# Patient Record
Sex: Male | Born: 1945 | Race: Black or African American | Hispanic: No | Marital: Married | Smoking: Never smoker
Health system: Southern US, Community
[De-identification: ages and names within clinical notes are randomized; demographics above are authoritative.]

## PROBLEM LIST (undated history)

## (undated) DIAGNOSIS — I639 Cerebral infarction, unspecified: Secondary | ICD-10-CM

## (undated) DIAGNOSIS — E785 Hyperlipidemia, unspecified: Secondary | ICD-10-CM

## (undated) DIAGNOSIS — N189 Chronic kidney disease, unspecified: Secondary | ICD-10-CM

## (undated) DIAGNOSIS — K8020 Calculus of gallbladder without cholecystitis without obstruction: Secondary | ICD-10-CM

## (undated) DIAGNOSIS — M1990 Unspecified osteoarthritis, unspecified site: Secondary | ICD-10-CM

## (undated) DIAGNOSIS — C649 Malignant neoplasm of unspecified kidney, except renal pelvis: Secondary | ICD-10-CM

## (undated) DIAGNOSIS — E1140 Type 2 diabetes mellitus with diabetic neuropathy, unspecified: Secondary | ICD-10-CM

## (undated) DIAGNOSIS — K635 Polyp of colon: Secondary | ICD-10-CM

## (undated) DIAGNOSIS — I10 Essential (primary) hypertension: Secondary | ICD-10-CM

## (undated) DIAGNOSIS — N1330 Unspecified hydronephrosis: Secondary | ICD-10-CM

## (undated) DIAGNOSIS — E119 Type 2 diabetes mellitus without complications: Secondary | ICD-10-CM

## (undated) HISTORY — PX: KIDNEY TRANSPLANT: SHX239

## (undated) HISTORY — PX: TONSILLECTOMY: SUR1361

## (undated) HISTORY — PX: CATARACT EXTRACTION: SUR2

## (undated) HISTORY — PX: NEPHRECTOMY: SHX65

## (undated) HISTORY — PX: CHOLECYSTECTOMY: SHX55

## (undated) HISTORY — PX: KNEE ARTHROSCOPY: SUR90

## (undated) HISTORY — PX: WISDOM TOOTH EXTRACTION: SHX21

## (undated) HISTORY — PX: COLONOSCOPY: SHX174

---

## 2004-05-24 ENCOUNTER — Other Ambulatory Visit: Payer: Self-pay

## 2004-09-21 ENCOUNTER — Ambulatory Visit: Payer: Self-pay | Admitting: Nephrology

## 2006-02-27 ENCOUNTER — Ambulatory Visit: Payer: Self-pay | Admitting: Gastroenterology

## 2006-05-15 ENCOUNTER — Ambulatory Visit: Payer: Self-pay | Admitting: Unknown Physician Specialty

## 2006-05-17 ENCOUNTER — Ambulatory Visit: Payer: Self-pay | Admitting: Unknown Physician Specialty

## 2006-05-17 ENCOUNTER — Other Ambulatory Visit: Payer: Self-pay

## 2006-05-20 ENCOUNTER — Ambulatory Visit: Payer: Self-pay | Admitting: Unknown Physician Specialty

## 2006-06-21 ENCOUNTER — Ambulatory Visit: Payer: Self-pay | Admitting: Unknown Physician Specialty

## 2006-12-11 ENCOUNTER — Encounter: Payer: Self-pay | Admitting: Transplant Surgery

## 2006-12-28 ENCOUNTER — Encounter: Payer: Self-pay | Admitting: Transplant Surgery

## 2007-01-28 ENCOUNTER — Encounter: Payer: Self-pay | Admitting: Transplant Surgery

## 2007-02-27 ENCOUNTER — Encounter: Payer: Self-pay | Admitting: Transplant Surgery

## 2009-08-23 ENCOUNTER — Ambulatory Visit: Payer: Self-pay | Admitting: Gastroenterology

## 2010-03-22 ENCOUNTER — Ambulatory Visit: Payer: Self-pay | Admitting: Internal Medicine

## 2010-03-28 ENCOUNTER — Other Ambulatory Visit: Payer: Self-pay

## 2010-04-09 ENCOUNTER — Emergency Department: Payer: Self-pay | Admitting: Emergency Medicine

## 2010-04-12 ENCOUNTER — Ambulatory Visit: Payer: Self-pay | Admitting: Emergency Medicine

## 2010-11-13 ENCOUNTER — Emergency Department: Payer: Self-pay | Admitting: Unknown Physician Specialty

## 2011-02-12 ENCOUNTER — Other Ambulatory Visit: Payer: Self-pay

## 2011-02-27 ENCOUNTER — Other Ambulatory Visit: Payer: Self-pay

## 2011-03-30 ENCOUNTER — Other Ambulatory Visit: Payer: Self-pay

## 2011-05-29 ENCOUNTER — Other Ambulatory Visit: Payer: Self-pay

## 2011-05-30 ENCOUNTER — Other Ambulatory Visit: Payer: Self-pay

## 2011-06-30 ENCOUNTER — Other Ambulatory Visit: Payer: Self-pay

## 2011-07-30 ENCOUNTER — Other Ambulatory Visit: Payer: Self-pay

## 2011-08-29 ENCOUNTER — Emergency Department: Payer: Self-pay | Admitting: *Deleted

## 2011-09-28 ENCOUNTER — Other Ambulatory Visit: Payer: Self-pay

## 2011-09-29 ENCOUNTER — Other Ambulatory Visit: Payer: Self-pay

## 2011-10-30 ENCOUNTER — Other Ambulatory Visit: Payer: Self-pay

## 2011-11-27 LAB — CBC WITH DIFFERENTIAL/PLATELET
Basophil #: 0 10*3/uL (ref 0.0–0.1)
HGB: 13.6 g/dL (ref 13.0–18.0)
Lymphocyte #: 1 10*3/uL (ref 1.0–3.6)
Lymphocyte %: 22.9 %
MCH: 26.9 pg (ref 26.0–34.0)
MCHC: 32.6 g/dL (ref 32.0–36.0)
MCV: 82 fL (ref 80–100)
Monocyte #: 0.8 10*3/uL — ABNORMAL HIGH (ref 0.0–0.7)
Monocyte %: 17.8 %
Neutrophil %: 57.4 %
Platelet: 172 10*3/uL (ref 150–440)
RBC: 5.07 10*6/uL (ref 4.40–5.90)
RDW: 13 % (ref 11.5–14.5)

## 2011-11-27 LAB — BASIC METABOLIC PANEL
BUN: 31 mg/dL — ABNORMAL HIGH (ref 7–18)
Chloride: 103 mmol/L (ref 98–107)
Creatinine: 1.41 mg/dL — ABNORMAL HIGH (ref 0.60–1.30)
EGFR (Non-African Amer.): 53 — ABNORMAL LOW
Glucose: 82 mg/dL (ref 65–99)
Potassium: 4.9 mmol/L (ref 3.5–5.1)
Sodium: 140 mmol/L (ref 136–145)

## 2011-11-27 LAB — PHOSPHORUS: Phosphorus: 3.2 mg/dL (ref 2.5–4.9)

## 2011-11-27 LAB — MAGNESIUM: Magnesium: 2.1 mg/dL

## 2011-11-30 ENCOUNTER — Other Ambulatory Visit: Payer: Self-pay

## 2012-01-30 ENCOUNTER — Other Ambulatory Visit: Payer: Self-pay

## 2012-01-30 LAB — CBC WITH DIFFERENTIAL/PLATELET
Basophil #: 0 10*3/uL (ref 0.0–0.1)
Basophil %: 0.7 %
Eosinophil #: 0.1 10*3/uL (ref 0.0–0.7)
Eosinophil %: 1.2 %
Lymphocyte #: 0.7 10*3/uL — ABNORMAL LOW (ref 1.0–3.6)
Lymphocyte %: 17.6 %
MCV: 83 fL (ref 80–100)
Monocyte %: 13.7 %
Neutrophil #: 2.8 10*3/uL (ref 1.4–6.5)
Platelet: 179 10*3/uL (ref 150–440)
RBC: 5.01 10*6/uL (ref 4.40–5.90)
RDW: 12.9 % (ref 11.5–14.5)
WBC: 4.2 10*3/uL (ref 3.8–10.6)

## 2012-01-30 LAB — BASIC METABOLIC PANEL
Calcium, Total: 9.6 mg/dL (ref 8.5–10.1)
Chloride: 103 mmol/L (ref 98–107)
Creatinine: 1.31 mg/dL — ABNORMAL HIGH (ref 0.60–1.30)
EGFR (African American): >60
EGFR (Non-African Amer.): 58 — ABNORMAL LOW
Sodium: 139 mmol/L (ref 136–145)

## 2012-01-30 LAB — LIPID PANEL
Cholesterol: 110 mg/dL (ref 0–200)
Ldl Cholesterol, Calc: 32 mg/dL (ref 0–100)

## 2012-01-30 LAB — HEPATIC FUNCTION PANEL A (ARMC)
Albumin: 4.3 g/dL (ref 3.4–5.0)
Bilirubin, Direct: 0.1 mg/dL (ref 0.00–0.20)
Bilirubin,Total: 0.5 mg/dL (ref 0.2–1.0)
SGPT (ALT): 19 U/L
Total Protein: 8 g/dL (ref 6.4–8.2)

## 2012-02-19 LAB — CBC WITH DIFFERENTIAL/PLATELET
Eosinophil #: 0 10*3/uL (ref 0.0–0.7)
HGB: 13.1 g/dL (ref 13.0–18.0)
Lymphocyte #: 0.9 10*3/uL — ABNORMAL LOW (ref 1.0–3.6)
Lymphocyte %: 21.5 %
MCHC: 31.6 g/dL — ABNORMAL LOW (ref 32.0–36.0)
MCV: 83 fL (ref 80–100)
Monocyte #: 0.6 x10 3/mm (ref 0.2–1.0)
Monocyte %: 14.7 %
Neutrophil #: 2.5 10*3/uL (ref 1.4–6.5)
Neutrophil %: 62 %
Platelet: 170 10*3/uL (ref 150–440)
RDW: 13.9 % (ref 11.5–14.5)
WBC: 4 10*3/uL (ref 3.8–10.6)

## 2012-02-19 LAB — BASIC METABOLIC PANEL
Anion Gap: 8 (ref 7–16)
BUN: 31 mg/dL — ABNORMAL HIGH (ref 7–18)
Chloride: 103 mmol/L (ref 98–107)
Co2: 26 mmol/L (ref 21–32)
EGFR (Non-African Amer.): 52 — ABNORMAL LOW
Glucose: 122 mg/dL — ABNORMAL HIGH (ref 65–99)
Potassium: 4.6 mmol/L (ref 3.5–5.1)
Sodium: 137 mmol/L (ref 136–145)

## 2012-02-19 LAB — PHOSPHORUS: Phosphorus: 2.4 mg/dL — ABNORMAL LOW (ref 2.5–4.9)

## 2012-02-27 ENCOUNTER — Other Ambulatory Visit: Payer: Self-pay

## 2012-03-26 LAB — LIPID PANEL
Cholesterol: 101 mg/dL (ref 0–200)
HDL Cholesterol: 53 mg/dL (ref 40–60)
Ldl Cholesterol, Calc: 31 mg/dL (ref 0–100)
VLDL Cholesterol, Calc: 17 mg/dL (ref 5–40)

## 2012-03-26 LAB — CBC WITH DIFFERENTIAL/PLATELET
Basophil %: 0.6 %
Eosinophil #: 0 10*3/uL (ref 0.0–0.7)
Eosinophil %: 0.6 %
Lymphocyte #: 0.7 10*3/uL — ABNORMAL LOW (ref 1.0–3.6)
Lymphocyte %: 17.1 %
MCH: 26.4 pg (ref 26.0–34.0)
Monocyte #: 0.5 x10 3/mm (ref 0.2–1.0)
Monocyte %: 12.7 %
Neutrophil %: 69 %
Platelet: 174 10*3/uL (ref 150–440)
RDW: 14 % (ref 11.5–14.5)
WBC: 4.2 10*3/uL (ref 3.8–10.6)

## 2012-03-26 LAB — BASIC METABOLIC PANEL
Anion Gap: 11 (ref 7–16)
BUN: 28 mg/dL — ABNORMAL HIGH (ref 7–18)
Calcium, Total: 9.4 mg/dL (ref 8.5–10.1)
Chloride: 104 mmol/L (ref 98–107)
Creatinine: 1.37 mg/dL — ABNORMAL HIGH (ref 0.60–1.30)
EGFR (African American): >60
EGFR (Non-African Amer.): 53 — ABNORMAL LOW
Glucose: 110 mg/dL — ABNORMAL HIGH (ref 65–99)
Osmolality: 282 (ref 275–301)
Potassium: 4 mmol/L (ref 3.5–5.1)

## 2012-03-26 LAB — HEPATIC FUNCTION PANEL A (ARMC)
Alkaline Phosphatase: 69 U/L (ref 50–136)
Bilirubin,Total: 0.4 mg/dL (ref 0.2–1.0)
SGOT(AST): 22 U/L (ref 15–37)
SGPT (ALT): 18 U/L
Total Protein: 7.9 g/dL (ref 6.4–8.2)

## 2012-03-26 LAB — PHOSPHORUS: Phosphorus: 3.3 mg/dL (ref 2.5–4.9)

## 2012-03-26 LAB — MAGNESIUM: Magnesium: 1.9 mg/dL

## 2012-03-29 ENCOUNTER — Other Ambulatory Visit: Payer: Self-pay

## 2012-05-20 ENCOUNTER — Other Ambulatory Visit: Payer: Self-pay

## 2012-05-20 LAB — CBC WITH DIFFERENTIAL/PLATELET
Basophil #: 0 10*3/uL (ref 0.0–0.1)
Basophil %: 0.9 %
Eosinophil %: 1.1 %
HCT: 41.8 % (ref 40.0–52.0)
HGB: 13.5 g/dL (ref 13.0–18.0)
Lymphocyte #: 0.8 10*3/uL — ABNORMAL LOW (ref 1.0–3.6)
Lymphocyte %: 21 %
MCHC: 32.4 g/dL (ref 32.0–36.0)
MCV: 82 fL (ref 80–100)
Monocyte #: 0.6 x10 3/mm (ref 0.2–1.0)
Neutrophil %: 62.7 %
Platelet: 166 10*3/uL (ref 150–440)
RBC: 5.11 10*6/uL (ref 4.40–5.90)
RDW: 14.2 % (ref 11.5–14.5)
WBC: 3.9 10*3/uL (ref 3.8–10.6)

## 2012-05-20 LAB — BASIC METABOLIC PANEL
BUN: 25 mg/dL — ABNORMAL HIGH (ref 7–18)
Calcium, Total: 9.1 mg/dL (ref 8.5–10.1)
EGFR (Non-African Amer.): >60
Glucose: 123 mg/dL — ABNORMAL HIGH (ref 65–99)
Osmolality: 281 (ref 275–301)
Potassium: 4.1 mmol/L (ref 3.5–5.1)
Sodium: 138 mmol/L (ref 136–145)

## 2012-05-20 LAB — PHOSPHORUS: Phosphorus: 2.6 mg/dL (ref 2.5–4.9)

## 2012-05-29 ENCOUNTER — Other Ambulatory Visit: Payer: Self-pay

## 2012-06-27 LAB — BASIC METABOLIC PANEL
Anion Gap: 7 (ref 7–16)
BUN: 19 mg/dL — ABNORMAL HIGH (ref 7–18)
Chloride: 109 mmol/L — ABNORMAL HIGH (ref 98–107)
Co2: 26 mmol/L (ref 21–32)
Creatinine: 1.1 mg/dL (ref 0.60–1.30)
Glucose: 72 mg/dL (ref 65–99)
Osmolality: 284 (ref 275–301)
Potassium: 4.3 mmol/L (ref 3.5–5.1)
Sodium: 142 mmol/L (ref 136–145)

## 2012-06-27 LAB — CBC WITH DIFFERENTIAL/PLATELET
Basophil #: 0 10*3/uL (ref 0.0–0.1)
Eosinophil #: 0 10*3/uL (ref 0.0–0.7)
Eosinophil %: 1.3 %
HCT: 41 % (ref 40.0–52.0)
Lymphocyte #: 0.8 10*3/uL — ABNORMAL LOW (ref 1.0–3.6)
MCV: 82 fL (ref 80–100)
Monocyte %: 17.1 %
Neutrophil %: 56.4 %
Platelet: 157 10*3/uL (ref 150–440)
RDW: 14.6 % — ABNORMAL HIGH (ref 11.5–14.5)
WBC: 3.5 10*3/uL — ABNORMAL LOW (ref 3.8–10.6)

## 2012-06-27 LAB — PHOSPHORUS: Phosphorus: 2.8 mg/dL (ref 2.5–4.9)

## 2012-06-29 ENCOUNTER — Other Ambulatory Visit: Payer: Self-pay

## 2012-07-22 LAB — LIPID PANEL
HDL Cholesterol: 58 mg/dL (ref 40–60)
Ldl Cholesterol, Calc: 39 mg/dL (ref 0–100)
Triglycerides: 108 mg/dL (ref 0–200)

## 2012-07-22 LAB — CBC WITH DIFFERENTIAL/PLATELET
Basophil %: 0.6 %
Eosinophil #: 0 10*3/uL (ref 0.0–0.7)
HCT: 42.7 % (ref 40.0–52.0)
HGB: 14.1 g/dL (ref 13.0–18.0)
Lymphocyte #: 0.8 10*3/uL — ABNORMAL LOW (ref 1.0–3.6)
Lymphocyte %: 17.8 %
MCH: 26.8 pg (ref 26.0–34.0)
MCHC: 32.9 g/dL (ref 32.0–36.0)
MCV: 81 fL (ref 80–100)
Monocyte #: 0.6 x10 3/mm (ref 0.2–1.0)
Neutrophil #: 3.2 10*3/uL (ref 1.4–6.5)
Platelet: 174 10*3/uL (ref 150–440)

## 2012-07-22 LAB — BASIC METABOLIC PANEL
Anion Gap: 12 (ref 7–16)
BUN: 27 mg/dL — ABNORMAL HIGH (ref 7–18)
Calcium, Total: 9.9 mg/dL (ref 8.5–10.1)
EGFR (African American): >60
EGFR (Non-African Amer.): 60 — ABNORMAL LOW
Glucose: 131 mg/dL — ABNORMAL HIGH (ref 65–99)
Osmolality: 284 (ref 275–301)
Potassium: 4.1 mmol/L (ref 3.5–5.1)

## 2012-07-22 LAB — HEPATIC FUNCTION PANEL A (ARMC)
Albumin: 4.3 g/dL (ref 3.4–5.0)
Alkaline Phosphatase: 83 U/L (ref 50–136)
Bilirubin, Direct: 0.2 mg/dL (ref 0.00–0.20)
Bilirubin,Total: 0.5 mg/dL (ref 0.2–1.0)
Total Protein: 7.6 g/dL (ref 6.4–8.2)

## 2012-07-22 LAB — MAGNESIUM: Magnesium: 1.8 mg/dL

## 2012-07-29 ENCOUNTER — Other Ambulatory Visit: Payer: Self-pay

## 2012-09-24 ENCOUNTER — Other Ambulatory Visit: Payer: Self-pay | Admitting: Nephrology

## 2012-09-24 LAB — BASIC METABOLIC PANEL
Anion Gap: 6 — ABNORMAL LOW (ref 7–16)
BUN: 24 mg/dL — ABNORMAL HIGH (ref 7–18)
Calcium, Total: 9.3 mg/dL (ref 8.5–10.1)
Chloride: 103 mmol/L (ref 98–107)
Creatinine: 1.21 mg/dL (ref 0.60–1.30)
EGFR (African American): >60
Glucose: 120 mg/dL — ABNORMAL HIGH (ref 65–99)
Osmolality: 275 (ref 275–301)

## 2012-09-24 LAB — PHOSPHORUS: Phosphorus: 2.6 mg/dL (ref 2.5–4.9)

## 2012-09-24 LAB — CBC WITH DIFFERENTIAL/PLATELET
Basophil #: 0 10*3/uL (ref 0.0–0.1)
Eosinophil #: 0 10*3/uL (ref 0.0–0.7)
HCT: 43.4 % (ref 40.0–52.0)
Lymphocyte #: 0.9 10*3/uL — ABNORMAL LOW (ref 1.0–3.6)
Lymphocyte %: 21.2 %
MCHC: 32.8 g/dL (ref 32.0–36.0)
MCV: 81 fL (ref 80–100)
Monocyte %: 17 %
Neutrophil #: 2.4 10*3/uL (ref 1.4–6.5)
Neutrophil %: 60 %
Platelet: 172 10*3/uL (ref 150–440)
RBC: 5.38 10*6/uL (ref 4.40–5.90)
RDW: 14 % (ref 11.5–14.5)

## 2012-09-24 LAB — MAGNESIUM: Magnesium: 1.8 mg/dL

## 2012-09-28 ENCOUNTER — Other Ambulatory Visit: Payer: Self-pay | Admitting: Nephrology

## 2012-10-17 LAB — CBC WITH DIFFERENTIAL/PLATELET
Basophil %: 1.1 %
Eosinophil #: 0.1 10*3/uL (ref 0.0–0.7)
Eosinophil %: 1.7 %
HCT: 43.4 % (ref 40.0–52.0)
MCHC: 32.6 g/dL (ref 32.0–36.0)
MCV: 81 fL (ref 80–100)
Monocyte %: 17.8 %
Neutrophil %: 54.1 %
RDW: 14 % (ref 11.5–14.5)

## 2012-10-17 LAB — COMPREHENSIVE METABOLIC PANEL
Albumin: 4.1 g/dL (ref 3.4–5.0)
Alkaline Phosphatase: 85 U/L (ref 50–136)
BUN: 28 mg/dL — ABNORMAL HIGH (ref 7–18)
Bilirubin,Total: 0.5 mg/dL (ref 0.2–1.0)
Calcium, Total: 9.6 mg/dL (ref 8.5–10.1)
Chloride: 104 mmol/L (ref 98–107)
Creatinine: 1.16 mg/dL (ref 0.60–1.30)
EGFR (Non-African Amer.): >60
Glucose: 96 mg/dL (ref 65–99)
Osmolality: 279 (ref 275–301)
Potassium: 4.2 mmol/L (ref 3.5–5.1)
SGPT (ALT): 20 U/L (ref 12–78)
Total Protein: 7.6 g/dL (ref 6.4–8.2)

## 2012-10-17 LAB — LIPID PANEL
Ldl Cholesterol, Calc: 27 mg/dL (ref 0–100)
Triglycerides: 110 mg/dL (ref 0–200)

## 2012-10-17 LAB — BILIRUBIN, DIRECT: Bilirubin, Direct: 0.1 mg/dL (ref 0.00–0.20)

## 2012-10-17 LAB — GAMMA GT: GGT: 20 U/L (ref 5–85)

## 2012-10-17 LAB — MAGNESIUM: Magnesium: 1.6 mg/dL — ABNORMAL LOW

## 2012-10-17 LAB — PHOSPHORUS: Phosphorus: 3.2 mg/dL (ref 2.5–4.9)

## 2012-10-29 ENCOUNTER — Other Ambulatory Visit: Payer: Self-pay | Admitting: Nephrology

## 2012-11-19 LAB — CBC WITH DIFFERENTIAL/PLATELET
Basophil #: 0 10*3/uL (ref 0.0–0.1)
Basophil %: 0.8 %
Eosinophil #: 0.1 10*3/uL (ref 0.0–0.7)
Eosinophil %: 1.9 %
HCT: 44 % (ref 40.0–52.0)
Lymphocyte %: 14.8 %
MCH: 26.2 pg (ref 26.0–34.0)
MCHC: 32.4 g/dL (ref 32.0–36.0)
Monocyte #: 1.2 x10 3/mm — ABNORMAL HIGH (ref 0.2–1.0)
Neutrophil #: 3.6 10*3/uL (ref 1.4–6.5)
Platelet: 165 10*3/uL (ref 150–440)
RDW: 14.3 % (ref 11.5–14.5)
WBC: 5.8 10*3/uL (ref 3.8–10.6)

## 2012-11-19 LAB — BASIC METABOLIC PANEL
Anion Gap: 7 (ref 7–16)
Co2: 27 mmol/L (ref 21–32)
Creatinine: 1.11 mg/dL (ref 0.60–1.30)
EGFR (African American): >60
Glucose: 85 mg/dL (ref 65–99)
Osmolality: 275 (ref 275–301)
Potassium: 3.9 mmol/L (ref 3.5–5.1)
Sodium: 137 mmol/L (ref 136–145)

## 2012-11-19 LAB — MAGNESIUM: Magnesium: 1.7 mg/dL — ABNORMAL LOW

## 2012-11-29 ENCOUNTER — Other Ambulatory Visit: Payer: Self-pay | Admitting: Nephrology

## 2013-01-19 ENCOUNTER — Other Ambulatory Visit: Payer: Self-pay | Admitting: Nephrology

## 2013-01-19 LAB — CBC WITH DIFFERENTIAL/PLATELET
Basophil %: 1.2 %
Eosinophil %: 1.3 %
HCT: 43.5 % (ref 40.0–52.0)
HGB: 13.8 g/dL (ref 13.0–18.0)
Lymphocyte #: 0.9 10*3/uL — ABNORMAL LOW (ref 1.0–3.6)
Lymphocyte %: 18.7 %
MCHC: 31.8 g/dL — ABNORMAL LOW (ref 32.0–36.0)
MCV: 80 fL (ref 80–100)
Monocyte %: 14.9 %
Neutrophil %: 63.9 %
RBC: 5.41 10*6/uL (ref 4.40–5.90)
WBC: 4.6 10*3/uL (ref 3.8–10.6)

## 2013-01-19 LAB — PHOSPHORUS: Phosphorus: 2.8 mg/dL (ref 2.5–4.9)

## 2013-01-19 LAB — BASIC METABOLIC PANEL
BUN: 35 mg/dL — ABNORMAL HIGH (ref 7–18)
Calcium, Total: 9.2 mg/dL (ref 8.5–10.1)
Co2: 29 mmol/L (ref 21–32)
Glucose: 107 mg/dL — ABNORMAL HIGH (ref 65–99)
Osmolality: 279 (ref 275–301)
Potassium: 4.6 mmol/L (ref 3.5–5.1)
Sodium: 135 mmol/L — ABNORMAL LOW (ref 136–145)

## 2013-01-27 ENCOUNTER — Other Ambulatory Visit: Payer: Self-pay | Admitting: Nephrology

## 2013-02-12 LAB — CBC WITH DIFFERENTIAL/PLATELET
Eosinophil %: 1.4 %
HCT: 42.4 % (ref 40.0–52.0)
HGB: 13.6 g/dL (ref 13.0–18.0)
Lymphocyte #: 0.9 10*3/uL — ABNORMAL LOW (ref 1.0–3.6)
Lymphocyte %: 20.3 %
MCH: 25.8 pg — ABNORMAL LOW (ref 26.0–34.0)
MCHC: 32 g/dL (ref 32.0–36.0)
Monocyte #: 0.8 x10 3/mm (ref 0.2–1.0)
Monocyte %: 16.3 %
Neutrophil #: 2.8 10*3/uL (ref 1.4–6.5)
RDW: 14.9 % — ABNORMAL HIGH (ref 11.5–14.5)

## 2013-02-12 LAB — COMPREHENSIVE METABOLIC PANEL
Albumin: 3.9 g/dL (ref 3.4–5.0)
Alkaline Phosphatase: 74 U/L (ref 50–136)
Anion Gap: 4 — ABNORMAL LOW (ref 7–16)
Bilirubin,Total: 0.6 mg/dL (ref 0.2–1.0)
Creatinine: 1.08 mg/dL (ref 0.60–1.30)
EGFR (African American): >60
Osmolality: 276 (ref 275–301)
Potassium: 4.3 mmol/L (ref 3.5–5.1)
SGPT (ALT): 20 U/L (ref 12–78)
Total Protein: 7.6 g/dL (ref 6.4–8.2)

## 2013-02-12 LAB — BILIRUBIN, DIRECT: Bilirubin, Direct: 0.2 mg/dL (ref 0.00–0.20)

## 2013-02-12 LAB — LIPID PANEL
Cholesterol: 93 mg/dL (ref 0–200)
Triglycerides: 67 mg/dL (ref 0–200)
VLDL Cholesterol, Calc: 13 mg/dL (ref 5–40)

## 2013-02-12 LAB — MAGNESIUM: Magnesium: 1.8 mg/dL

## 2013-02-26 ENCOUNTER — Other Ambulatory Visit: Payer: Self-pay | Admitting: Nephrology

## 2013-03-16 LAB — CBC WITH DIFFERENTIAL/PLATELET
Basophil #: 0 10*3/uL (ref 0.0–0.1)
Basophil %: 0.3 %
Lymphocyte #: 0.9 10*3/uL — ABNORMAL LOW (ref 1.0–3.6)
Monocyte #: 0.7 x10 3/mm (ref 0.2–1.0)
Neutrophil #: 2 10*3/uL (ref 1.4–6.5)
Platelet: 215 10*3/uL (ref 150–440)
RBC: 5.54 10*6/uL (ref 4.40–5.90)
RDW: 14.5 % (ref 11.5–14.5)
WBC: 3.7 10*3/uL — ABNORMAL LOW (ref 3.8–10.6)

## 2013-03-16 LAB — BASIC METABOLIC PANEL
Chloride: 103 mmol/L (ref 98–107)
Co2: 29 mmol/L (ref 21–32)
Creatinine: 1.15 mg/dL (ref 0.60–1.30)
EGFR (Non-African Amer.): >60
Osmolality: 279 (ref 275–301)
Potassium: 3.9 mmol/L (ref 3.5–5.1)
Sodium: 138 mmol/L (ref 136–145)

## 2013-03-24 LAB — BASIC METABOLIC PANEL
BUN: 36 mg/dL — ABNORMAL HIGH (ref 7–18)
Calcium, Total: 9.3 mg/dL (ref 8.5–10.1)
Co2: 26 mmol/L (ref 21–32)
EGFR (African American): >60
Potassium: 3.5 mmol/L (ref 3.5–5.1)

## 2013-03-24 LAB — CBC WITH DIFFERENTIAL/PLATELET
Basophil #: 0 10*3/uL (ref 0.0–0.1)
Eosinophil #: 0.1 10*3/uL (ref 0.0–0.7)
HGB: 13.5 g/dL (ref 13.0–18.0)
Lymphocyte %: 16.6 %
MCH: 26 pg (ref 26.0–34.0)
MCHC: 33.1 g/dL (ref 32.0–36.0)
Monocyte #: 0.9 x10 3/mm (ref 0.2–1.0)
Monocyte %: 18.6 %
Neutrophil %: 63.1 %
RBC: 5.19 10*6/uL (ref 4.40–5.90)
WBC: 5 10*3/uL (ref 3.8–10.6)

## 2013-03-25 LAB — CLOSTRIDIUM DIFFICILE BY PCR

## 2013-03-26 LAB — WBCS, STOOL

## 2013-03-27 LAB — STOOL CULTURE

## 2013-03-29 ENCOUNTER — Other Ambulatory Visit: Payer: Self-pay | Admitting: Nephrology

## 2013-04-29 ENCOUNTER — Ambulatory Visit: Payer: Self-pay | Admitting: Gastroenterology

## 2013-04-30 LAB — PATHOLOGY REPORT

## 2013-05-28 ENCOUNTER — Other Ambulatory Visit: Payer: Self-pay | Admitting: Nephrology

## 2013-05-28 LAB — CBC WITH DIFFERENTIAL/PLATELET
Eosinophil #: 0.1 10*3/uL (ref 0.0–0.7)
Eosinophil %: 2.1 %
HCT: 40.7 % (ref 40.0–52.0)
Lymphocyte #: 0.6 10*3/uL — ABNORMAL LOW (ref 1.0–3.6)
MCV: 81 fL (ref 80–100)
Monocyte #: 0.8 x10 3/mm (ref 0.2–1.0)
Neutrophil #: 2.1 10*3/uL (ref 1.4–6.5)
Neutrophil %: 59 %
Platelet: 151 10*3/uL (ref 150–440)
WBC: 3.6 10*3/uL — ABNORMAL LOW (ref 3.8–10.6)

## 2013-05-28 LAB — BASIC METABOLIC PANEL
Anion Gap: 3 — ABNORMAL LOW (ref 7–16)
BUN: 18 mg/dL (ref 7–18)
Calcium, Total: 9.3 mg/dL (ref 8.5–10.1)
Co2: 31 mmol/L (ref 21–32)
Creatinine: 1.2 mg/dL (ref 0.60–1.30)
EGFR (African American): >60
Glucose: 86 mg/dL (ref 65–99)

## 2013-05-28 LAB — MAGNESIUM: Magnesium: 1.5 mg/dL — ABNORMAL LOW

## 2013-05-28 LAB — PHOSPHORUS: Phosphorus: 3.1 mg/dL (ref 2.5–4.9)

## 2013-05-29 ENCOUNTER — Other Ambulatory Visit: Payer: Self-pay | Admitting: Nephrology

## 2013-06-22 LAB — LIPID PANEL
Cholesterol: 112 mg/dL (ref 0–200)
HDL Cholesterol: 59 mg/dL (ref 40–60)
Triglycerides: 117 mg/dL (ref 0–200)
VLDL Cholesterol, Calc: 23 mg/dL (ref 5–40)

## 2013-06-22 LAB — CBC WITH DIFFERENTIAL/PLATELET
Eosinophil %: 1.5 %
HCT: 43.8 % (ref 40.0–52.0)
HGB: 14.6 g/dL (ref 13.0–18.0)
Lymphocyte #: 1.1 10*3/uL (ref 1.0–3.6)
Lymphocyte %: 26.9 %
MCH: 26.7 pg (ref 26.0–34.0)
MCV: 81 fL (ref 80–100)
Monocyte %: 16 %
RBC: 5.44 10*6/uL (ref 4.40–5.90)
RDW: 15.1 % — ABNORMAL HIGH (ref 11.5–14.5)
WBC: 4 10*3/uL (ref 3.8–10.6)

## 2013-06-22 LAB — COMPREHENSIVE METABOLIC PANEL
Albumin: 4.1 g/dL (ref 3.4–5.0)
Calcium, Total: 9.7 mg/dL (ref 8.5–10.1)
Chloride: 104 mmol/L (ref 98–107)
Co2: 29 mmol/L (ref 21–32)
EGFR (African American): >60
EGFR (Non-African Amer.): >60
Osmolality: 274 (ref 275–301)
Potassium: 4.6 mmol/L (ref 3.5–5.1)
SGOT(AST): 34 U/L (ref 15–37)
SGPT (ALT): 27 U/L (ref 12–78)
Sodium: 136 mmol/L (ref 136–145)
Total Protein: 7.6 g/dL (ref 6.4–8.2)

## 2013-06-22 LAB — GAMMA GT: GGT: 35 U/L (ref 5–85)

## 2013-06-22 LAB — MAGNESIUM: Magnesium: 1.7 mg/dL — ABNORMAL LOW

## 2013-06-22 LAB — PHOSPHORUS: Phosphorus: 2.9 mg/dL (ref 2.5–4.9)

## 2013-06-29 ENCOUNTER — Other Ambulatory Visit: Payer: Self-pay | Admitting: Nephrology

## 2013-08-24 ENCOUNTER — Other Ambulatory Visit: Payer: Self-pay | Admitting: Nephrology

## 2013-08-24 LAB — CBC WITH DIFFERENTIAL/PLATELET
Basophil #: 0 10*3/uL (ref 0.0–0.1)
HCT: 45.7 % (ref 40.0–52.0)
Lymphocyte #: 1 10*3/uL (ref 1.0–3.6)
MCH: 26.4 pg (ref 26.0–34.0)
MCHC: 32.5 g/dL (ref 32.0–36.0)
MCV: 81 fL (ref 80–100)
Monocyte #: 0.6 x10 3/mm (ref 0.2–1.0)
Platelet: 163 10*3/uL (ref 150–440)
RDW: 13.8 % (ref 11.5–14.5)
WBC: 3.8 10*3/uL (ref 3.8–10.6)

## 2013-08-24 LAB — BASIC METABOLIC PANEL
Chloride: 106 mmol/L (ref 98–107)
Potassium: 4.2 mmol/L (ref 3.5–5.1)

## 2013-08-24 LAB — PHOSPHORUS: Phosphorus: 2.4 mg/dL — ABNORMAL LOW (ref 2.5–4.9)

## 2013-08-29 ENCOUNTER — Other Ambulatory Visit: Payer: Self-pay | Admitting: Nephrology

## 2013-09-16 LAB — CBC WITH DIFFERENTIAL/PLATELET
Basophil #: 0 10*3/uL (ref 0.0–0.1)
Eosinophil %: 1.8 %
Lymphocyte #: 1 10*3/uL (ref 1.0–3.6)
Lymphocyte %: 22.6 %
MCH: 26.1 pg (ref 26.0–34.0)
MCHC: 32.4 g/dL (ref 32.0–36.0)
MCV: 81 fL (ref 80–100)
Monocyte %: 15.2 %
Neutrophil #: 2.7 10*3/uL (ref 1.4–6.5)
RDW: 14.1 % (ref 11.5–14.5)
WBC: 4.5 10*3/uL (ref 3.8–10.6)

## 2013-09-16 LAB — COMPREHENSIVE METABOLIC PANEL
Anion Gap: 5 — ABNORMAL LOW (ref 7–16)
BUN: 25 mg/dL — ABNORMAL HIGH (ref 7–18)
Creatinine: 1.35 mg/dL — ABNORMAL HIGH (ref 0.60–1.30)
EGFR (African American): >60
EGFR (Non-African Amer.): 54 — ABNORMAL LOW
Glucose: 85 mg/dL (ref 65–99)
Osmolality: 276 (ref 275–301)
Potassium: 4.2 mmol/L (ref 3.5–5.1)
SGPT (ALT): 25 U/L (ref 12–78)
Sodium: 136 mmol/L (ref 136–145)
Total Protein: 8 g/dL (ref 6.4–8.2)

## 2013-09-16 LAB — LIPID PANEL
Cholesterol: 97 mg/dL (ref 0–200)
Triglycerides: 73 mg/dL (ref 0–200)
VLDL Cholesterol, Calc: 15 mg/dL (ref 5–40)

## 2013-09-16 LAB — MAGNESIUM: Magnesium: 1.9 mg/dL

## 2013-09-16 LAB — BILIRUBIN, DIRECT: Bilirubin, Direct: 0.2 mg/dL (ref 0.00–0.20)

## 2013-10-18 ENCOUNTER — Emergency Department: Payer: Self-pay | Admitting: Emergency Medicine

## 2013-10-18 LAB — BASIC METABOLIC PANEL
BUN: 24 mg/dL — ABNORMAL HIGH (ref 7–18)
Calcium, Total: 9.4 mg/dL (ref 8.5–10.1)
Chloride: 103 mmol/L (ref 98–107)
Co2: 26 mmol/L (ref 21–32)
Creatinine: 1.33 mg/dL — ABNORMAL HIGH (ref 0.60–1.30)
EGFR (Non-African Amer.): 55 — ABNORMAL LOW
Potassium: 4.2 mmol/L (ref 3.5–5.1)
Sodium: 135 mmol/L — ABNORMAL LOW (ref 136–145)

## 2013-10-18 LAB — CBC
HCT: 42.7 % (ref 40.0–52.0)
MCH: 25.8 pg — ABNORMAL LOW (ref 26.0–34.0)
MCHC: 31.8 g/dL — ABNORMAL LOW (ref 32.0–36.0)
MCV: 81 fL (ref 80–100)
RDW: 13.9 % (ref 11.5–14.5)

## 2013-10-18 LAB — TROPONIN I: Troponin-I: <0.02 ng/mL

## 2013-10-18 LAB — MAGNESIUM: Magnesium: 1.6 mg/dL — ABNORMAL LOW

## 2013-10-19 ENCOUNTER — Other Ambulatory Visit: Payer: Self-pay | Admitting: Nephrology

## 2013-10-19 LAB — CBC WITH DIFFERENTIAL/PLATELET
Eosinophil #: 0.1 10*3/uL (ref 0.0–0.7)
Eosinophil %: 2.2 %
HCT: 47.9 % (ref 40.0–52.0)
Lymphocyte #: 1.3 10*3/uL (ref 1.0–3.6)
Lymphocyte %: 24.1 %
MCH: 26.7 pg (ref 26.0–34.0)
MCHC: 32.6 g/dL (ref 32.0–36.0)
MCV: 82 fL (ref 80–100)
Neutrophil #: 3.3 10*3/uL (ref 1.4–6.5)
RDW: 14.4 % (ref 11.5–14.5)
WBC: 5.6 10*3/uL (ref 3.8–10.6)

## 2013-10-19 LAB — PHOSPHORUS: Phosphorus: 3.2 mg/dL (ref 2.5–4.9)

## 2013-10-19 LAB — MAGNESIUM: Magnesium: 1.8 mg/dL

## 2013-10-29 ENCOUNTER — Other Ambulatory Visit: Payer: Self-pay | Admitting: Nephrology

## 2013-12-23 ENCOUNTER — Other Ambulatory Visit: Payer: Self-pay | Admitting: Nephrology

## 2013-12-23 LAB — CBC WITH DIFFERENTIAL/PLATELET
BASOS PCT: 0.8 %
Basophil #: 0 10*3/uL (ref 0.0–0.1)
EOS ABS: 0.1 10*3/uL (ref 0.0–0.7)
Eosinophil %: 1.7 %
HCT: 46.2 % (ref 40.0–52.0)
HGB: 14.7 g/dL (ref 13.0–18.0)
Lymphocyte #: 1.2 10*3/uL (ref 1.0–3.6)
Lymphocyte %: 27.7 %
MCH: 26.3 pg (ref 26.0–34.0)
MCHC: 31.8 g/dL — ABNORMAL LOW (ref 32.0–36.0)
MCV: 83 fL (ref 80–100)
MONO ABS: 0.6 x10 3/mm (ref 0.2–1.0)
MONOS PCT: 14.9 %
NEUTROS ABS: 2.4 10*3/uL (ref 1.4–6.5)
Neutrophil %: 54.9 %
Platelet: 180 10*3/uL (ref 150–440)
RBC: 5.58 10*6/uL (ref 4.40–5.90)
RDW: 14.3 % (ref 11.5–14.5)
WBC: 4.3 10*3/uL (ref 3.8–10.6)

## 2013-12-23 LAB — COMPREHENSIVE METABOLIC PANEL
AST: 18 U/L (ref 15–37)
Albumin: 4.2 g/dL (ref 3.4–5.0)
Alkaline Phosphatase: 81 U/L
Anion Gap: 5 — ABNORMAL LOW (ref 7–16)
BUN: 25 mg/dL — ABNORMAL HIGH (ref 7–18)
Bilirubin,Total: 0.7 mg/dL (ref 0.2–1.0)
CO2: 29 mmol/L (ref 21–32)
Calcium, Total: 9.7 mg/dL (ref 8.5–10.1)
Chloride: 103 mmol/L (ref 98–107)
Creatinine: 1.22 mg/dL (ref 0.60–1.30)
EGFR (African American): >60
EGFR (Non-African Amer.): >60
GLUCOSE: 87 mg/dL (ref 65–99)
Osmolality: 278 (ref 275–301)
POTASSIUM: 4.5 mmol/L (ref 3.5–5.1)
SGPT (ALT): 19 U/L (ref 12–78)
SODIUM: 137 mmol/L (ref 136–145)
Total Protein: 8.3 g/dL — ABNORMAL HIGH (ref 6.4–8.2)

## 2013-12-23 LAB — GAMMA GT: GGT: 22 U/L (ref 5–85)

## 2013-12-23 LAB — PHOSPHORUS: PHOSPHORUS: 3.1 mg/dL (ref 2.5–4.9)

## 2013-12-23 LAB — LIPID PANEL
Cholesterol: 120 mg/dL (ref 0–200)
HDL Cholesterol: 60 mg/dL (ref 40–60)
Ldl Cholesterol, Calc: 43 mg/dL (ref 0–100)
Triglycerides: 86 mg/dL (ref 0–200)
VLDL Cholesterol, Calc: 17 mg/dL (ref 5–40)

## 2013-12-23 LAB — BILIRUBIN, DIRECT: Bilirubin, Direct: 0.2 mg/dL (ref 0.00–0.20)

## 2013-12-23 LAB — MAGNESIUM: MAGNESIUM: 1.8 mg/dL

## 2013-12-27 ENCOUNTER — Other Ambulatory Visit: Payer: Self-pay | Admitting: Nephrology

## 2014-01-21 LAB — BASIC METABOLIC PANEL
Anion Gap: 3 — ABNORMAL LOW (ref 7–16)
BUN: 35 mg/dL — AB (ref 7–18)
CALCIUM: 9.5 mg/dL (ref 8.5–10.1)
CHLORIDE: 104 mmol/L (ref 98–107)
Co2: 28 mmol/L (ref 21–32)
Creatinine: 1.36 mg/dL — ABNORMAL HIGH (ref 0.60–1.30)
EGFR (African American): >60
GFR CALC NON AF AMER: 53 — AB
Glucose: 87 mg/dL (ref 65–99)
Osmolality: 277 (ref 275–301)
POTASSIUM: 4.5 mmol/L (ref 3.5–5.1)
SODIUM: 135 mmol/L — AB (ref 136–145)

## 2014-01-21 LAB — CBC WITH DIFFERENTIAL/PLATELET
Basophil #: 0 10*3/uL (ref 0.0–0.1)
Basophil %: 0.7 %
EOS PCT: 1.8 %
Eosinophil #: 0.1 10*3/uL (ref 0.0–0.7)
HCT: 43.4 % (ref 40.0–52.0)
HGB: 13.9 g/dL (ref 13.0–18.0)
Lymphocyte #: 1.1 10*3/uL (ref 1.0–3.6)
Lymphocyte %: 22.7 %
MCH: 26.6 pg (ref 26.0–34.0)
MCHC: 32.2 g/dL (ref 32.0–36.0)
MCV: 83 fL (ref 80–100)
Monocyte #: 0.7 x10 3/mm (ref 0.2–1.0)
Monocyte %: 15.1 %
Neutrophil #: 2.9 10*3/uL (ref 1.4–6.5)
Neutrophil %: 59.7 %
Platelet: 161 10*3/uL (ref 150–440)
RBC: 5.25 10*6/uL (ref 4.40–5.90)
RDW: 14.2 % (ref 11.5–14.5)
WBC: 4.8 10*3/uL (ref 3.8–10.6)

## 2014-01-21 LAB — PHOSPHORUS: PHOSPHORUS: 2.9 mg/dL (ref 2.5–4.9)

## 2014-01-21 LAB — MAGNESIUM: MAGNESIUM: 2 mg/dL

## 2014-01-27 ENCOUNTER — Other Ambulatory Visit: Payer: Self-pay | Admitting: Nephrology

## 2014-02-23 LAB — CBC WITH DIFFERENTIAL/PLATELET
Basophil #: 0 10*3/uL (ref 0.0–0.1)
Basophil %: 0.4 %
EOS ABS: 0 10*3/uL (ref 0.0–0.7)
Eosinophil %: 0.8 %
HCT: 44.9 % (ref 40.0–52.0)
HGB: 14.4 g/dL (ref 13.0–18.0)
LYMPHS ABS: 0.9 10*3/uL — AB (ref 1.0–3.6)
LYMPHS PCT: 17.1 %
MCH: 26.8 pg (ref 26.0–34.0)
MCHC: 32 g/dL (ref 32.0–36.0)
MCV: 84 fL (ref 80–100)
MONO ABS: 0.6 x10 3/mm (ref 0.2–1.0)
MONOS PCT: 12.2 %
Neutrophil #: 3.7 10*3/uL (ref 1.4–6.5)
Neutrophil %: 69.5 %
Platelet: 157 10*3/uL (ref 150–440)
RBC: 5.37 10*6/uL (ref 4.40–5.90)
RDW: 14.3 % (ref 11.5–14.5)
WBC: 5.3 10*3/uL (ref 3.8–10.6)

## 2014-02-23 LAB — BASIC METABOLIC PANEL
ANION GAP: 5 — AB (ref 7–16)
BUN: 29 mg/dL — ABNORMAL HIGH (ref 7–18)
Calcium, Total: 9.3 mg/dL (ref 8.5–10.1)
Chloride: 103 mmol/L (ref 98–107)
Co2: 28 mmol/L (ref 21–32)
Creatinine: 1.16 mg/dL (ref 0.60–1.30)
EGFR (African American): >60
EGFR (Non-African Amer.): >60
GLUCOSE: 163 mg/dL — AB (ref 65–99)
Osmolality: 281 (ref 275–301)
Potassium: 4.4 mmol/L (ref 3.5–5.1)
SODIUM: 136 mmol/L (ref 136–145)

## 2014-02-23 LAB — PHOSPHORUS: PHOSPHORUS: 3 mg/dL (ref 2.5–4.9)

## 2014-02-23 LAB — MAGNESIUM: Magnesium: 1.8 mg/dL

## 2014-02-26 ENCOUNTER — Other Ambulatory Visit: Payer: Self-pay | Admitting: Nephrology

## 2014-03-23 LAB — COMPREHENSIVE METABOLIC PANEL
ALK PHOS: 71 U/L
ANION GAP: 6 — AB (ref 7–16)
Albumin: 3.7 g/dL (ref 3.4–5.0)
BUN: 32 mg/dL — ABNORMAL HIGH (ref 7–18)
Bilirubin,Total: 0.4 mg/dL (ref 0.2–1.0)
CHLORIDE: 104 mmol/L (ref 98–107)
Calcium, Total: 9.3 mg/dL (ref 8.5–10.1)
Co2: 27 mmol/L (ref 21–32)
Creatinine: 1.47 mg/dL — ABNORMAL HIGH (ref 0.60–1.30)
GFR CALC AF AMER: 56 — AB
GFR CALC NON AF AMER: 48 — AB
Glucose: 81 mg/dL (ref 65–99)
Osmolality: 280 (ref 275–301)
POTASSIUM: 4.3 mmol/L (ref 3.5–5.1)
SGOT(AST): 28 U/L (ref 15–37)
SGPT (ALT): 19 U/L (ref 12–78)
Sodium: 137 mmol/L (ref 136–145)
Total Protein: 7.7 g/dL (ref 6.4–8.2)

## 2014-03-23 LAB — CBC WITH DIFFERENTIAL/PLATELET
BASOS ABS: 0 10*3/uL (ref 0.0–0.1)
BASOS PCT: 0.9 %
Eosinophil #: 0.1 10*3/uL (ref 0.0–0.7)
Eosinophil %: 1.3 %
HCT: 41.6 % (ref 40.0–52.0)
HGB: 13.4 g/dL (ref 13.0–18.0)
Lymphocyte #: 1 10*3/uL (ref 1.0–3.6)
Lymphocyte %: 20.1 %
MCH: 26.7 pg (ref 26.0–34.0)
MCHC: 32.1 g/dL (ref 32.0–36.0)
MCV: 83 fL (ref 80–100)
MONOS PCT: 14.5 %
Monocyte #: 0.7 x10 3/mm (ref 0.2–1.0)
Neutrophil #: 3.2 10*3/uL (ref 1.4–6.5)
Neutrophil %: 63.2 %
Platelet: 198 10*3/uL (ref 150–440)
RBC: 5 10*6/uL (ref 4.40–5.90)
RDW: 13.6 % (ref 11.5–14.5)
WBC: 5.1 10*3/uL (ref 3.8–10.6)

## 2014-03-23 LAB — BILIRUBIN, DIRECT: Bilirubin, Direct: 0.1 mg/dL (ref 0.00–0.20)

## 2014-03-23 LAB — PHOSPHORUS: PHOSPHORUS: 3.3 mg/dL (ref 2.5–4.9)

## 2014-03-23 LAB — LIPID PANEL
CHOLESTEROL: 92 mg/dL (ref 0–200)
HDL: 55 mg/dL (ref 40–60)
LDL CHOLESTEROL, CALC: 25 mg/dL (ref 0–100)
Triglycerides: 62 mg/dL (ref 0–200)
VLDL CHOLESTEROL, CALC: 12 mg/dL (ref 5–40)

## 2014-03-23 LAB — GAMMA GT: GGT: 20 U/L (ref 5–85)

## 2014-03-23 LAB — MAGNESIUM: Magnesium: 1.9 mg/dL

## 2014-03-29 ENCOUNTER — Other Ambulatory Visit: Payer: Self-pay | Admitting: Nephrology

## 2014-04-22 LAB — BASIC METABOLIC PANEL
ANION GAP: 3 — AB (ref 7–16)
BUN: 26 mg/dL — AB (ref 7–18)
Calcium, Total: 9.6 mg/dL (ref 8.5–10.1)
Chloride: 103 mmol/L (ref 98–107)
Co2: 30 mmol/L (ref 21–32)
Creatinine: 1.44 mg/dL — ABNORMAL HIGH (ref 0.60–1.30)
GFR CALC AF AMER: 58 — AB
GFR CALC NON AF AMER: 50 — AB
Glucose: 95 mg/dL (ref 65–99)
Osmolality: 277 (ref 275–301)
Potassium: 4.2 mmol/L (ref 3.5–5.1)
Sodium: 136 mmol/L (ref 136–145)

## 2014-04-22 LAB — CBC WITH DIFFERENTIAL/PLATELET
BASOS ABS: 0 10*3/uL (ref 0.0–0.1)
BASOS PCT: 0.6 %
Eosinophil #: 0.1 10*3/uL (ref 0.0–0.7)
Eosinophil %: 2 %
HCT: 44.5 % (ref 40.0–52.0)
HGB: 14.3 g/dL (ref 13.0–18.0)
Lymphocyte #: 1.1 10*3/uL (ref 1.0–3.6)
Lymphocyte %: 23.6 %
MCH: 26.8 pg (ref 26.0–34.0)
MCHC: 32.2 g/dL (ref 32.0–36.0)
MCV: 83 fL (ref 80–100)
MONO ABS: 0.7 x10 3/mm (ref 0.2–1.0)
Monocyte %: 14.2 %
Neutrophil #: 2.7 10*3/uL (ref 1.4–6.5)
Neutrophil %: 59.6 %
Platelet: 192 10*3/uL (ref 150–440)
RBC: 5.35 10*6/uL (ref 4.40–5.90)
RDW: 14.3 % (ref 11.5–14.5)
WBC: 4.6 10*3/uL (ref 3.8–10.6)

## 2014-04-22 LAB — PHOSPHORUS: PHOSPHORUS: 2.9 mg/dL (ref 2.5–4.9)

## 2014-04-22 LAB — MAGNESIUM: Magnesium: 1.8 mg/dL

## 2014-05-20 ENCOUNTER — Other Ambulatory Visit: Payer: Self-pay | Admitting: Nephrology

## 2014-05-20 LAB — BASIC METABOLIC PANEL
Anion Gap: 5 — ABNORMAL LOW (ref 7–16)
BUN: 27 mg/dL — AB (ref 7–18)
CREATININE: 1.48 mg/dL — AB (ref 0.60–1.30)
Calcium, Total: 9.2 mg/dL (ref 8.5–10.1)
Chloride: 107 mmol/L (ref 98–107)
Co2: 27 mmol/L (ref 21–32)
EGFR (African American): 56 — ABNORMAL LOW
GFR CALC NON AF AMER: 48 — AB
Glucose: 97 mg/dL (ref 65–99)
OSMOLALITY: 283 (ref 275–301)
Potassium: 4.5 mmol/L (ref 3.5–5.1)
Sodium: 139 mmol/L (ref 136–145)

## 2014-05-20 LAB — CBC WITH DIFFERENTIAL/PLATELET
Basophil #: 0 10*3/uL (ref 0.0–0.1)
Basophil %: 0.7 %
Eosinophil #: 0.1 10*3/uL (ref 0.0–0.7)
Eosinophil %: 2.1 %
HCT: 43.1 % (ref 40.0–52.0)
HGB: 13.4 g/dL (ref 13.0–18.0)
Lymphocyte #: 1 10*3/uL (ref 1.0–3.6)
Lymphocyte %: 25.1 %
MCH: 26.3 pg (ref 26.0–34.0)
MCHC: 31.2 g/dL — ABNORMAL LOW (ref 32.0–36.0)
MCV: 84 fL (ref 80–100)
MONO ABS: 0.8 x10 3/mm (ref 0.2–1.0)
Monocyte %: 18.6 %
NEUTROS ABS: 2.2 10*3/uL (ref 1.4–6.5)
Neutrophil %: 53.5 %
PLATELETS: 165 10*3/uL (ref 150–440)
RBC: 5.11 10*6/uL (ref 4.40–5.90)
RDW: 13.9 % (ref 11.5–14.5)
WBC: 4.2 10*3/uL (ref 3.8–10.6)

## 2014-05-20 LAB — MAGNESIUM: MAGNESIUM: 2 mg/dL

## 2014-05-20 LAB — PHOSPHORUS: Phosphorus: 3 mg/dL (ref 2.5–4.9)

## 2014-05-29 ENCOUNTER — Other Ambulatory Visit: Payer: Self-pay | Admitting: Nephrology

## 2014-06-22 LAB — CBC WITH DIFFERENTIAL/PLATELET
BASOS ABS: 0 10*3/uL (ref 0.0–0.1)
Basophil %: 0.8 %
EOS ABS: 0.1 10*3/uL (ref 0.0–0.7)
EOS PCT: 1.6 %
HCT: 42.2 % (ref 40.0–52.0)
HGB: 13.2 g/dL (ref 13.0–18.0)
Lymphocyte #: 0.8 10*3/uL — ABNORMAL LOW (ref 1.0–3.6)
Lymphocyte %: 15.3 %
MCH: 26.3 pg (ref 26.0–34.0)
MCHC: 31.2 g/dL — ABNORMAL LOW (ref 32.0–36.0)
MCV: 84 fL (ref 80–100)
MONOS PCT: 16.7 %
Monocyte #: 0.9 x10 3/mm (ref 0.2–1.0)
NEUTROS PCT: 65.6 %
Neutrophil #: 3.6 10*3/uL (ref 1.4–6.5)
PLATELETS: 170 10*3/uL (ref 150–440)
RBC: 5.02 10*6/uL (ref 4.40–5.90)
RDW: 14 % (ref 11.5–14.5)
WBC: 5.4 10*3/uL (ref 3.8–10.6)

## 2014-06-22 LAB — COMPREHENSIVE METABOLIC PANEL
Albumin: 3.8 g/dL (ref 3.4–5.0)
Alkaline Phosphatase: 65 U/L
Anion Gap: 11 (ref 7–16)
BILIRUBIN TOTAL: 0.4 mg/dL (ref 0.2–1.0)
BUN: 33 mg/dL — AB (ref 7–18)
CALCIUM: 8.8 mg/dL (ref 8.5–10.1)
CHLORIDE: 105 mmol/L (ref 98–107)
CREATININE: 1.65 mg/dL — AB (ref 0.60–1.30)
Co2: 23 mmol/L (ref 21–32)
EGFR (African American): 49 — ABNORMAL LOW
EGFR (Non-African Amer.): 42 — ABNORMAL LOW
Glucose: 120 mg/dL — ABNORMAL HIGH (ref 65–99)
OSMOLALITY: 286 (ref 275–301)
Potassium: 4.2 mmol/L (ref 3.5–5.1)
SGOT(AST): 15 U/L (ref 15–37)
SGPT (ALT): 20 U/L
SODIUM: 139 mmol/L (ref 136–145)
Total Protein: 7.4 g/dL (ref 6.4–8.2)

## 2014-06-22 LAB — GAMMA GT: GGT: 21 U/L (ref 5–85)

## 2014-06-22 LAB — BILIRUBIN, DIRECT

## 2014-06-22 LAB — LIPID PANEL
Cholesterol: 96 mg/dL (ref 0–200)
HDL Cholesterol: 55 mg/dL (ref 40–60)
LDL CHOLESTEROL, CALC: 27 mg/dL (ref 0–100)
Triglycerides: 71 mg/dL (ref 0–200)
VLDL Cholesterol, Calc: 14 mg/dL (ref 5–40)

## 2014-06-29 ENCOUNTER — Other Ambulatory Visit: Payer: Self-pay | Admitting: Nephrology

## 2014-07-26 LAB — BASIC METABOLIC PANEL
Anion Gap: 9 (ref 7–16)
BUN: 29 mg/dL — AB (ref 7–18)
Calcium, Total: 9 mg/dL (ref 8.5–10.1)
Chloride: 104 mmol/L (ref 98–107)
Co2: 27 mmol/L (ref 21–32)
Creatinine: 1.65 mg/dL — ABNORMAL HIGH (ref 0.60–1.30)
EGFR (Non-African Amer.): 44 — ABNORMAL LOW
GFR CALC AF AMER: 54 — AB
GLUCOSE: 113 mg/dL — AB (ref 65–99)
Osmolality: 286 (ref 275–301)
POTASSIUM: 4.3 mmol/L (ref 3.5–5.1)
Sodium: 140 mmol/L (ref 136–145)

## 2014-07-26 LAB — PHOSPHORUS: PHOSPHORUS: 2.8 mg/dL (ref 2.5–4.9)

## 2014-07-26 LAB — MAGNESIUM: MAGNESIUM: 2 mg/dL

## 2014-07-29 ENCOUNTER — Encounter: Payer: Self-pay | Admitting: Nephrology

## 2014-09-14 ENCOUNTER — Encounter: Payer: Self-pay | Admitting: Nephrology

## 2014-09-14 LAB — CBC WITH DIFFERENTIAL/PLATELET
BASOS PCT: 0.5 %
Basophil #: 0 10*3/uL (ref 0.0–0.1)
Eosinophil #: 0 10*3/uL (ref 0.0–0.7)
Eosinophil %: 0.5 %
HCT: 43 % (ref 40.0–52.0)
HGB: 13.5 g/dL (ref 13.0–18.0)
LYMPHS ABS: 1.1 10*3/uL (ref 1.0–3.6)
LYMPHS PCT: 16.2 %
MCH: 26.7 pg (ref 26.0–34.0)
MCHC: 31.4 g/dL — AB (ref 32.0–36.0)
MCV: 85 fL (ref 80–100)
Monocyte #: 0.9 x10 3/mm (ref 0.2–1.0)
Monocyte %: 12.3 %
Neutrophil #: 4.9 10*3/uL (ref 1.4–6.5)
Neutrophil %: 70.5 %
Platelet: 147 10*3/uL — ABNORMAL LOW (ref 150–440)
RBC: 5.05 10*6/uL (ref 4.40–5.90)
RDW: 15 % — AB (ref 11.5–14.5)
WBC: 7 10*3/uL (ref 3.8–10.6)

## 2014-09-14 LAB — LIPID PANEL
CHOLESTEROL: 131 mg/dL (ref 0–200)
HDL Cholesterol: 79 mg/dL — ABNORMAL HIGH (ref 40–60)
Ldl Cholesterol, Calc: 32 mg/dL (ref 0–100)
TRIGLYCERIDES: 101 mg/dL (ref 0–200)
VLDL CHOLESTEROL, CALC: 20 mg/dL (ref 5–40)

## 2014-09-14 LAB — COMPREHENSIVE METABOLIC PANEL
ANION GAP: 8 (ref 7–16)
Albumin: 3.8 g/dL (ref 3.4–5.0)
Alkaline Phosphatase: 56 U/L
BILIRUBIN TOTAL: 0.4 mg/dL (ref 0.2–1.0)
BUN: 44 mg/dL — ABNORMAL HIGH (ref 7–18)
CALCIUM: 8.8 mg/dL (ref 8.5–10.1)
CO2: 25 mmol/L (ref 21–32)
Chloride: 105 mmol/L (ref 98–107)
Creatinine: 1.7 mg/dL — ABNORMAL HIGH (ref 0.60–1.30)
GFR CALC AF AMER: 52 — AB
GFR CALC NON AF AMER: 43 — AB
GLUCOSE: 146 mg/dL — AB (ref 65–99)
Osmolality: 290 (ref 275–301)
Potassium: 4.5 mmol/L (ref 3.5–5.1)
SGOT(AST): 24 U/L (ref 15–37)
SGPT (ALT): 23 U/L
Sodium: 138 mmol/L (ref 136–145)
Total Protein: 7.3 g/dL (ref 6.4–8.2)

## 2014-09-14 LAB — BILIRUBIN, DIRECT: Bilirubin, Direct: <0.1 mg/dL (ref 0.0–0.2)

## 2014-09-14 LAB — GAMMA GT: GGT: 21 U/L (ref 5–85)

## 2014-09-28 ENCOUNTER — Encounter: Payer: Self-pay | Admitting: Nephrology

## 2014-10-11 LAB — CBC WITH DIFFERENTIAL/PLATELET
BASOS ABS: 0 10*3/uL (ref 0.0–0.1)
Basophil %: 0.5 %
Eosinophil #: 0 10*3/uL (ref 0.0–0.7)
Eosinophil %: 0.4 %
HCT: 42.3 % (ref 40.0–52.0)
HGB: 13.4 g/dL (ref 13.0–18.0)
LYMPHS ABS: 0.9 10*3/uL — AB (ref 1.0–3.6)
Lymphocyte %: 11.7 %
MCH: 26.8 pg (ref 26.0–34.0)
MCHC: 31.7 g/dL — ABNORMAL LOW (ref 32.0–36.0)
MCV: 85 fL (ref 80–100)
MONO ABS: 1.1 x10 3/mm — AB (ref 0.2–1.0)
MONOS PCT: 13.6 %
NEUTROS ABS: 5.9 10*3/uL (ref 1.4–6.5)
NEUTROS PCT: 73.8 %
PLATELETS: 160 10*3/uL (ref 150–440)
RBC: 4.99 10*6/uL (ref 4.40–5.90)
RDW: 15.3 % — AB (ref 11.5–14.5)
WBC: 7.9 10*3/uL (ref 3.8–10.6)

## 2014-10-11 LAB — MAGNESIUM: MAGNESIUM: 2.4 mg/dL

## 2014-10-11 LAB — BASIC METABOLIC PANEL
Anion Gap: 8 (ref 7–16)
BUN: 38 mg/dL — ABNORMAL HIGH (ref 7–18)
Calcium, Total: 9.6 mg/dL (ref 8.5–10.1)
Chloride: 103 mmol/L (ref 98–107)
Co2: 25 mmol/L (ref 21–32)
Creatinine: 1.84 mg/dL — ABNORMAL HIGH (ref 0.60–1.30)
EGFR (African American): 47 — ABNORMAL LOW
EGFR (Non-African Amer.): 39 — ABNORMAL LOW
Glucose: 118 mg/dL — ABNORMAL HIGH (ref 65–99)
Osmolality: 282 (ref 275–301)
Potassium: 4.7 mmol/L (ref 3.5–5.1)
Sodium: 136 mmol/L (ref 136–145)

## 2014-10-11 LAB — PHOSPHORUS: Phosphorus: 3 mg/dL (ref 2.5–4.9)

## 2014-10-29 ENCOUNTER — Encounter: Payer: Self-pay | Admitting: Nephrology

## 2014-12-23 ENCOUNTER — Other Ambulatory Visit: Payer: Self-pay | Admitting: Nephrology

## 2014-12-28 ENCOUNTER — Other Ambulatory Visit
Admit: 2014-12-28 | Disposition: A | Discharge status: Home / Self Care | Payer: Self-pay | Attending: Nephrology | Admitting: Nephrology

## 2015-01-21 LAB — CBC WITH DIFFERENTIAL/PLATELET
Basophil #: 0 10*3/uL (ref 0.0–0.1)
Basophil %: 0.4 %
EOS PCT: 0.3 %
Eosinophil #: 0 10*3/uL (ref 0.0–0.7)
HCT: 41.3 % (ref 40.0–52.0)
HGB: 13.3 g/dL (ref 13.0–18.0)
LYMPHS ABS: 1.2 10*3/uL (ref 1.0–3.6)
Lymphocyte %: 19.8 %
MCH: 27 pg (ref 26.0–34.0)
MCHC: 32.1 g/dL (ref 32.0–36.0)
MCV: 84 fL (ref 80–100)
MONOS PCT: 12.8 %
Monocyte #: 0.8 x10 3/mm (ref 0.2–1.0)
NEUTROS ABS: 4 10*3/uL (ref 1.4–6.5)
Neutrophil %: 66.7 %
Platelet: 154 10*3/uL (ref 150–440)
RBC: 4.93 10*6/uL (ref 4.40–5.90)
RDW: 13.8 % (ref 11.5–14.5)
WBC: 6 10*3/uL (ref 3.8–10.6)

## 2015-01-21 LAB — BASIC METABOLIC PANEL
Anion Gap: 6 — ABNORMAL LOW (ref 7–16)
BUN: 38 mg/dL — ABNORMAL HIGH
CALCIUM: 9.7 mg/dL
Chloride: 106 mmol/L
Co2: 25 mmol/L
Creatinine: 1.35 mg/dL — ABNORMAL HIGH
EGFR (African American): >60
EGFR (Non-African Amer.): 53 — ABNORMAL LOW
Glucose: 129 mg/dL — ABNORMAL HIGH
Potassium: 4.9 mmol/L
SODIUM: 137 mmol/L

## 2015-01-21 LAB — MAGNESIUM: MAGNESIUM: 2.1 mg/dL

## 2015-01-21 LAB — PHOSPHORUS: Phosphorus: 2.7 mg/dL

## 2015-01-28 ENCOUNTER — Other Ambulatory Visit
Admit: 2015-01-28 | Disposition: A | Discharge status: Home / Self Care | Payer: Self-pay | Attending: Nephrology | Admitting: Nephrology

## 2015-03-24 ENCOUNTER — Encounter
Admission: RE | Admit: 2015-03-24 | Discharge: 2015-03-24 | Disposition: A | Discharge status: Home / Self Care | Payer: Medicare Other | Source: Ambulatory Visit | Attending: Nephrology | Admitting: Nephrology

## 2015-03-24 DIAGNOSIS — Z940 Kidney transplant status: Secondary | ICD-10-CM | POA: Diagnosis present

## 2015-03-24 DIAGNOSIS — D631 Anemia in chronic kidney disease: Secondary | ICD-10-CM | POA: Insufficient documentation

## 2015-03-24 DIAGNOSIS — N390 Urinary tract infection, site not specified: Secondary | ICD-10-CM | POA: Insufficient documentation

## 2015-03-24 DIAGNOSIS — Z789 Other specified health status: Secondary | ICD-10-CM | POA: Insufficient documentation

## 2015-03-24 DIAGNOSIS — E1159 Type 2 diabetes mellitus with other circulatory complications: Secondary | ICD-10-CM | POA: Diagnosis not present

## 2015-03-24 DIAGNOSIS — E559 Vitamin D deficiency, unspecified: Secondary | ICD-10-CM | POA: Diagnosis present

## 2015-03-24 DIAGNOSIS — Z79899 Other long term (current) drug therapy: Secondary | ICD-10-CM | POA: Insufficient documentation

## 2015-03-24 DIAGNOSIS — E1129 Type 2 diabetes mellitus with other diabetic kidney complication: Secondary | ICD-10-CM | POA: Insufficient documentation

## 2015-03-24 LAB — COMPREHENSIVE METABOLIC PANEL
ALT: 11 U/L — ABNORMAL LOW (ref 17–63)
AST: 17 U/L (ref 15–41)
Albumin: 4.2 g/dL (ref 3.5–5.0)
Alkaline Phosphatase: 53 U/L (ref 38–126)
Anion gap: 5 (ref 5–15)
BUN: 31 mg/dL — ABNORMAL HIGH (ref 6–20)
CO2: 26 mmol/L (ref 22–32)
Calcium: 9.6 mg/dL (ref 8.9–10.3)
Chloride: 105 mmol/L (ref 101–111)
Creatinine, Ser: 1.31 mg/dL — ABNORMAL HIGH (ref 0.61–1.24)
GFR calc Af Amer: >60 mL/min (ref >60)
GFR, EST NON AFRICAN AMERICAN: 54 mL/min — AB (ref >60)
Glucose, Bld: 118 mg/dL — ABNORMAL HIGH (ref 65–99)
POTASSIUM: 5 mmol/L (ref 3.5–5.1)
SODIUM: 136 mmol/L (ref 135–145)
Total Bilirubin: 0.6 mg/dL (ref 0.3–1.2)
Total Protein: 7.3 g/dL (ref 6.5–8.1)

## 2015-03-24 LAB — CBC WITH DIFFERENTIAL/PLATELET
BASOS ABS: 0 10*3/uL (ref 0–0.1)
BASOS PCT: 0 %
EOS PCT: 0 %
Eosinophils Absolute: 0 10*3/uL (ref 0–0.7)
HCT: 41.1 % (ref 40.0–52.0)
HEMOGLOBIN: 13 g/dL (ref 13.0–18.0)
Lymphocytes Relative: 15 %
Lymphs Abs: 0.9 10*3/uL — ABNORMAL LOW (ref 1.0–3.6)
MCH: 26.3 pg (ref 26.0–34.0)
MCHC: 31.7 g/dL — AB (ref 32.0–36.0)
MCV: 83 fL (ref 80.0–100.0)
Monocytes Absolute: 0.5 10*3/uL (ref 0.2–1.0)
Monocytes Relative: 9 %
NEUTROS PCT: 76 %
Neutro Abs: 4.2 10*3/uL (ref 1.4–6.5)
Platelets: 155 10*3/uL (ref 150–440)
RBC: 4.95 MIL/uL (ref 4.40–5.90)
RDW: 14.1 % (ref 11.5–14.5)
WBC: 5.6 10*3/uL (ref 3.8–10.6)

## 2015-03-24 LAB — LIPID PANEL
Cholesterol: 121 mg/dL (ref 0–200)
HDL: 55 mg/dL (ref >40)
LDL Cholesterol: 51 mg/dL (ref 0–99)
TRIGLYCERIDES: 74 mg/dL (ref <150)
Total CHOL/HDL Ratio: 2.2 RATIO
VLDL: 15 mg/dL (ref 0–40)

## 2015-03-24 LAB — PHOSPHORUS: PHOSPHORUS: 2.7 mg/dL (ref 2.5–4.6)

## 2015-03-24 LAB — GAMMA GT: GGT: 25 U/L (ref 7–50)

## 2015-03-24 LAB — MAGNESIUM: Magnesium: 2 mg/dL (ref 1.7–2.4)

## 2015-04-27 ENCOUNTER — Encounter
Admission: RE | Admit: 2015-04-27 | Discharge: 2015-04-27 | Disposition: A | Discharge status: Home / Self Care | Payer: Medicare Other | Source: Ambulatory Visit | Attending: Nephrology | Admitting: Nephrology

## 2015-04-27 DIAGNOSIS — E1159 Type 2 diabetes mellitus with other circulatory complications: Secondary | ICD-10-CM | POA: Insufficient documentation

## 2015-04-27 DIAGNOSIS — Z940 Kidney transplant status: Secondary | ICD-10-CM | POA: Diagnosis present

## 2015-04-27 DIAGNOSIS — E559 Vitamin D deficiency, unspecified: Secondary | ICD-10-CM | POA: Diagnosis present

## 2015-04-27 DIAGNOSIS — E1129 Type 2 diabetes mellitus with other diabetic kidney complication: Secondary | ICD-10-CM | POA: Insufficient documentation

## 2015-04-27 DIAGNOSIS — Z79899 Other long term (current) drug therapy: Secondary | ICD-10-CM | POA: Diagnosis present

## 2015-04-27 DIAGNOSIS — D631 Anemia in chronic kidney disease: Secondary | ICD-10-CM | POA: Insufficient documentation

## 2015-04-27 DIAGNOSIS — Z789 Other specified health status: Secondary | ICD-10-CM | POA: Diagnosis not present

## 2015-04-27 DIAGNOSIS — N390 Urinary tract infection, site not specified: Secondary | ICD-10-CM | POA: Insufficient documentation

## 2015-04-27 LAB — PHOSPHORUS: PHOSPHORUS: 3.4 mg/dL (ref 2.5–4.6)

## 2015-04-27 LAB — BASIC METABOLIC PANEL
Anion gap: 9 (ref 5–15)
BUN: 35 mg/dL — ABNORMAL HIGH (ref 6–20)
CALCIUM: 9.9 mg/dL (ref 8.9–10.3)
CO2: 25 mmol/L (ref 22–32)
CREATININE: 1.36 mg/dL — AB (ref 0.61–1.24)
Chloride: 103 mmol/L (ref 101–111)
GFR, EST AFRICAN AMERICAN: 60 mL/min — AB (ref >60)
GFR, EST NON AFRICAN AMERICAN: 52 mL/min — AB (ref >60)
Glucose, Bld: 118 mg/dL — ABNORMAL HIGH (ref 65–99)
Potassium: 4.5 mmol/L (ref 3.5–5.1)
Sodium: 137 mmol/L (ref 135–145)

## 2015-04-27 LAB — CBC WITH DIFFERENTIAL/PLATELET
Basophils Absolute: 0 10*3/uL (ref 0–0.1)
Basophils Relative: 1 %
Eosinophils Absolute: 0 10*3/uL (ref 0–0.7)
Eosinophils Relative: 1 %
HEMATOCRIT: 42.5 % (ref 40.0–52.0)
HEMOGLOBIN: 13.4 g/dL (ref 13.0–18.0)
Lymphocytes Relative: 18 %
Lymphs Abs: 0.9 10*3/uL — ABNORMAL LOW (ref 1.0–3.6)
MCH: 26.3 pg (ref 26.0–34.0)
MCHC: 31.5 g/dL — ABNORMAL LOW (ref 32.0–36.0)
MCV: 83.5 fL (ref 80.0–100.0)
MONO ABS: 0.6 10*3/uL (ref 0.2–1.0)
MONOS PCT: 13 %
NEUTROS ABS: 3.4 10*3/uL (ref 1.4–6.5)
Neutrophils Relative %: 67 %
Platelets: 165 10*3/uL (ref 150–440)
RBC: 5.09 MIL/uL (ref 4.40–5.90)
RDW: 14.6 % — ABNORMAL HIGH (ref 11.5–14.5)
WBC: 5 10*3/uL (ref 3.8–10.6)

## 2015-04-27 LAB — MAGNESIUM: MAGNESIUM: 2 mg/dL (ref 1.7–2.4)

## 2015-05-23 ENCOUNTER — Other Ambulatory Visit
Admission: RE | Admit: 2015-05-23 | Discharge: 2015-05-23 | Disposition: A | Discharge status: Home / Self Care | Payer: Medicare Other | Source: Ambulatory Visit | Attending: Nephrology | Admitting: Nephrology

## 2015-05-23 DIAGNOSIS — Z940 Kidney transplant status: Secondary | ICD-10-CM | POA: Diagnosis present

## 2015-05-23 DIAGNOSIS — Z789 Other specified health status: Secondary | ICD-10-CM | POA: Diagnosis not present

## 2015-05-23 DIAGNOSIS — B259 Cytomegaloviral disease, unspecified: Secondary | ICD-10-CM | POA: Insufficient documentation

## 2015-05-23 DIAGNOSIS — Z79899 Other long term (current) drug therapy: Secondary | ICD-10-CM | POA: Diagnosis present

## 2015-05-23 DIAGNOSIS — N390 Urinary tract infection, site not specified: Secondary | ICD-10-CM | POA: Insufficient documentation

## 2015-05-23 DIAGNOSIS — Z114 Encounter for screening for human immunodeficiency virus [HIV]: Secondary | ICD-10-CM | POA: Insufficient documentation

## 2015-05-23 DIAGNOSIS — E1129 Type 2 diabetes mellitus with other diabetic kidney complication: Secondary | ICD-10-CM | POA: Insufficient documentation

## 2015-05-23 DIAGNOSIS — E559 Vitamin D deficiency, unspecified: Secondary | ICD-10-CM | POA: Insufficient documentation

## 2015-05-23 DIAGNOSIS — T8610 Unspecified complication of kidney transplant: Secondary | ICD-10-CM | POA: Insufficient documentation

## 2015-05-23 DIAGNOSIS — Z9483 Pancreas transplant status: Secondary | ICD-10-CM | POA: Diagnosis not present

## 2015-05-23 DIAGNOSIS — Z09 Encounter for follow-up examination after completed treatment for conditions other than malignant neoplasm: Secondary | ICD-10-CM | POA: Insufficient documentation

## 2015-05-23 DIAGNOSIS — D631 Anemia in chronic kidney disease: Secondary | ICD-10-CM | POA: Diagnosis not present

## 2015-05-23 LAB — CBC WITH DIFFERENTIAL/PLATELET
BASOS PCT: 1 %
Basophils Absolute: 0 10*3/uL (ref 0–0.1)
Eosinophils Absolute: 0 10*3/uL (ref 0–0.7)
Eosinophils Relative: 1 %
HCT: 42.9 % (ref 40.0–52.0)
HEMOGLOBIN: 13.7 g/dL (ref 13.0–18.0)
Lymphocytes Relative: 19 %
Lymphs Abs: 0.9 10*3/uL — ABNORMAL LOW (ref 1.0–3.6)
MCH: 26.1 pg (ref 26.0–34.0)
MCHC: 31.8 g/dL — AB (ref 32.0–36.0)
MCV: 82.1 fL (ref 80.0–100.0)
MONO ABS: 0.6 10*3/uL (ref 0.2–1.0)
MONOS PCT: 12 %
NEUTROS ABS: 3.3 10*3/uL (ref 1.4–6.5)
NEUTROS PCT: 67 %
Platelets: 181 10*3/uL (ref 150–440)
RBC: 5.22 MIL/uL (ref 4.40–5.90)
RDW: 14.6 % — ABNORMAL HIGH (ref 11.5–14.5)
WBC: 4.9 10*3/uL (ref 3.8–10.6)

## 2015-05-23 LAB — BASIC METABOLIC PANEL
Anion gap: 8 (ref 5–15)
BUN: 26 mg/dL — ABNORMAL HIGH (ref 6–20)
CHLORIDE: 105 mmol/L (ref 101–111)
CO2: 24 mmol/L (ref 22–32)
CREATININE: 1.27 mg/dL — AB (ref 0.61–1.24)
Calcium: 9.8 mg/dL (ref 8.9–10.3)
GFR calc non Af Amer: 56 mL/min — ABNORMAL LOW (ref >60)
Glucose, Bld: 111 mg/dL — ABNORMAL HIGH (ref 65–99)
POTASSIUM: 5.2 mmol/L — AB (ref 3.5–5.1)
Sodium: 137 mmol/L (ref 135–145)

## 2015-05-23 LAB — MAGNESIUM: Magnesium: 1.8 mg/dL (ref 1.7–2.4)

## 2015-05-23 LAB — PHOSPHORUS: Phosphorus: 3.1 mg/dL (ref 2.5–4.6)

## 2015-06-16 LAB — MISCELLANEOUS TEST

## 2015-06-22 ENCOUNTER — Other Ambulatory Visit
Admission: RE | Admit: 2015-06-22 | Discharge: 2015-06-22 | Disposition: A | Discharge status: Home / Self Care | Payer: Medicare Other | Source: Ambulatory Visit | Attending: Nephrology | Admitting: Nephrology

## 2015-06-22 DIAGNOSIS — Z940 Kidney transplant status: Secondary | ICD-10-CM | POA: Diagnosis present

## 2015-06-22 LAB — COMPREHENSIVE METABOLIC PANEL
ALT: 12 U/L — ABNORMAL LOW (ref 17–63)
ANION GAP: 6 (ref 5–15)
AST: 19 U/L (ref 15–41)
Albumin: 4.8 g/dL (ref 3.5–5.0)
Alkaline Phosphatase: 51 U/L (ref 38–126)
BILIRUBIN TOTAL: 0.7 mg/dL (ref 0.3–1.2)
BUN: 39 mg/dL — ABNORMAL HIGH (ref 6–20)
CO2: 27 mmol/L (ref 22–32)
Calcium: 9.8 mg/dL (ref 8.9–10.3)
Chloride: 104 mmol/L (ref 101–111)
Creatinine, Ser: 1.46 mg/dL — ABNORMAL HIGH (ref 0.61–1.24)
GFR calc non Af Amer: 47 mL/min — ABNORMAL LOW (ref >60)
GFR, EST AFRICAN AMERICAN: 55 mL/min — AB (ref >60)
Glucose, Bld: 93 mg/dL (ref 65–99)
POTASSIUM: 5.1 mmol/L (ref 3.5–5.1)
Sodium: 137 mmol/L (ref 135–145)
Total Protein: 7.8 g/dL (ref 6.5–8.1)

## 2015-06-22 LAB — CBC WITH DIFFERENTIAL/PLATELET
BASOS PCT: 1 %
Basophils Absolute: 0 10*3/uL (ref 0–0.1)
EOS PCT: 1 %
Eosinophils Absolute: 0 10*3/uL (ref 0–0.7)
HCT: 42.6 % (ref 40.0–52.0)
HEMOGLOBIN: 13.4 g/dL (ref 13.0–18.0)
Lymphocytes Relative: 21 %
Lymphs Abs: 1 10*3/uL (ref 1.0–3.6)
MCH: 26 pg (ref 26.0–34.0)
MCHC: 31.4 g/dL — ABNORMAL LOW (ref 32.0–36.0)
MCV: 82.9 fL (ref 80.0–100.0)
MONOS PCT: 15 %
Monocytes Absolute: 0.7 10*3/uL (ref 0.2–1.0)
NEUTROS PCT: 62 %
Neutro Abs: 3 10*3/uL (ref 1.4–6.5)
Platelets: 172 10*3/uL (ref 150–440)
RBC: 5.14 MIL/uL (ref 4.40–5.90)
RDW: 14.7 % — AB (ref 11.5–14.5)
WBC: 4.8 10*3/uL (ref 3.8–10.6)

## 2015-06-22 LAB — LIPID PANEL
Cholesterol: 124 mg/dL (ref 0–200)
HDL: 62 mg/dL (ref >40)
LDL CALC: 46 mg/dL (ref 0–99)
Total CHOL/HDL Ratio: 2 RATIO
Triglycerides: 81 mg/dL (ref <150)
VLDL: 16 mg/dL (ref 0–40)

## 2015-06-22 LAB — MAGNESIUM: Magnesium: 2.3 mg/dL (ref 1.7–2.4)

## 2015-06-22 LAB — GAMMA GT: GGT: 24 U/L (ref 7–50)

## 2015-06-22 LAB — PHOSPHORUS: Phosphorus: 3 mg/dL (ref 2.5–4.6)

## 2015-06-24 LAB — TACROLIMUS LEVEL: TACROLIMUS (FK506) - LABCORP: 5 ng/mL (ref 2.0–20.0)

## 2015-08-24 ENCOUNTER — Other Ambulatory Visit
Admission: RE | Admit: 2015-08-24 | Discharge: 2015-08-24 | Disposition: A | Discharge status: Home / Self Care | Payer: Medicare Other | Source: Ambulatory Visit | Attending: Nephrology | Admitting: Nephrology

## 2015-08-24 DIAGNOSIS — Z940 Kidney transplant status: Secondary | ICD-10-CM | POA: Insufficient documentation

## 2015-08-24 DIAGNOSIS — E559 Vitamin D deficiency, unspecified: Secondary | ICD-10-CM | POA: Insufficient documentation

## 2015-08-24 DIAGNOSIS — D631 Anemia in chronic kidney disease: Secondary | ICD-10-CM | POA: Diagnosis not present

## 2015-08-24 DIAGNOSIS — Z789 Other specified health status: Secondary | ICD-10-CM | POA: Diagnosis not present

## 2015-08-24 DIAGNOSIS — E1129 Type 2 diabetes mellitus with other diabetic kidney complication: Secondary | ICD-10-CM | POA: Diagnosis not present

## 2015-08-24 DIAGNOSIS — N390 Urinary tract infection, site not specified: Secondary | ICD-10-CM | POA: Insufficient documentation

## 2015-08-24 DIAGNOSIS — Z79899 Other long term (current) drug therapy: Secondary | ICD-10-CM | POA: Insufficient documentation

## 2015-08-24 DIAGNOSIS — Z1159 Encounter for screening for other viral diseases: Secondary | ICD-10-CM | POA: Diagnosis not present

## 2015-08-24 LAB — CBC WITH DIFFERENTIAL/PLATELET
BASOS ABS: 0 10*3/uL (ref 0–0.1)
BASOS PCT: 1 %
Eosinophils Absolute: 0 10*3/uL (ref 0–0.7)
Eosinophils Relative: 1 %
HEMATOCRIT: 43.9 % (ref 40.0–52.0)
Hemoglobin: 13.7 g/dL (ref 13.0–18.0)
LYMPHS PCT: 16 %
Lymphs Abs: 1 10*3/uL (ref 1.0–3.6)
MCH: 26 pg (ref 26.0–34.0)
MCHC: 31.3 g/dL — ABNORMAL LOW (ref 32.0–36.0)
MCV: 83.3 fL (ref 80.0–100.0)
MONO ABS: 0.8 10*3/uL (ref 0.2–1.0)
Monocytes Relative: 13 %
Neutro Abs: 4.1 10*3/uL (ref 1.4–6.5)
Neutrophils Relative %: 69 %
PLATELETS: 168 10*3/uL (ref 150–440)
RBC: 5.28 MIL/uL (ref 4.40–5.90)
RDW: 14.1 % (ref 11.5–14.5)
WBC: 6 10*3/uL (ref 3.8–10.6)

## 2015-08-24 LAB — BASIC METABOLIC PANEL
ANION GAP: 7 (ref 5–15)
BUN: 35 mg/dL — ABNORMAL HIGH (ref 6–20)
CALCIUM: 9.7 mg/dL (ref 8.9–10.3)
CO2: 28 mmol/L (ref 22–32)
Chloride: 103 mmol/L (ref 101–111)
Creatinine, Ser: 1.36 mg/dL — ABNORMAL HIGH (ref 0.61–1.24)
GFR, EST AFRICAN AMERICAN: 59 mL/min — AB (ref >60)
GFR, EST NON AFRICAN AMERICAN: 51 mL/min — AB (ref >60)
GLUCOSE: 163 mg/dL — AB (ref 65–99)
POTASSIUM: 4.6 mmol/L (ref 3.5–5.1)
Sodium: 138 mmol/L (ref 135–145)

## 2015-08-24 LAB — MAGNESIUM: MAGNESIUM: 2 mg/dL (ref 1.7–2.4)

## 2015-08-24 LAB — PHOSPHORUS: Phosphorus: 3.5 mg/dL (ref 2.5–4.6)

## 2015-08-25 LAB — TACROLIMUS LEVEL: Tacrolimus (FK506) - LabCorp: 4.7 ng/mL (ref 2.0–20.0)

## 2015-09-26 ENCOUNTER — Encounter
Admission: RE | Admit: 2015-09-26 | Discharge: 2015-09-26 | Disposition: A | Discharge status: Home / Self Care | Payer: Medicare Other | Source: Ambulatory Visit | Attending: Nephrology | Admitting: Nephrology

## 2015-09-26 DIAGNOSIS — Z789 Other specified health status: Secondary | ICD-10-CM | POA: Diagnosis not present

## 2015-09-26 DIAGNOSIS — D631 Anemia in chronic kidney disease: Secondary | ICD-10-CM | POA: Insufficient documentation

## 2015-09-26 DIAGNOSIS — Z1159 Encounter for screening for other viral diseases: Secondary | ICD-10-CM | POA: Insufficient documentation

## 2015-09-26 DIAGNOSIS — N390 Urinary tract infection, site not specified: Secondary | ICD-10-CM | POA: Diagnosis not present

## 2015-09-26 DIAGNOSIS — Z79899 Other long term (current) drug therapy: Secondary | ICD-10-CM | POA: Diagnosis present

## 2015-09-26 DIAGNOSIS — Z940 Kidney transplant status: Secondary | ICD-10-CM | POA: Insufficient documentation

## 2015-09-26 DIAGNOSIS — E559 Vitamin D deficiency, unspecified: Secondary | ICD-10-CM | POA: Diagnosis present

## 2015-09-26 LAB — LIPID PANEL
CHOL/HDL RATIO: 1.9 ratio
Cholesterol: 124 mg/dL (ref 0–200)
HDL: 64 mg/dL (ref >40)
LDL CALC: 50 mg/dL (ref 0–99)
TRIGLYCERIDES: 50 mg/dL (ref <150)
VLDL: 10 mg/dL (ref 0–40)

## 2015-09-26 LAB — COMPREHENSIVE METABOLIC PANEL
ALBUMIN: 4.6 g/dL (ref 3.5–5.0)
ALT: 14 U/L — ABNORMAL LOW (ref 17–63)
ANION GAP: 7 (ref 5–15)
AST: 17 U/L (ref 15–41)
Alkaline Phosphatase: 53 U/L (ref 38–126)
BILIRUBIN TOTAL: 0.7 mg/dL (ref 0.3–1.2)
BUN: 46 mg/dL — ABNORMAL HIGH (ref 6–20)
CO2: 26 mmol/L (ref 22–32)
Calcium: 9.7 mg/dL (ref 8.9–10.3)
Chloride: 103 mmol/L (ref 101–111)
Creatinine, Ser: 1.41 mg/dL — ABNORMAL HIGH (ref 0.61–1.24)
GFR calc Af Amer: 57 mL/min — ABNORMAL LOW (ref >60)
GFR calc non Af Amer: 49 mL/min — ABNORMAL LOW (ref >60)
GLUCOSE: 151 mg/dL — AB (ref 65–99)
POTASSIUM: 4.4 mmol/L (ref 3.5–5.1)
Sodium: 136 mmol/L (ref 135–145)
Total Protein: 7.8 g/dL (ref 6.5–8.1)

## 2015-09-26 LAB — GAMMA GT: GGT: 25 U/L (ref 7–50)

## 2015-09-26 LAB — CBC WITH DIFFERENTIAL/PLATELET
BASOS ABS: 0 10*3/uL (ref 0–0.1)
Basophils Relative: 1 %
EOS PCT: 0 %
Eosinophils Absolute: 0 10*3/uL (ref 0–0.7)
HEMATOCRIT: 42.8 % (ref 40.0–52.0)
HEMOGLOBIN: 13.5 g/dL (ref 13.0–18.0)
LYMPHS ABS: 1.2 10*3/uL (ref 1.0–3.6)
LYMPHS PCT: 20 %
MCH: 26.2 pg (ref 26.0–34.0)
MCHC: 31.5 g/dL — ABNORMAL LOW (ref 32.0–36.0)
MCV: 83 fL (ref 80.0–100.0)
Monocytes Absolute: 0.8 10*3/uL (ref 0.2–1.0)
Monocytes Relative: 14 %
NEUTROS ABS: 3.8 10*3/uL (ref 1.4–6.5)
NEUTROS PCT: 65 %
Platelets: 177 10*3/uL (ref 150–440)
RBC: 5.15 MIL/uL (ref 4.40–5.90)
RDW: 14.2 % (ref 11.5–14.5)
WBC: 5.9 10*3/uL (ref 3.8–10.6)

## 2015-09-26 LAB — PHOSPHORUS: Phosphorus: 3.2 mg/dL (ref 2.5–4.6)

## 2015-09-26 LAB — MAGNESIUM: Magnesium: 2.1 mg/dL (ref 1.7–2.4)

## 2015-09-27 LAB — TACROLIMUS LEVEL: TACROLIMUS (FK506) - LABCORP: 4.4 ng/mL (ref 2.0–20.0)

## 2015-10-23 ENCOUNTER — Emergency Department
Admission: EM | Admit: 2015-10-23 | Discharge: 2015-10-23 | Disposition: A | Discharge status: Other Acute Inpatient Hospital | Payer: Medicare Other | Attending: Emergency Medicine | Admitting: Emergency Medicine

## 2015-10-23 ENCOUNTER — Encounter: Payer: Self-pay | Admitting: *Deleted

## 2015-10-23 ENCOUNTER — Emergency Department: Payer: Medicare Other

## 2015-10-23 ENCOUNTER — Ambulatory Visit (HOSPITAL_COMMUNITY)
Admission: AD | Admit: 2015-10-23 | Discharge: 2015-10-23 | Disposition: A | Discharge status: Home / Self Care | Payer: Medicare Other | Source: Other Acute Inpatient Hospital | Attending: Emergency Medicine | Admitting: Emergency Medicine

## 2015-10-23 DIAGNOSIS — E11649 Type 2 diabetes mellitus with hypoglycemia without coma: Secondary | ICD-10-CM | POA: Diagnosis not present

## 2015-10-23 DIAGNOSIS — Z91040 Latex allergy status: Secondary | ICD-10-CM | POA: Insufficient documentation

## 2015-10-23 DIAGNOSIS — Z905 Acquired absence of kidney: Secondary | ICD-10-CM | POA: Diagnosis not present

## 2015-10-23 DIAGNOSIS — Z8051 Family history of malignant neoplasm of kidney: Secondary | ICD-10-CM | POA: Insufficient documentation

## 2015-10-23 DIAGNOSIS — Z794 Long term (current) use of insulin: Secondary | ICD-10-CM | POA: Insufficient documentation

## 2015-10-23 DIAGNOSIS — E162 Hypoglycemia, unspecified: Secondary | ICD-10-CM

## 2015-10-23 DIAGNOSIS — A419 Sepsis, unspecified organism: Secondary | ICD-10-CM

## 2015-10-23 HISTORY — DX: Cerebral infarction, unspecified: I63.9

## 2015-10-23 HISTORY — DX: Type 2 diabetes mellitus without complications: E11.9

## 2015-10-23 LAB — URINALYSIS COMPLETE WITH MICROSCOPIC (ARMC ONLY)
BACTERIA UA: NONE SEEN
Bilirubin Urine: NEGATIVE
GLUCOSE, UA: NEGATIVE mg/dL
HGB URINE DIPSTICK: NEGATIVE
Ketones, ur: NEGATIVE mg/dL
LEUKOCYTES UA: NEGATIVE
Nitrite: NEGATIVE
PROTEIN: NEGATIVE mg/dL
SPECIFIC GRAVITY, URINE: 1.012 (ref 1.005–1.030)
WBC, UA: NONE SEEN WBC/hpf (ref 0–5)
pH: 5 (ref 5.0–8.0)

## 2015-10-23 LAB — BASIC METABOLIC PANEL
Anion gap: 9 (ref 5–15)
BUN: 33 mg/dL — AB (ref 6–20)
CALCIUM: 9.2 mg/dL (ref 8.9–10.3)
CO2: 21 mmol/L — ABNORMAL LOW (ref 22–32)
CREATININE: 1.17 mg/dL (ref 0.61–1.24)
Chloride: 111 mmol/L (ref 101–111)
GFR calc Af Amer: >60 mL/min (ref >60)
GLUCOSE: 130 mg/dL — AB (ref 65–99)
POTASSIUM: 3.9 mmol/L (ref 3.5–5.1)
SODIUM: 141 mmol/L (ref 135–145)

## 2015-10-23 LAB — GLUCOSE, CAPILLARY
Glucose-Capillary: 113 mg/dL — ABNORMAL HIGH (ref 65–99)
Glucose-Capillary: 145 mg/dL — ABNORMAL HIGH (ref 65–99)
Glucose-Capillary: 250 mg/dL — ABNORMAL HIGH (ref 65–99)

## 2015-10-23 LAB — CBC WITH DIFFERENTIAL/PLATELET
Basophils Absolute: 0 10*3/uL (ref 0–0.1)
Basophils Relative: 0 %
EOS ABS: 0 10*3/uL (ref 0–0.7)
EOS PCT: 0 %
HCT: 47 % (ref 40.0–52.0)
Hemoglobin: 15 g/dL (ref 13.0–18.0)
LYMPHS ABS: 1.5 10*3/uL (ref 1.0–3.6)
Lymphocytes Relative: 21 %
MCH: 26.5 pg (ref 26.0–34.0)
MCHC: 31.8 g/dL — ABNORMAL LOW (ref 32.0–36.0)
MCV: 83.4 fL (ref 80.0–100.0)
MONO ABS: 0.7 10*3/uL (ref 0.2–1.0)
Monocytes Relative: 9 %
Neutro Abs: 5.2 10*3/uL (ref 1.4–6.5)
Neutrophils Relative %: 70 %
PLATELETS: 149 10*3/uL — AB (ref 150–440)
RBC: 5.64 MIL/uL (ref 4.40–5.90)
RDW: 14.2 % (ref 11.5–14.5)
WBC: 7.4 10*3/uL (ref 3.8–10.6)

## 2015-10-23 LAB — LACTIC ACID, PLASMA: Lactic Acid, Venous: 0.9 mmol/L (ref 0.5–2.0)

## 2015-10-23 LAB — TROPONIN I

## 2015-10-23 MED ORDER — VANCOMYCIN HCL IN DEXTROSE 1-5 GM/200ML-% IV SOLN
1000.0000 mg | Freq: Once | INTRAVENOUS | Status: AC
  Administered 2015-10-23: 1000 mg via INTRAVENOUS
  Filled 2015-10-23: qty 200

## 2015-10-23 MED ORDER — PIPERACILLIN-TAZOBACTAM 3.375 G IVPB 30 MIN
3.3750 g | Freq: Once | INTRAVENOUS | Status: AC
  Administered 2015-10-23: 3.375 g via INTRAVENOUS
  Filled 2015-10-23: qty 50

## 2015-10-23 NOTE — ED Provider Notes (Signed)
[facility] Emergency Department Provider Note  ____________________________________________  Time seen: Seen upon arrival to the emergency department  I have reviewed the triage vital signs and the nursing notes.   HISTORY  Chief Complaint Hypoglycemia    HPI James Hicks is a 69 y.o. male with a history of diabetes and a kidney transplant in 2008 was presenting today with an episode of hypoglycemia. He said that several weeks ago he was transitioned from NovoLog to Humalog. She wanted but otherwise has not had any changes in his insulin dosing. He has not skipped any meals. He says he has had one other episode of hypoglycemia since he has been switched from NovoLog 2 Humalog.  He says he has not been feeling sick lately. He has not having any pain. He is compliant with his antirejection indications which include CellCept and Prograf. EMS was called to the house this morning because he felt like he was going to pass out. He tried to eat and drink but was unable to because of an altered level of consciousness. His glucose was found to be 51 but after D50 was raised to the 190s. Receiving glucose he quickly returned to his baseline. At this point he has no complaints.  Patient had a bilateral nephrectomy secondary to renal cancer. He had radiation but is no longer on any course of radiation. He says he has been urinating normally lately without any changes to his quantity or frequency of urination.  Says he took 14 units of Lantus last night and 10 units of Humalog which he takes on most nights.   Past Medical History  Diagnosis Date  . CVA (cerebral infarction)   . Diabetes mellitus without complication ([place])     There are no active problems to display for this patient.   Past Surgical History  Procedure Laterality Date  . Kidney transplant      No current outpatient prescriptions on file.  Allergies Latex and Other  History reviewed. No pertinent  family history.  Social History Social History  Substance Use Topics  . Smoking status: Never Smoker   . Smokeless tobacco: None  . Alcohol Use: None    Review of Systems Constitutional: No fever/chills Eyes: No visual changes. ENT: No sore throat. Cardiovascular: Denies chest pain. Respiratory: Denies shortness of breath. Gastrointestinal: No abdominal pain.  No nausea, no vomiting.  No diarrhea.  No constipation. Genitourinary: Negative for dysuria. Musculoskeletal: Negative for back pain. Skin: Negative for rash. Neurological: Negative for headaches, focal weakness or numbness.  10-point ROS otherwise negative.  ____________________________________________   PHYSICAL EXAM:  VITAL SIGNS: ED Triage Vitals  Enc Vitals Group     BP 10/23/15 0730 102/60 mmHg     Pulse Rate 10/23/15 0730 57     Resp 10/23/15 0730 18     Temp --      Temp src --      SpO2 10/23/15 0728 94 %     Weight 10/23/15 0730 270 lb (122.471 kg)     Height 10/23/15 0730 6\' 3"  (1.905 m)     Head Cir --      Peak Flow --      Pain Score --      Pain Loc --      Pain Edu? --      Excl. in [place]? --     Constitutional: Alert and oriented. Well appearing and in no acute distress. Eyes: Conjunctivae are normal. PERRL. EOMI. Head: Atraumatic. Nose:  No congestion/rhinnorhea. Mouth/Throat: Mucous membranes are moist.  Oropharynx non-erythematous. Neck: No stridor.   Cardiovascular: Normal rate, regular rhythm. Grossly normal heart sounds.  Good peripheral circulation. Respiratory: Normal respiratory effort.  No retractions. Lungs CTAB. Gastrointestinal: Soft and nontender. No distention. No abdominal bruits. No CVA tenderness. Musculoskeletal: No lower extremity tenderness nor edema.  No joint effusions. Left upper extremity dialysis access with palpable pulse. Neurologic:  Normal speech and language. No gross focal neurologic deficits are appreciated. No gait instability. Skin:  Skin is warm, dry and  intact. No rash noted. Psychiatric: Mood and affect are normal. Speech and behavior are normal.  ____________________________________________   LABS (all labs ordered are listed, but only abnormal results are displayed)  Labs Reviewed  CBC WITH DIFFERENTIAL/PLATELET - Abnormal; Notable for the following:    MCHC 31.8 (*)    Platelets 149 (*)    All other components within normal limits  BASIC METABOLIC PANEL - Abnormal; Notable for the following:    CO2 21 (*)    Glucose, Bld 130 (*)    BUN 33 (*)    All other components within normal limits  URINALYSIS COMPLETEWITH MICROSCOPIC ([facility] ONLY) - Abnormal; Notable for the following:    Color, Urine YELLOW (*)    APPearance CLEAR (*)    Squamous Epithelial / LPF 0-5 (*)    All other components within normal limits  GLUCOSE, CAPILLARY - Abnormal; Notable for the following:    Glucose-Capillary 113 (*)    All other components within normal limits  GLUCOSE, CAPILLARY - Abnormal; Notable for the following:    Glucose-Capillary 145 (*)    All other components within normal limits  GLUCOSE, CAPILLARY - Abnormal; Notable for the following:    Glucose-Capillary 250 (*)    All other components within normal limits  CULTURE, BLOOD (ROUTINE X 2)  CULTURE, BLOOD (ROUTINE X 2)  URINE CULTURE  TROPONIN I  LACTIC ACID, PLASMA  TACROLIMUS LEVEL  CBG MONITORING, ED  CBG MONITORING, ED   ____________________________________________  EKG  ED ECG REPORT I, Doran Stabler, the attending physician, personally viewed and interpreted this ECG.   Date: 10/23/2015  EKG Time: 733  Rate: 55  Rhythm: normal sinus rhythm  Axis: Normal axis  Intervals:nonspecific intraventricular conduction delay  ST&T Change: ST elevation with concave morphology and 23 and aVF as well as V2 and V3 V4 V5 and V6. T-wave inversion in aVL.  No significant change from 10/18/2013.  ____________________________________________  RADIOLOGY  Chest x-ray without  active disease ____________________________________________   PROCEDURES   ____________________________________________   INITIAL IMPRESSION / ASSESSMENT AND PLAN / ED COURSE  Pertinent labs & imaging results that were available during my care of the patient were reviewed by me and considered in my medical decision making (see chart for details).  ----------------------------------------- 9:12 AM on 10/23/2015 -----------------------------------------  Patient found to be hypothermic to 89 rectal. Possible sepsis in this transplant patient on immunosuppressants. Unclear source at this time. Pending urine results as well as chest x-ray. Discussed the case with Dr. Rockwell Germany of the transplant service at [facility] who agrees with transfer to [place] for further workup and treatment. Further discussed the case with Dr.Crapo in the emergency department who accepts the patient for transfer. Sepsis protocol was initiated and the patient is being given empiric antibiotics as well as being put on a bear hugger for his hypothermia. Because the patient did not report any skipped meals or overdoses of his insulin and his renal function  is normal after assumed that his hypoglycemia and hypothermia could be from sepsis. The plan was explained to the patient as well as his family who understand and are willing to comply. We are currently arranging transfer over to [place]. Patient remains at baseline mental status and he has not had any further episodes of hypoglycemia. ____________________________________________   FINAL CLINICAL IMPRESSION(S) / ED DIAGNOSES  Hyperglycemia. Sepsis.    Orbie Pyo, MD 10/23/15 [id number]

## 2015-10-23 NOTE — ED Notes (Signed)
Pt arrives via EMS from home with hypoglycemia, reports CBG of 51, EMS gave 1 amp of D50 and sugar came up to 191, pt recently had change in insulin dosage, pt hx of kidney transplant

## 2015-10-23 NOTE — ED Notes (Signed)
Renal pt, Sepsis fluid bolus withheld per EDP

## 2015-10-25 LAB — URINE CULTURE

## 2015-10-25 LAB — TACROLIMUS LEVEL: TACROLIMUS (FK506) - LABCORP: 8.5 ng/mL (ref 2.0–20.0)

## 2015-10-28 ENCOUNTER — Other Ambulatory Visit
Admission: RE | Admit: 2015-10-28 | Discharge: 2015-10-28 | Disposition: A | Discharge status: Home / Self Care | Payer: Medicare Other | Source: Ambulatory Visit | Attending: Nephrology | Admitting: Nephrology

## 2015-10-29 LAB — CULTURE, BLOOD (ROUTINE X 2)
CULTURE: NO GROWTH
Culture: NO GROWTH

## 2015-11-20 IMAGING — CR DG CHEST 1V PORT
1 series · 2 of 2 positions shown · non-contrast
Comparison: 04/09/2010

CLINICAL DATA: Tachycardia without chest pain

EXAM:
PORTABLE CHEST - 1 VIEW

[Series 1: ap · 0.17mm/px · 2 of 2 slices shown]
[im 1/2]
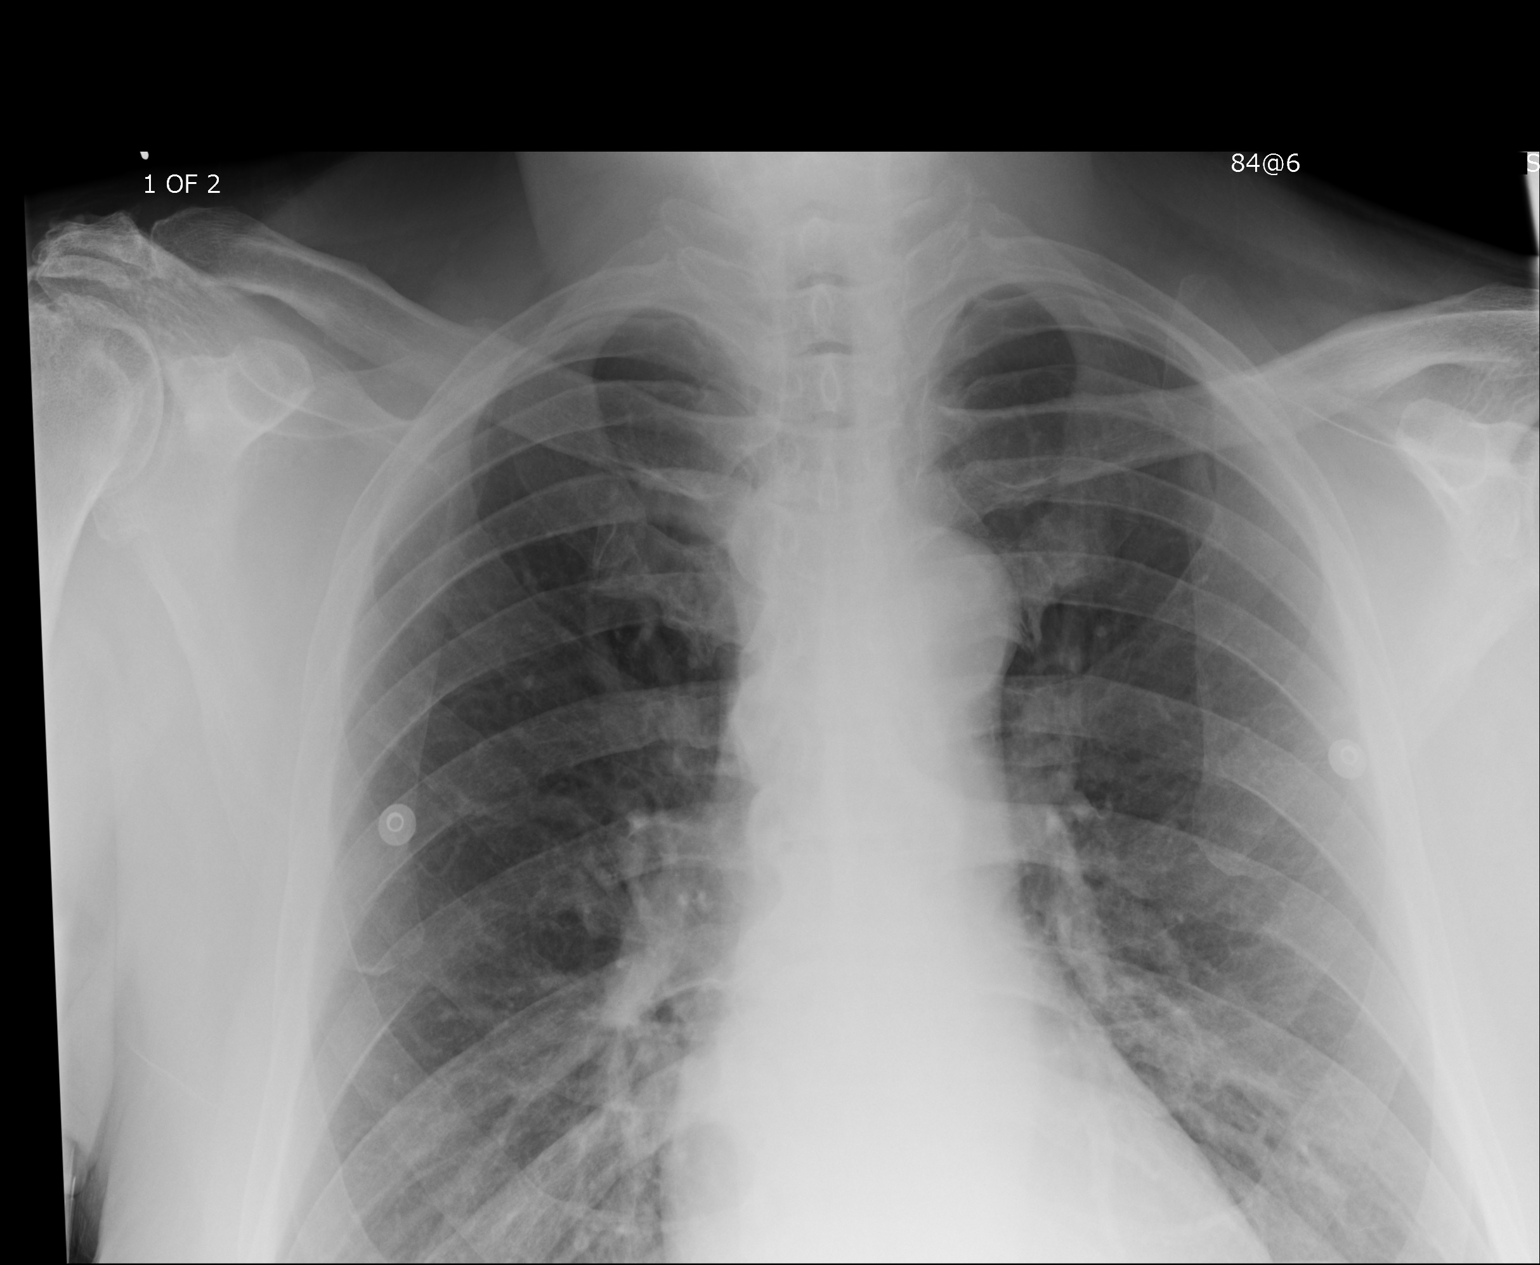
[im 2/2]
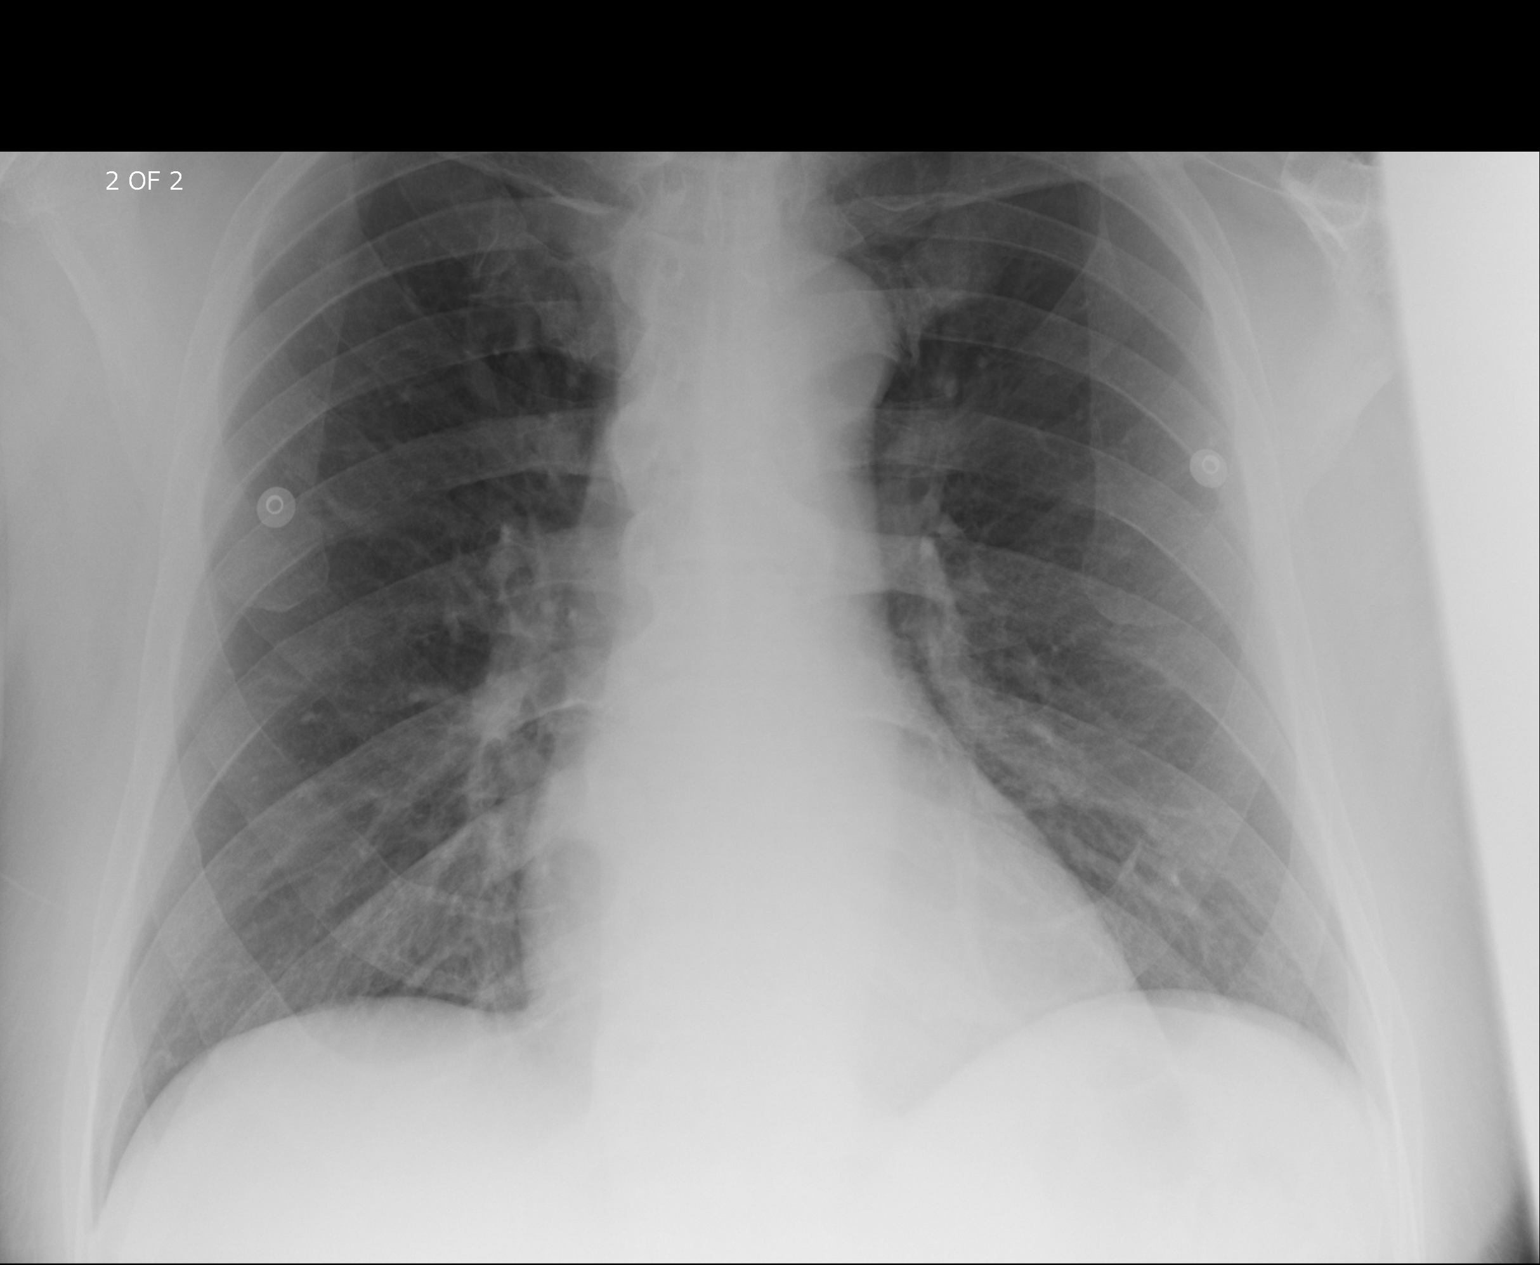

[2 of 2 positions shown; findings below may reference images not displayed]

FINDINGS: Mild bronchitic changes. No acute infiltrate, edema, effusion, or
pneumothorax. Normal heart size.
IMPRESSION: No active disease.

## 2015-12-13 ENCOUNTER — Encounter
Admission: RE | Admit: 2015-12-13 | Discharge: 2015-12-13 | Disposition: A | Discharge status: Home / Self Care | Payer: Medicare Other | Source: Ambulatory Visit | Attending: Nephrology | Admitting: Nephrology

## 2015-12-13 DIAGNOSIS — Z114 Encounter for screening for human immunodeficiency virus [HIV]: Secondary | ICD-10-CM | POA: Insufficient documentation

## 2015-12-13 DIAGNOSIS — N390 Urinary tract infection, site not specified: Secondary | ICD-10-CM | POA: Insufficient documentation

## 2015-12-13 DIAGNOSIS — Z789 Other specified health status: Secondary | ICD-10-CM | POA: Insufficient documentation

## 2015-12-13 DIAGNOSIS — Z09 Encounter for follow-up examination after completed treatment for conditions other than malignant neoplasm: Secondary | ICD-10-CM | POA: Insufficient documentation

## 2015-12-13 DIAGNOSIS — E559 Vitamin D deficiency, unspecified: Secondary | ICD-10-CM | POA: Insufficient documentation

## 2015-12-13 DIAGNOSIS — D899 Disorder involving the immune mechanism, unspecified: Secondary | ICD-10-CM | POA: Insufficient documentation

## 2015-12-13 DIAGNOSIS — Z79899 Other long term (current) drug therapy: Secondary | ICD-10-CM | POA: Diagnosis present

## 2015-12-13 DIAGNOSIS — D631 Anemia in chronic kidney disease: Secondary | ICD-10-CM | POA: Diagnosis not present

## 2015-12-13 DIAGNOSIS — E1129 Type 2 diabetes mellitus with other diabetic kidney complication: Secondary | ICD-10-CM | POA: Diagnosis not present

## 2015-12-13 DIAGNOSIS — Z940 Kidney transplant status: Secondary | ICD-10-CM | POA: Insufficient documentation

## 2015-12-13 LAB — CBC WITH DIFFERENTIAL/PLATELET
BASOS ABS: 0 10*3/uL (ref 0–0.1)
BASOS PCT: 1 %
EOS ABS: 0 10*3/uL (ref 0–0.7)
EOS PCT: 1 %
HCT: 44.8 % (ref 40.0–52.0)
HEMOGLOBIN: 14.3 g/dL (ref 13.0–18.0)
LYMPHS ABS: 1.1 10*3/uL (ref 1.0–3.6)
Lymphocytes Relative: 22 %
MCH: 26.3 pg (ref 26.0–34.0)
MCHC: 31.9 g/dL — ABNORMAL LOW (ref 32.0–36.0)
MCV: 82.2 fL (ref 80.0–100.0)
Monocytes Absolute: 0.8 10*3/uL (ref 0.2–1.0)
Monocytes Relative: 15 %
NEUTROS PCT: 61 %
Neutro Abs: 3.2 10*3/uL (ref 1.4–6.5)
PLATELETS: 159 10*3/uL (ref 150–440)
RBC: 5.46 MIL/uL (ref 4.40–5.90)
RDW: 14.1 % (ref 11.5–14.5)
WBC: 5.1 10*3/uL (ref 3.8–10.6)

## 2015-12-13 LAB — BASIC METABOLIC PANEL
Anion gap: 6 (ref 5–15)
BUN: 28 mg/dL — AB (ref 6–20)
CALCIUM: 9.4 mg/dL (ref 8.9–10.3)
CHLORIDE: 100 mmol/L — AB (ref 101–111)
CO2: 26 mmol/L (ref 22–32)
CREATININE: 1.43 mg/dL — AB (ref 0.61–1.24)
GFR, EST AFRICAN AMERICAN: 56 mL/min — AB (ref >60)
GFR, EST NON AFRICAN AMERICAN: 48 mL/min — AB (ref >60)
Glucose, Bld: 123 mg/dL — ABNORMAL HIGH (ref 65–99)
Potassium: 4.6 mmol/L (ref 3.5–5.1)
SODIUM: 132 mmol/L — AB (ref 135–145)

## 2015-12-13 LAB — MAGNESIUM: MAGNESIUM: 1.8 mg/dL (ref 1.7–2.4)

## 2015-12-13 LAB — PHOSPHORUS: PHOSPHORUS: 3.2 mg/dL (ref 2.5–4.6)

## 2016-01-20 ENCOUNTER — Other Ambulatory Visit
Admission: RE | Admit: 2016-01-20 | Discharge: 2016-01-20 | Disposition: A | Discharge status: Home / Self Care | Payer: Medicare Other | Source: Ambulatory Visit | Attending: Nephrology | Admitting: Nephrology

## 2016-01-20 DIAGNOSIS — B259 Cytomegaloviral disease, unspecified: Secondary | ICD-10-CM | POA: Insufficient documentation

## 2016-01-20 DIAGNOSIS — E1129 Type 2 diabetes mellitus with other diabetic kidney complication: Secondary | ICD-10-CM | POA: Insufficient documentation

## 2016-01-20 DIAGNOSIS — D899 Disorder involving the immune mechanism, unspecified: Secondary | ICD-10-CM | POA: Insufficient documentation

## 2016-01-20 DIAGNOSIS — T8610 Unspecified complication of kidney transplant: Secondary | ICD-10-CM | POA: Insufficient documentation

## 2016-01-20 DIAGNOSIS — Z789 Other specified health status: Secondary | ICD-10-CM | POA: Insufficient documentation

## 2016-01-20 DIAGNOSIS — Z114 Encounter for screening for human immunodeficiency virus [HIV]: Secondary | ICD-10-CM | POA: Insufficient documentation

## 2016-01-20 DIAGNOSIS — D631 Anemia in chronic kidney disease: Secondary | ICD-10-CM | POA: Insufficient documentation

## 2016-01-20 DIAGNOSIS — Z9483 Pancreas transplant status: Secondary | ICD-10-CM | POA: Insufficient documentation

## 2016-01-20 DIAGNOSIS — N390 Urinary tract infection, site not specified: Secondary | ICD-10-CM | POA: Insufficient documentation

## 2016-01-20 DIAGNOSIS — Z79899 Other long term (current) drug therapy: Secondary | ICD-10-CM | POA: Insufficient documentation

## 2016-01-20 DIAGNOSIS — E559 Vitamin D deficiency, unspecified: Secondary | ICD-10-CM | POA: Diagnosis not present

## 2016-01-20 LAB — CBC WITH DIFFERENTIAL/PLATELET
BASOS ABS: 0 10*3/uL (ref 0–0.1)
BASOS PCT: 1 %
EOS ABS: 0 10*3/uL (ref 0–0.7)
EOS PCT: 1 %
HCT: 45.5 % (ref 40.0–52.0)
Hemoglobin: 14.6 g/dL (ref 13.0–18.0)
Lymphocytes Relative: 28 %
Lymphs Abs: 1.2 10*3/uL (ref 1.0–3.6)
MCH: 27 pg (ref 26.0–34.0)
MCHC: 32.2 g/dL (ref 32.0–36.0)
MCV: 83.9 fL (ref 80.0–100.0)
MONO ABS: 0.6 10*3/uL (ref 0.2–1.0)
Monocytes Relative: 15 %
Neutro Abs: 2.3 10*3/uL (ref 1.4–6.5)
Neutrophils Relative %: 55 %
PLATELETS: 164 10*3/uL (ref 150–440)
RBC: 5.42 MIL/uL (ref 4.40–5.90)
RDW: 13.9 % (ref 11.5–14.5)
WBC: 4.2 10*3/uL (ref 3.8–10.6)

## 2016-01-20 LAB — BASIC METABOLIC PANEL
Anion gap: 9 (ref 5–15)
BUN: 35 mg/dL — ABNORMAL HIGH (ref 6–20)
CALCIUM: 9.9 mg/dL (ref 8.9–10.3)
CO2: 25 mmol/L (ref 22–32)
Chloride: 102 mmol/L (ref 101–111)
Creatinine, Ser: 1.46 mg/dL — ABNORMAL HIGH (ref 0.61–1.24)
GFR, EST AFRICAN AMERICAN: 54 mL/min — AB (ref >60)
GFR, EST NON AFRICAN AMERICAN: 47 mL/min — AB (ref >60)
GLUCOSE: 144 mg/dL — AB (ref 65–99)
Potassium: 5.1 mmol/L (ref 3.5–5.1)
Sodium: 136 mmol/L (ref 135–145)

## 2016-01-20 LAB — MAGNESIUM: MAGNESIUM: 2.2 mg/dL (ref 1.7–2.4)

## 2016-01-20 LAB — PHOSPHORUS: PHOSPHORUS: 2.8 mg/dL (ref 2.5–4.6)

## 2016-01-22 LAB — TACROLIMUS LEVEL: TACROLIMUS (FK506) - LABCORP: 5.7 ng/mL (ref 2.0–20.0)

## 2016-02-17 ENCOUNTER — Encounter
Admission: RE | Admit: 2016-02-17 | Discharge: 2016-02-17 | Disposition: A | Discharge status: Home / Self Care | Payer: Medicare Other | Source: Ambulatory Visit | Attending: Nephrology | Admitting: Nephrology

## 2016-02-17 DIAGNOSIS — D631 Anemia in chronic kidney disease: Secondary | ICD-10-CM | POA: Insufficient documentation

## 2016-02-17 DIAGNOSIS — E559 Vitamin D deficiency, unspecified: Secondary | ICD-10-CM | POA: Insufficient documentation

## 2016-02-17 DIAGNOSIS — Z09 Encounter for follow-up examination after completed treatment for conditions other than malignant neoplasm: Secondary | ICD-10-CM | POA: Insufficient documentation

## 2016-02-17 DIAGNOSIS — E1129 Type 2 diabetes mellitus with other diabetic kidney complication: Secondary | ICD-10-CM | POA: Insufficient documentation

## 2016-02-17 DIAGNOSIS — D899 Disorder involving the immune mechanism, unspecified: Secondary | ICD-10-CM | POA: Diagnosis present

## 2016-02-17 DIAGNOSIS — Z789 Other specified health status: Secondary | ICD-10-CM | POA: Insufficient documentation

## 2016-02-17 DIAGNOSIS — N390 Urinary tract infection, site not specified: Secondary | ICD-10-CM | POA: Insufficient documentation

## 2016-02-17 DIAGNOSIS — Z79899 Other long term (current) drug therapy: Secondary | ICD-10-CM | POA: Diagnosis present

## 2016-02-17 DIAGNOSIS — Z9483 Pancreas transplant status: Secondary | ICD-10-CM | POA: Insufficient documentation

## 2016-02-17 DIAGNOSIS — Z940 Kidney transplant status: Secondary | ICD-10-CM | POA: Diagnosis present

## 2016-02-17 DIAGNOSIS — B259 Cytomegaloviral disease, unspecified: Secondary | ICD-10-CM | POA: Diagnosis not present

## 2016-02-17 DIAGNOSIS — Z114 Encounter for screening for human immunodeficiency virus [HIV]: Secondary | ICD-10-CM | POA: Diagnosis not present

## 2016-02-17 LAB — BASIC METABOLIC PANEL
ANION GAP: 4 — AB (ref 5–15)
BUN: 43 mg/dL — ABNORMAL HIGH (ref 6–20)
CHLORIDE: 105 mmol/L (ref 101–111)
CO2: 28 mmol/L (ref 22–32)
CREATININE: 1.49 mg/dL — AB (ref 0.61–1.24)
Calcium: 9.8 mg/dL (ref 8.9–10.3)
GFR calc non Af Amer: 46 mL/min — ABNORMAL LOW (ref >60)
GFR, EST AFRICAN AMERICAN: 53 mL/min — AB (ref >60)
Glucose, Bld: 114 mg/dL — ABNORMAL HIGH (ref 65–99)
POTASSIUM: 5.1 mmol/L (ref 3.5–5.1)
SODIUM: 137 mmol/L (ref 135–145)

## 2016-02-17 LAB — CBC WITH DIFFERENTIAL/PLATELET
BASOS ABS: 0 10*3/uL (ref 0–0.1)
BASOS PCT: 1 %
Eosinophils Absolute: 0 10*3/uL (ref 0–0.7)
Eosinophils Relative: 1 %
HEMATOCRIT: 39.3 % — AB (ref 40.0–52.0)
Hemoglobin: 12.8 g/dL — ABNORMAL LOW (ref 13.0–18.0)
LYMPHS PCT: 16 %
Lymphs Abs: 0.9 10*3/uL — ABNORMAL LOW (ref 1.0–3.6)
MCH: 26.7 pg (ref 26.0–34.0)
MCHC: 32.5 g/dL (ref 32.0–36.0)
MCV: 82.3 fL (ref 80.0–100.0)
MONO ABS: 0.9 10*3/uL (ref 0.2–1.0)
Monocytes Relative: 16 %
NEUTROS ABS: 3.8 10*3/uL (ref 1.4–6.5)
Neutrophils Relative %: 66 %
PLATELETS: 165 10*3/uL (ref 150–440)
RBC: 4.78 MIL/uL (ref 4.40–5.90)
RDW: 13.7 % (ref 11.5–14.5)
WBC: 5.8 10*3/uL (ref 3.8–10.6)

## 2016-02-17 LAB — PHOSPHORUS: PHOSPHORUS: 2.8 mg/dL (ref 2.5–4.6)

## 2016-02-17 LAB — MAGNESIUM: MAGNESIUM: 2 mg/dL (ref 1.7–2.4)

## 2016-04-20 ENCOUNTER — Other Ambulatory Visit
Admission: RE | Admit: 2016-04-20 | Discharge: 2016-04-20 | Disposition: A | Discharge status: Home / Self Care | Payer: Medicare Other | Source: Ambulatory Visit | Attending: Nephrology | Admitting: Nephrology

## 2016-04-20 DIAGNOSIS — Z79899 Other long term (current) drug therapy: Secondary | ICD-10-CM | POA: Insufficient documentation

## 2016-04-20 DIAGNOSIS — D899 Disorder involving the immune mechanism, unspecified: Secondary | ICD-10-CM | POA: Insufficient documentation

## 2016-04-20 DIAGNOSIS — E559 Vitamin D deficiency, unspecified: Secondary | ICD-10-CM | POA: Diagnosis not present

## 2016-04-20 DIAGNOSIS — D631 Anemia in chronic kidney disease: Secondary | ICD-10-CM | POA: Insufficient documentation

## 2016-04-20 DIAGNOSIS — Z940 Kidney transplant status: Secondary | ICD-10-CM | POA: Insufficient documentation

## 2016-04-20 DIAGNOSIS — Z9483 Pancreas transplant status: Secondary | ICD-10-CM | POA: Diagnosis not present

## 2016-04-20 DIAGNOSIS — E1129 Type 2 diabetes mellitus with other diabetic kidney complication: Secondary | ICD-10-CM | POA: Diagnosis not present

## 2016-04-20 LAB — CBC WITH DIFFERENTIAL/PLATELET
BASOS ABS: 0 10*3/uL (ref 0–0.1)
BASOS PCT: 1 %
EOS ABS: 0.1 10*3/uL (ref 0–0.7)
EOS PCT: 1 %
HEMATOCRIT: 40 % (ref 40.0–52.0)
Hemoglobin: 13.2 g/dL (ref 13.0–18.0)
Lymphocytes Relative: 21 %
Lymphs Abs: 0.9 10*3/uL — ABNORMAL LOW (ref 1.0–3.6)
MCH: 27.1 pg (ref 26.0–34.0)
MCHC: 32.9 g/dL (ref 32.0–36.0)
MCV: 82.4 fL (ref 80.0–100.0)
MONO ABS: 0.7 10*3/uL (ref 0.2–1.0)
MONOS PCT: 15 %
NEUTROS ABS: 2.8 10*3/uL (ref 1.4–6.5)
Neutrophils Relative %: 62 %
PLATELETS: 175 10*3/uL (ref 150–440)
RBC: 4.85 MIL/uL (ref 4.40–5.90)
RDW: 14.1 % (ref 11.5–14.5)
WBC: 4.4 10*3/uL (ref 3.8–10.6)

## 2016-04-20 LAB — BASIC METABOLIC PANEL
ANION GAP: 9 (ref 5–15)
BUN: 40 mg/dL — ABNORMAL HIGH (ref 6–20)
CALCIUM: 9.9 mg/dL (ref 8.9–10.3)
CO2: 27 mmol/L (ref 22–32)
CREATININE: 1.47 mg/dL — AB (ref 0.61–1.24)
Chloride: 101 mmol/L (ref 101–111)
GFR, EST AFRICAN AMERICAN: 54 mL/min — AB (ref >60)
GFR, EST NON AFRICAN AMERICAN: 47 mL/min — AB (ref >60)
GLUCOSE: 84 mg/dL (ref 65–99)
Potassium: 4.1 mmol/L (ref 3.5–5.1)
Sodium: 137 mmol/L (ref 135–145)

## 2016-04-20 LAB — MAGNESIUM: MAGNESIUM: 2.1 mg/dL (ref 1.7–2.4)

## 2016-04-20 LAB — PHOSPHORUS: PHOSPHORUS: 2.9 mg/dL (ref 2.5–4.6)

## 2016-05-23 ENCOUNTER — Other Ambulatory Visit
Admission: RE | Admit: 2016-05-23 | Discharge: 2016-05-23 | Disposition: A | Discharge status: Home / Self Care | Payer: Medicare Other | Source: Ambulatory Visit | Attending: Nephrology | Admitting: Nephrology

## 2016-05-23 DIAGNOSIS — D899 Disorder involving the immune mechanism, unspecified: Secondary | ICD-10-CM | POA: Insufficient documentation

## 2016-05-23 DIAGNOSIS — Z114 Encounter for screening for human immunodeficiency virus [HIV]: Secondary | ICD-10-CM | POA: Diagnosis not present

## 2016-05-23 DIAGNOSIS — Z79899 Other long term (current) drug therapy: Secondary | ICD-10-CM | POA: Insufficient documentation

## 2016-05-23 DIAGNOSIS — Z9483 Pancreas transplant status: Secondary | ICD-10-CM | POA: Diagnosis not present

## 2016-05-23 DIAGNOSIS — D631 Anemia in chronic kidney disease: Secondary | ICD-10-CM | POA: Insufficient documentation

## 2016-05-23 DIAGNOSIS — Z789 Other specified health status: Secondary | ICD-10-CM | POA: Diagnosis not present

## 2016-05-23 DIAGNOSIS — E1129 Type 2 diabetes mellitus with other diabetic kidney complication: Secondary | ICD-10-CM | POA: Diagnosis not present

## 2016-05-23 DIAGNOSIS — Z940 Kidney transplant status: Secondary | ICD-10-CM | POA: Insufficient documentation

## 2016-05-23 DIAGNOSIS — Z09 Encounter for follow-up examination after completed treatment for conditions other than malignant neoplasm: Secondary | ICD-10-CM | POA: Insufficient documentation

## 2016-05-23 DIAGNOSIS — N390 Urinary tract infection, site not specified: Secondary | ICD-10-CM | POA: Diagnosis not present

## 2016-05-23 DIAGNOSIS — B259 Cytomegaloviral disease, unspecified: Secondary | ICD-10-CM | POA: Diagnosis not present

## 2016-05-23 DIAGNOSIS — T8610 Unspecified complication of kidney transplant: Secondary | ICD-10-CM | POA: Diagnosis not present

## 2016-05-23 LAB — CBC WITH DIFFERENTIAL/PLATELET
BASOS PCT: 1 %
Basophils Absolute: 0 10*3/uL (ref 0–0.1)
EOS ABS: 0.1 10*3/uL (ref 0–0.7)
EOS PCT: 2 %
HCT: 42.5 % (ref 40.0–52.0)
Hemoglobin: 14 g/dL (ref 13.0–18.0)
LYMPHS ABS: 1.1 10*3/uL (ref 1.0–3.6)
Lymphocytes Relative: 26 %
MCH: 27.4 pg (ref 26.0–34.0)
MCHC: 32.8 g/dL (ref 32.0–36.0)
MCV: 83.5 fL (ref 80.0–100.0)
MONO ABS: 0.7 10*3/uL (ref 0.2–1.0)
MONOS PCT: 16 %
Neutro Abs: 2.3 10*3/uL (ref 1.4–6.5)
Neutrophils Relative %: 55 %
PLATELETS: 163 10*3/uL (ref 150–440)
RBC: 5.09 MIL/uL (ref 4.40–5.90)
RDW: 14.5 % (ref 11.5–14.5)
WBC: 4.2 10*3/uL (ref 3.8–10.6)

## 2016-05-23 LAB — BASIC METABOLIC PANEL
Anion gap: 7 (ref 5–15)
BUN: 27 mg/dL — AB (ref 6–20)
CALCIUM: 10.1 mg/dL (ref 8.9–10.3)
CO2: 28 mmol/L (ref 22–32)
Chloride: 102 mmol/L (ref 101–111)
Creatinine, Ser: 1.23 mg/dL (ref 0.61–1.24)
GFR calc Af Amer: >60 mL/min (ref >60)
GFR, EST NON AFRICAN AMERICAN: 58 mL/min — AB (ref >60)
GLUCOSE: 107 mg/dL — AB (ref 65–99)
Potassium: 4.6 mmol/L (ref 3.5–5.1)
SODIUM: 137 mmol/L (ref 135–145)

## 2016-05-23 LAB — PHOSPHORUS: PHOSPHORUS: 3.7 mg/dL (ref 2.5–4.6)

## 2016-05-23 LAB — MAGNESIUM: Magnesium: 1.8 mg/dL (ref 1.7–2.4)

## 2016-06-26 ENCOUNTER — Other Ambulatory Visit
Admission: RE | Admit: 2016-06-26 | Discharge: 2016-06-26 | Disposition: A | Discharge status: Home / Self Care | Payer: Medicare Other | Source: Ambulatory Visit | Attending: Nephrology | Admitting: Nephrology

## 2016-06-26 DIAGNOSIS — Z114 Encounter for screening for human immunodeficiency virus [HIV]: Secondary | ICD-10-CM | POA: Diagnosis not present

## 2016-06-26 DIAGNOSIS — Z789 Other specified health status: Secondary | ICD-10-CM | POA: Insufficient documentation

## 2016-06-26 DIAGNOSIS — D631 Anemia in chronic kidney disease: Secondary | ICD-10-CM | POA: Diagnosis not present

## 2016-06-26 DIAGNOSIS — E559 Vitamin D deficiency, unspecified: Secondary | ICD-10-CM | POA: Diagnosis not present

## 2016-06-26 DIAGNOSIS — Z940 Kidney transplant status: Secondary | ICD-10-CM | POA: Insufficient documentation

## 2016-06-26 DIAGNOSIS — Z09 Encounter for follow-up examination after completed treatment for conditions other than malignant neoplasm: Secondary | ICD-10-CM | POA: Diagnosis not present

## 2016-06-26 DIAGNOSIS — E1129 Type 2 diabetes mellitus with other diabetic kidney complication: Secondary | ICD-10-CM | POA: Insufficient documentation

## 2016-06-26 DIAGNOSIS — Z79899 Other long term (current) drug therapy: Secondary | ICD-10-CM | POA: Insufficient documentation

## 2016-06-26 DIAGNOSIS — N390 Urinary tract infection, site not specified: Secondary | ICD-10-CM | POA: Insufficient documentation

## 2016-06-26 DIAGNOSIS — Z9483 Pancreas transplant status: Secondary | ICD-10-CM | POA: Diagnosis not present

## 2016-06-26 DIAGNOSIS — T8610 Unspecified complication of kidney transplant: Secondary | ICD-10-CM | POA: Insufficient documentation

## 2016-06-26 DIAGNOSIS — B259 Cytomegaloviral disease, unspecified: Secondary | ICD-10-CM | POA: Diagnosis not present

## 2016-06-26 DIAGNOSIS — D899 Disorder involving the immune mechanism, unspecified: Secondary | ICD-10-CM | POA: Insufficient documentation

## 2016-06-26 LAB — CBC WITH DIFFERENTIAL/PLATELET
Basophils Absolute: 0 10*3/uL (ref 0–0.1)
Basophils Relative: 1 %
Eosinophils Absolute: 0 10*3/uL (ref 0–0.7)
Eosinophils Relative: 0 %
HCT: 44.9 % (ref 40.0–52.0)
Hemoglobin: 14.7 g/dL (ref 13.0–18.0)
Lymphocytes Relative: 23 %
Lymphs Abs: 1.2 10*3/uL (ref 1.0–3.6)
MCH: 26.8 pg (ref 26.0–34.0)
MCHC: 32.8 g/dL (ref 32.0–36.0)
MCV: 81.7 fL (ref 80.0–100.0)
Monocytes Absolute: 0.7 10*3/uL (ref 0.2–1.0)
Monocytes Relative: 13 %
Neutro Abs: 3.5 10*3/uL (ref 1.4–6.5)
Neutrophils Relative %: 63 %
Platelets: 172 10*3/uL (ref 150–440)
RBC: 5.49 MIL/uL (ref 4.40–5.90)
RDW: 14.1 % (ref 11.5–14.5)
WBC: 5.5 10*3/uL (ref 3.8–10.6)

## 2016-06-26 LAB — BASIC METABOLIC PANEL
Anion gap: 8 (ref 5–15)
BUN: 38 mg/dL — ABNORMAL HIGH (ref 6–20)
CO2: 26 mmol/L (ref 22–32)
Calcium: 9.9 mg/dL (ref 8.9–10.3)
Chloride: 104 mmol/L (ref 101–111)
Creatinine, Ser: 1.44 mg/dL — ABNORMAL HIGH (ref 0.61–1.24)
GFR calc Af Amer: 55 mL/min — ABNORMAL LOW (ref >60)
GFR calc non Af Amer: 48 mL/min — ABNORMAL LOW (ref >60)
Glucose, Bld: 102 mg/dL — ABNORMAL HIGH (ref 65–99)
Potassium: 4.2 mmol/L (ref 3.5–5.1)
Sodium: 138 mmol/L (ref 135–145)

## 2016-06-26 LAB — MAGNESIUM: MAGNESIUM: 1.8 mg/dL (ref 1.7–2.4)

## 2016-06-26 LAB — PHOSPHORUS: Phosphorus: 2.6 mg/dL (ref 2.5–4.6)

## 2016-08-21 ENCOUNTER — Other Ambulatory Visit
Admission: RE | Admit: 2016-08-21 | Discharge: 2016-08-21 | Disposition: A | Discharge status: Home / Self Care | Payer: Medicare Other | Source: Other Acute Inpatient Hospital | Attending: Rheumatology | Admitting: Rheumatology

## 2016-08-21 DIAGNOSIS — M25562 Pain in left knee: Secondary | ICD-10-CM | POA: Insufficient documentation

## 2016-08-21 LAB — SYNOVIAL CELL COUNT + DIFF, W/ CRYSTALS
EOSINOPHILS-SYNOVIAL: 0 %
Lymphocytes-Synovial Fld: 5 %
Monocyte-Macrophage-Synovial Fluid: 19 %
NEUTROPHIL, SYNOVIAL: 76 %
Other Cells-SYN: 0
WBC, SYNOVIAL: 337 /mm3 — AB (ref 0–200)

## 2016-08-27 ENCOUNTER — Encounter
Admission: RE | Admit: 2016-08-27 | Discharge: 2016-08-27 | Disposition: A | Discharge status: Home / Self Care | Payer: Medicare Other | Source: Ambulatory Visit | Attending: Nephrology | Admitting: Nephrology

## 2016-08-27 DIAGNOSIS — Z79899 Other long term (current) drug therapy: Secondary | ICD-10-CM | POA: Diagnosis present

## 2016-08-27 DIAGNOSIS — E1129 Type 2 diabetes mellitus with other diabetic kidney complication: Secondary | ICD-10-CM | POA: Diagnosis not present

## 2016-08-27 DIAGNOSIS — B259 Cytomegaloviral disease, unspecified: Secondary | ICD-10-CM | POA: Insufficient documentation

## 2016-08-27 DIAGNOSIS — Z940 Kidney transplant status: Secondary | ICD-10-CM | POA: Insufficient documentation

## 2016-08-27 DIAGNOSIS — Z114 Encounter for screening for human immunodeficiency virus [HIV]: Secondary | ICD-10-CM | POA: Insufficient documentation

## 2016-08-27 DIAGNOSIS — Z789 Other specified health status: Secondary | ICD-10-CM | POA: Diagnosis not present

## 2016-08-27 DIAGNOSIS — N390 Urinary tract infection, site not specified: Secondary | ICD-10-CM | POA: Diagnosis not present

## 2016-08-27 DIAGNOSIS — D899 Disorder involving the immune mechanism, unspecified: Secondary | ICD-10-CM | POA: Diagnosis present

## 2016-08-27 DIAGNOSIS — Z9489 Other transplanted organ and tissue status: Secondary | ICD-10-CM | POA: Diagnosis not present

## 2016-08-27 DIAGNOSIS — D631 Anemia in chronic kidney disease: Secondary | ICD-10-CM | POA: Diagnosis not present

## 2016-08-27 DIAGNOSIS — Z09 Encounter for follow-up examination after completed treatment for conditions other than malignant neoplasm: Secondary | ICD-10-CM | POA: Diagnosis not present

## 2016-08-27 DIAGNOSIS — E559 Vitamin D deficiency, unspecified: Secondary | ICD-10-CM | POA: Diagnosis not present

## 2016-08-27 LAB — MAGNESIUM: MAGNESIUM: 1.7 mg/dL (ref 1.7–2.4)

## 2016-08-27 LAB — CBC WITH DIFFERENTIAL/PLATELET
Basophils Absolute: 0 10*3/uL (ref 0–0.1)
Basophils Relative: 0 %
Eosinophils Absolute: 0 10*3/uL (ref 0–0.7)
Eosinophils Relative: 1 %
HCT: 45.8 % (ref 40.0–52.0)
Hemoglobin: 14.5 g/dL (ref 13.0–18.0)
Lymphocytes Relative: 27 %
Lymphs Abs: 1.2 10*3/uL (ref 1.0–3.6)
MCH: 26.4 pg (ref 26.0–34.0)
MCHC: 31.8 g/dL — ABNORMAL LOW (ref 32.0–36.0)
MCV: 83 fL (ref 80.0–100.0)
Monocytes Absolute: 0.6 10*3/uL (ref 0.2–1.0)
Monocytes Relative: 15 %
Neutro Abs: 2.5 10*3/uL (ref 1.4–6.5)
Neutrophils Relative %: 57 %
Platelets: 147 10*3/uL — ABNORMAL LOW (ref 150–440)
RBC: 5.52 MIL/uL (ref 4.40–5.90)
RDW: 14.2 % (ref 11.5–14.5)
WBC: 4.3 10*3/uL (ref 3.8–10.6)

## 2016-08-27 LAB — BASIC METABOLIC PANEL
Anion gap: 8 (ref 5–15)
BUN: 28 mg/dL — ABNORMAL HIGH (ref 6–20)
CO2: 27 mmol/L (ref 22–32)
Calcium: 10 mg/dL (ref 8.9–10.3)
Chloride: 101 mmol/L (ref 101–111)
Creatinine, Ser: 1.26 mg/dL — ABNORMAL HIGH (ref 0.61–1.24)
GFR calc Af Amer: >60 mL/min (ref >60)
GFR calc non Af Amer: 56 mL/min — ABNORMAL LOW (ref >60)
Glucose, Bld: 96 mg/dL (ref 65–99)
Potassium: 4.2 mmol/L (ref 3.5–5.1)
Sodium: 136 mmol/L (ref 135–145)

## 2016-08-27 LAB — PHOSPHORUS: PHOSPHORUS: 3.3 mg/dL (ref 2.5–4.6)

## 2016-09-25 ENCOUNTER — Other Ambulatory Visit
Admission: RE | Admit: 2016-09-25 | Discharge: 2016-09-25 | Disposition: A | Discharge status: Home / Self Care | Payer: Medicare Other | Source: Ambulatory Visit | Attending: Nephrology | Admitting: Nephrology

## 2016-09-25 DIAGNOSIS — D631 Anemia in chronic kidney disease: Secondary | ICD-10-CM | POA: Insufficient documentation

## 2016-09-25 DIAGNOSIS — E1129 Type 2 diabetes mellitus with other diabetic kidney complication: Secondary | ICD-10-CM | POA: Insufficient documentation

## 2016-09-25 DIAGNOSIS — B259 Cytomegaloviral disease, unspecified: Secondary | ICD-10-CM | POA: Diagnosis not present

## 2016-09-25 DIAGNOSIS — Z9483 Pancreas transplant status: Secondary | ICD-10-CM | POA: Insufficient documentation

## 2016-09-25 DIAGNOSIS — N390 Urinary tract infection, site not specified: Secondary | ICD-10-CM | POA: Diagnosis not present

## 2016-09-25 DIAGNOSIS — Z940 Kidney transplant status: Secondary | ICD-10-CM | POA: Insufficient documentation

## 2016-09-25 DIAGNOSIS — Z789 Other specified health status: Secondary | ICD-10-CM | POA: Diagnosis not present

## 2016-09-25 DIAGNOSIS — Z09 Encounter for follow-up examination after completed treatment for conditions other than malignant neoplasm: Secondary | ICD-10-CM | POA: Insufficient documentation

## 2016-09-25 DIAGNOSIS — D899 Disorder involving the immune mechanism, unspecified: Secondary | ICD-10-CM | POA: Diagnosis present

## 2016-09-25 DIAGNOSIS — Z114 Encounter for screening for human immunodeficiency virus [HIV]: Secondary | ICD-10-CM | POA: Diagnosis not present

## 2016-09-25 DIAGNOSIS — E559 Vitamin D deficiency, unspecified: Secondary | ICD-10-CM | POA: Diagnosis not present

## 2016-09-25 DIAGNOSIS — Z79899 Other long term (current) drug therapy: Secondary | ICD-10-CM | POA: Diagnosis present

## 2016-09-25 LAB — BASIC METABOLIC PANEL
Anion gap: 9 (ref 5–15)
BUN: 36 mg/dL — AB (ref 6–20)
CHLORIDE: 103 mmol/L (ref 101–111)
CO2: 26 mmol/L (ref 22–32)
Calcium: 10.5 mg/dL — ABNORMAL HIGH (ref 8.9–10.3)
Creatinine, Ser: 1.51 mg/dL — ABNORMAL HIGH (ref 0.61–1.24)
GFR calc Af Amer: 52 mL/min — ABNORMAL LOW (ref >60)
GFR calc non Af Amer: 45 mL/min — ABNORMAL LOW (ref >60)
GLUCOSE: 99 mg/dL (ref 65–99)
POTASSIUM: 4.3 mmol/L (ref 3.5–5.1)
SODIUM: 138 mmol/L (ref 135–145)

## 2016-09-25 LAB — CBC WITH DIFFERENTIAL/PLATELET
BASOS PCT: 1 %
Basophils Absolute: 0 10*3/uL (ref 0–0.1)
Eosinophils Absolute: 0 10*3/uL (ref 0–0.7)
Eosinophils Relative: 1 %
HEMATOCRIT: 46.3 % (ref 40.0–52.0)
HEMOGLOBIN: 15 g/dL (ref 13.0–18.0)
Lymphocytes Relative: 24 %
Lymphs Abs: 1.1 10*3/uL (ref 1.0–3.6)
MCH: 26.8 pg (ref 26.0–34.0)
MCHC: 32.5 g/dL (ref 32.0–36.0)
MCV: 82.7 fL (ref 80.0–100.0)
MONOS PCT: 15 %
Monocytes Absolute: 0.7 10*3/uL (ref 0.2–1.0)
NEUTROS ABS: 2.7 10*3/uL (ref 1.4–6.5)
NEUTROS PCT: 59 %
Platelets: 177 10*3/uL (ref 150–440)
RBC: 5.59 MIL/uL (ref 4.40–5.90)
RDW: 14.8 % — ABNORMAL HIGH (ref 11.5–14.5)
WBC: 4.5 10*3/uL (ref 3.8–10.6)

## 2016-09-25 LAB — PHOSPHORUS: PHOSPHORUS: 3.9 mg/dL (ref 2.5–4.6)

## 2016-09-25 LAB — MAGNESIUM: Magnesium: 1.7 mg/dL (ref 1.7–2.4)

## 2016-10-26 ENCOUNTER — Other Ambulatory Visit
Admission: RE | Admit: 2016-10-26 | Discharge: 2016-10-26 | Disposition: A | Discharge status: Home / Self Care | Payer: Medicare Other | Source: Ambulatory Visit | Attending: Nephrology | Admitting: Nephrology

## 2016-10-26 DIAGNOSIS — Z940 Kidney transplant status: Secondary | ICD-10-CM | POA: Insufficient documentation

## 2016-10-26 DIAGNOSIS — Z79899 Other long term (current) drug therapy: Secondary | ICD-10-CM | POA: Diagnosis not present

## 2016-10-26 DIAGNOSIS — E1129 Type 2 diabetes mellitus with other diabetic kidney complication: Secondary | ICD-10-CM | POA: Diagnosis not present

## 2016-10-26 DIAGNOSIS — Z114 Encounter for screening for human immunodeficiency virus [HIV]: Secondary | ICD-10-CM | POA: Diagnosis not present

## 2016-10-26 DIAGNOSIS — Z09 Encounter for follow-up examination after completed treatment for conditions other than malignant neoplasm: Secondary | ICD-10-CM | POA: Diagnosis not present

## 2016-10-26 DIAGNOSIS — N390 Urinary tract infection, site not specified: Secondary | ICD-10-CM | POA: Diagnosis not present

## 2016-10-26 DIAGNOSIS — D631 Anemia in chronic kidney disease: Secondary | ICD-10-CM | POA: Diagnosis not present

## 2016-10-26 DIAGNOSIS — E559 Vitamin D deficiency, unspecified: Secondary | ICD-10-CM | POA: Diagnosis not present

## 2016-10-26 DIAGNOSIS — Z9483 Pancreas transplant status: Secondary | ICD-10-CM | POA: Insufficient documentation

## 2016-10-26 LAB — BASIC METABOLIC PANEL
Anion gap: 7 (ref 5–15)
BUN: 42 mg/dL — AB (ref 6–20)
CALCIUM: 10 mg/dL (ref 8.9–10.3)
CHLORIDE: 103 mmol/L (ref 101–111)
CO2: 27 mmol/L (ref 22–32)
CREATININE: 1.5 mg/dL — AB (ref 0.61–1.24)
GFR calc non Af Amer: 45 mL/min — ABNORMAL LOW (ref >60)
GFR, EST AFRICAN AMERICAN: 52 mL/min — AB (ref >60)
GLUCOSE: 98 mg/dL (ref 65–99)
Potassium: 4.6 mmol/L (ref 3.5–5.1)
Sodium: 137 mmol/L (ref 135–145)

## 2016-10-26 LAB — CBC WITH DIFFERENTIAL/PLATELET
BASOS ABS: 0 10*3/uL (ref 0–0.1)
Basophils Relative: 1 %
EOS PCT: 0 %
Eosinophils Absolute: 0 10*3/uL (ref 0–0.7)
HEMATOCRIT: 45.6 % (ref 40.0–52.0)
Hemoglobin: 14.8 g/dL (ref 13.0–18.0)
LYMPHS ABS: 1 10*3/uL (ref 1.0–3.6)
LYMPHS PCT: 18 %
MCH: 27.1 pg (ref 26.0–34.0)
MCHC: 32.5 g/dL (ref 32.0–36.0)
MCV: 83.3 fL (ref 80.0–100.0)
MONO ABS: 0.7 10*3/uL (ref 0.2–1.0)
MONOS PCT: 13 %
NEUTROS ABS: 3.8 10*3/uL (ref 1.4–6.5)
Neutrophils Relative %: 68 %
PLATELETS: 176 10*3/uL (ref 150–440)
RBC: 5.48 MIL/uL (ref 4.40–5.90)
RDW: 14.5 % (ref 11.5–14.5)
WBC: 5.6 10*3/uL (ref 3.8–10.6)

## 2016-10-26 LAB — PHOSPHORUS: Phosphorus: 2.7 mg/dL (ref 2.5–4.6)

## 2016-10-26 LAB — MAGNESIUM: Magnesium: 2.2 mg/dL (ref 1.7–2.4)

## 2017-01-03 ENCOUNTER — Other Ambulatory Visit
Admission: RE | Admit: 2017-01-03 | Discharge: 2017-01-03 | Disposition: A | Discharge status: Home / Self Care | Payer: Medicare Other | Source: Ambulatory Visit | Attending: Nephrology | Admitting: Nephrology

## 2017-01-03 DIAGNOSIS — Z9483 Pancreas transplant status: Secondary | ICD-10-CM | POA: Diagnosis not present

## 2017-01-03 DIAGNOSIS — D899 Disorder involving the immune mechanism, unspecified: Secondary | ICD-10-CM | POA: Insufficient documentation

## 2017-01-03 DIAGNOSIS — N390 Urinary tract infection, site not specified: Secondary | ICD-10-CM | POA: Diagnosis not present

## 2017-01-03 DIAGNOSIS — D631 Anemia in chronic kidney disease: Secondary | ICD-10-CM | POA: Diagnosis not present

## 2017-01-03 DIAGNOSIS — Z940 Kidney transplant status: Secondary | ICD-10-CM | POA: Diagnosis not present

## 2017-01-03 DIAGNOSIS — B259 Cytomegaloviral disease, unspecified: Secondary | ICD-10-CM | POA: Insufficient documentation

## 2017-01-03 DIAGNOSIS — Z79899 Other long term (current) drug therapy: Secondary | ICD-10-CM | POA: Diagnosis not present

## 2017-01-03 DIAGNOSIS — Z789 Other specified health status: Secondary | ICD-10-CM | POA: Insufficient documentation

## 2017-01-03 DIAGNOSIS — E1129 Type 2 diabetes mellitus with other diabetic kidney complication: Secondary | ICD-10-CM | POA: Diagnosis not present

## 2017-01-03 DIAGNOSIS — Z114 Encounter for screening for human immunodeficiency virus [HIV]: Secondary | ICD-10-CM | POA: Insufficient documentation

## 2017-01-03 DIAGNOSIS — E559 Vitamin D deficiency, unspecified: Secondary | ICD-10-CM | POA: Diagnosis not present

## 2017-01-03 LAB — BASIC METABOLIC PANEL
ANION GAP: 6 (ref 5–15)
BUN: 35 mg/dL — ABNORMAL HIGH (ref 6–20)
CALCIUM: 9.7 mg/dL (ref 8.9–10.3)
CO2: 27 mmol/L (ref 22–32)
Chloride: 102 mmol/L (ref 101–111)
Creatinine, Ser: 1.16 mg/dL (ref 0.61–1.24)
GFR calc non Af Amer: >60 mL/min (ref >60)
Glucose, Bld: 132 mg/dL — ABNORMAL HIGH (ref 65–99)
POTASSIUM: 4.6 mmol/L (ref 3.5–5.1)
Sodium: 135 mmol/L (ref 135–145)

## 2017-01-03 LAB — CBC WITH DIFFERENTIAL/PLATELET
BASOS ABS: 0 10*3/uL (ref 0–0.1)
Basophils Relative: 0 %
EOS ABS: 0 10*3/uL (ref 0–0.7)
EOS PCT: 0 %
HCT: 42.5 % (ref 40.0–52.0)
HEMOGLOBIN: 13.8 g/dL (ref 13.0–18.0)
LYMPHS PCT: 19 %
Lymphs Abs: 1.1 10*3/uL (ref 1.0–3.6)
MCH: 27.1 pg (ref 26.0–34.0)
MCHC: 32.5 g/dL (ref 32.0–36.0)
MCV: 83.5 fL (ref 80.0–100.0)
Monocytes Absolute: 0.7 10*3/uL (ref 0.2–1.0)
Monocytes Relative: 13 %
NEUTROS PCT: 68 %
Neutro Abs: 3.9 10*3/uL (ref 1.4–6.5)
Platelets: 169 10*3/uL (ref 150–440)
RBC: 5.09 MIL/uL (ref 4.40–5.90)
RDW: 14 % (ref 11.5–14.5)
WBC: 5.7 10*3/uL (ref 3.8–10.6)

## 2017-01-03 LAB — PHOSPHORUS: Phosphorus: 3.1 mg/dL (ref 2.5–4.6)

## 2017-01-03 LAB — MAGNESIUM: MAGNESIUM: 1.7 mg/dL (ref 1.7–2.4)

## 2017-02-21 ENCOUNTER — Other Ambulatory Visit
Admission: RE | Admit: 2017-02-21 | Discharge: 2017-02-21 | Disposition: A | Discharge status: Home / Self Care | Payer: Medicare Other | Source: Ambulatory Visit | Attending: Nephrology | Admitting: Nephrology

## 2017-02-21 DIAGNOSIS — Z789 Other specified health status: Secondary | ICD-10-CM | POA: Diagnosis not present

## 2017-02-21 DIAGNOSIS — Z114 Encounter for screening for human immunodeficiency virus [HIV]: Secondary | ICD-10-CM | POA: Insufficient documentation

## 2017-02-21 DIAGNOSIS — Z9483 Pancreas transplant status: Secondary | ICD-10-CM | POA: Insufficient documentation

## 2017-02-21 DIAGNOSIS — D899 Disorder involving the immune mechanism, unspecified: Secondary | ICD-10-CM | POA: Diagnosis present

## 2017-02-21 DIAGNOSIS — T8610 Unspecified complication of kidney transplant: Secondary | ICD-10-CM | POA: Diagnosis not present

## 2017-02-21 DIAGNOSIS — N390 Urinary tract infection, site not specified: Secondary | ICD-10-CM | POA: Diagnosis not present

## 2017-02-21 DIAGNOSIS — E1129 Type 2 diabetes mellitus with other diabetic kidney complication: Secondary | ICD-10-CM | POA: Insufficient documentation

## 2017-02-21 DIAGNOSIS — D631 Anemia in chronic kidney disease: Secondary | ICD-10-CM | POA: Diagnosis not present

## 2017-02-21 DIAGNOSIS — Z79899 Other long term (current) drug therapy: Secondary | ICD-10-CM | POA: Diagnosis not present

## 2017-02-21 DIAGNOSIS — Z940 Kidney transplant status: Secondary | ICD-10-CM | POA: Insufficient documentation

## 2017-02-21 DIAGNOSIS — B259 Cytomegaloviral disease, unspecified: Secondary | ICD-10-CM | POA: Insufficient documentation

## 2017-02-21 DIAGNOSIS — Z09 Encounter for follow-up examination after completed treatment for conditions other than malignant neoplasm: Secondary | ICD-10-CM | POA: Diagnosis present

## 2017-02-21 DIAGNOSIS — Z559 Problems related to education and literacy, unspecified: Secondary | ICD-10-CM | POA: Diagnosis not present

## 2017-02-21 LAB — CBC WITH DIFFERENTIAL/PLATELET
BASOS ABS: 0 10*3/uL (ref 0–0.1)
Basophils Relative: 0 %
Eosinophils Absolute: 0 10*3/uL (ref 0–0.7)
Eosinophils Relative: 0 %
HCT: 38.5 % — ABNORMAL LOW (ref 40.0–52.0)
Hemoglobin: 12.6 g/dL — ABNORMAL LOW (ref 13.0–18.0)
LYMPHS PCT: 18 %
Lymphs Abs: 1 10*3/uL (ref 1.0–3.6)
MCH: 26.9 pg (ref 26.0–34.0)
MCHC: 32.7 g/dL (ref 32.0–36.0)
MCV: 82.1 fL (ref 80.0–100.0)
MONO ABS: 0.8 10*3/uL (ref 0.2–1.0)
Monocytes Relative: 15 %
Neutro Abs: 3.7 10*3/uL (ref 1.4–6.5)
Neutrophils Relative %: 67 %
PLATELETS: 183 10*3/uL (ref 150–440)
RBC: 4.69 MIL/uL (ref 4.40–5.90)
RDW: 14.3 % (ref 11.5–14.5)
WBC: 5.5 10*3/uL (ref 3.8–10.6)

## 2017-02-21 LAB — BASIC METABOLIC PANEL
ANION GAP: 7 (ref 5–15)
BUN: 37 mg/dL — ABNORMAL HIGH (ref 6–20)
CHLORIDE: 103 mmol/L (ref 101–111)
CO2: 26 mmol/L (ref 22–32)
Calcium: 9.5 mg/dL (ref 8.9–10.3)
Creatinine, Ser: 1.64 mg/dL — ABNORMAL HIGH (ref 0.61–1.24)
GFR calc Af Amer: 47 mL/min — ABNORMAL LOW (ref >60)
GFR, EST NON AFRICAN AMERICAN: 40 mL/min — AB (ref >60)
GLUCOSE: 132 mg/dL — AB (ref 65–99)
POTASSIUM: 4.9 mmol/L (ref 3.5–5.1)
Sodium: 136 mmol/L (ref 135–145)

## 2017-02-21 LAB — MAGNESIUM: Magnesium: 2 mg/dL (ref 1.7–2.4)

## 2017-02-21 LAB — PHOSPHORUS: PHOSPHORUS: 3.2 mg/dL (ref 2.5–4.6)

## 2017-05-16 ENCOUNTER — Other Ambulatory Visit
Admission: RE | Admit: 2017-05-16 | Discharge: 2017-05-16 | Disposition: A | Discharge status: Home / Self Care | Payer: Medicare Other | Source: Ambulatory Visit | Attending: Nephrology | Admitting: Nephrology

## 2017-05-16 DIAGNOSIS — E559 Vitamin D deficiency, unspecified: Secondary | ICD-10-CM | POA: Diagnosis not present

## 2017-05-16 DIAGNOSIS — T8610 Unspecified complication of kidney transplant: Secondary | ICD-10-CM | POA: Insufficient documentation

## 2017-05-16 DIAGNOSIS — D631 Anemia in chronic kidney disease: Secondary | ICD-10-CM | POA: Insufficient documentation

## 2017-05-16 DIAGNOSIS — Z9483 Pancreas transplant status: Secondary | ICD-10-CM | POA: Insufficient documentation

## 2017-05-16 DIAGNOSIS — E1129 Type 2 diabetes mellitus with other diabetic kidney complication: Secondary | ICD-10-CM | POA: Diagnosis not present

## 2017-05-16 DIAGNOSIS — Z940 Kidney transplant status: Secondary | ICD-10-CM | POA: Diagnosis not present

## 2017-05-16 DIAGNOSIS — Z09 Encounter for follow-up examination after completed treatment for conditions other than malignant neoplasm: Secondary | ICD-10-CM | POA: Diagnosis not present

## 2017-05-16 DIAGNOSIS — D899 Disorder involving the immune mechanism, unspecified: Secondary | ICD-10-CM | POA: Insufficient documentation

## 2017-05-16 DIAGNOSIS — B259 Cytomegaloviral disease, unspecified: Secondary | ICD-10-CM | POA: Diagnosis not present

## 2017-05-16 DIAGNOSIS — Z114 Encounter for screening for human immunodeficiency virus [HIV]: Secondary | ICD-10-CM | POA: Diagnosis not present

## 2017-05-16 DIAGNOSIS — N390 Urinary tract infection, site not specified: Secondary | ICD-10-CM | POA: Insufficient documentation

## 2017-05-16 DIAGNOSIS — Z789 Other specified health status: Secondary | ICD-10-CM | POA: Insufficient documentation

## 2017-05-16 DIAGNOSIS — Z79899 Other long term (current) drug therapy: Secondary | ICD-10-CM | POA: Diagnosis not present

## 2017-05-16 LAB — CBC WITH DIFFERENTIAL/PLATELET
Basophils Absolute: 0 10*3/uL (ref 0–0.1)
Basophils Relative: 1 %
EOS ABS: 0 10*3/uL (ref 0–0.7)
Eosinophils Relative: 0 %
HCT: 38.1 % — ABNORMAL LOW (ref 40.0–52.0)
HEMOGLOBIN: 12.4 g/dL — AB (ref 13.0–18.0)
LYMPHS ABS: 0.9 10*3/uL — AB (ref 1.0–3.6)
Lymphocytes Relative: 17 %
MCH: 26.9 pg (ref 26.0–34.0)
MCHC: 32.5 g/dL (ref 32.0–36.0)
MCV: 82.7 fL (ref 80.0–100.0)
MONO ABS: 0.7 10*3/uL (ref 0.2–1.0)
MONOS PCT: 13 %
NEUTROS PCT: 69 %
Neutro Abs: 3.8 10*3/uL (ref 1.4–6.5)
Platelets: 178 10*3/uL (ref 150–440)
RBC: 4.61 MIL/uL (ref 4.40–5.90)
RDW: 14.5 % (ref 11.5–14.5)
WBC: 5.4 10*3/uL (ref 3.8–10.6)

## 2017-05-16 LAB — BASIC METABOLIC PANEL
Anion gap: 7 (ref 5–15)
BUN: 43 mg/dL — AB (ref 6–20)
CO2: 26 mmol/L (ref 22–32)
CREATININE: 1.43 mg/dL — AB (ref 0.61–1.24)
Calcium: 9.9 mg/dL (ref 8.9–10.3)
Chloride: 105 mmol/L (ref 101–111)
GFR calc Af Amer: 55 mL/min — ABNORMAL LOW (ref >60)
GFR, EST NON AFRICAN AMERICAN: 48 mL/min — AB (ref >60)
GLUCOSE: 136 mg/dL — AB (ref 65–99)
POTASSIUM: 5.1 mmol/L (ref 3.5–5.1)
SODIUM: 138 mmol/L (ref 135–145)

## 2017-05-16 LAB — PHOSPHORUS: Phosphorus: 2.7 mg/dL (ref 2.5–4.6)

## 2017-05-16 LAB — MAGNESIUM: Magnesium: 2 mg/dL (ref 1.7–2.4)

## 2017-06-11 ENCOUNTER — Other Ambulatory Visit
Admission: RE | Admit: 2017-06-11 | Discharge: 2017-06-11 | Disposition: A | Discharge status: Home / Self Care | Payer: Medicare Other | Source: Ambulatory Visit | Attending: Nephrology | Admitting: Nephrology

## 2017-06-11 DIAGNOSIS — Z940 Kidney transplant status: Secondary | ICD-10-CM | POA: Insufficient documentation

## 2017-06-11 DIAGNOSIS — D899 Disorder involving the immune mechanism, unspecified: Secondary | ICD-10-CM | POA: Insufficient documentation

## 2017-06-11 DIAGNOSIS — Z09 Encounter for follow-up examination after completed treatment for conditions other than malignant neoplasm: Secondary | ICD-10-CM | POA: Insufficient documentation

## 2017-06-11 DIAGNOSIS — D631 Anemia in chronic kidney disease: Secondary | ICD-10-CM | POA: Diagnosis not present

## 2017-06-11 DIAGNOSIS — B259 Cytomegaloviral disease, unspecified: Secondary | ICD-10-CM | POA: Insufficient documentation

## 2017-06-11 DIAGNOSIS — E1129 Type 2 diabetes mellitus with other diabetic kidney complication: Secondary | ICD-10-CM | POA: Insufficient documentation

## 2017-06-11 LAB — BASIC METABOLIC PANEL
Anion gap: 12 (ref 5–15)
BUN: 40 mg/dL — AB (ref 6–20)
CHLORIDE: 105 mmol/L (ref 101–111)
CO2: 23 mmol/L (ref 22–32)
Calcium: 10.6 mg/dL — ABNORMAL HIGH (ref 8.9–10.3)
Creatinine, Ser: 1.79 mg/dL — ABNORMAL HIGH (ref 0.61–1.24)
GFR calc Af Amer: 42 mL/min — ABNORMAL LOW (ref >60)
GFR calc non Af Amer: 36 mL/min — ABNORMAL LOW (ref >60)
GLUCOSE: 96 mg/dL (ref 65–99)
POTASSIUM: 3.7 mmol/L (ref 3.5–5.1)
Sodium: 140 mmol/L (ref 135–145)

## 2017-06-11 LAB — CBC WITH DIFFERENTIAL/PLATELET
Basophils Absolute: 0 10*3/uL (ref 0–0.1)
Basophils Relative: 1 %
Eosinophils Absolute: 0 10*3/uL (ref 0–0.7)
Eosinophils Relative: 1 %
HCT: 42.8 % (ref 40.0–52.0)
HEMOGLOBIN: 14.2 g/dL (ref 13.0–18.0)
LYMPHS ABS: 1.3 10*3/uL (ref 1.0–3.6)
Lymphocytes Relative: 23 %
MCH: 26.8 pg (ref 26.0–34.0)
MCHC: 33 g/dL (ref 32.0–36.0)
MCV: 81.2 fL (ref 80.0–100.0)
MONOS PCT: 12 %
Monocytes Absolute: 0.7 10*3/uL (ref 0.2–1.0)
NEUTROS ABS: 3.7 10*3/uL (ref 1.4–6.5)
NEUTROS PCT: 63 %
Platelets: 180 10*3/uL (ref 150–440)
RBC: 5.28 MIL/uL (ref 4.40–5.90)
RDW: 14 % (ref 11.5–14.5)
WBC: 5.9 10*3/uL (ref 3.8–10.6)

## 2017-06-11 LAB — MAGNESIUM: Magnesium: 2.1 mg/dL (ref 1.7–2.4)

## 2017-06-11 LAB — PHOSPHORUS: Phosphorus: 3.9 mg/dL (ref 2.5–4.6)

## 2017-08-21 ENCOUNTER — Other Ambulatory Visit
Admission: RE | Admit: 2017-08-21 | Discharge: 2017-08-21 | Disposition: A | Discharge status: Home / Self Care | Payer: Medicare Other | Source: Ambulatory Visit | Attending: Nephrology | Admitting: Nephrology

## 2017-08-21 DIAGNOSIS — N390 Urinary tract infection, site not specified: Secondary | ICD-10-CM | POA: Diagnosis not present

## 2017-08-21 DIAGNOSIS — Z940 Kidney transplant status: Secondary | ICD-10-CM | POA: Insufficient documentation

## 2017-08-21 DIAGNOSIS — D631 Anemia in chronic kidney disease: Secondary | ICD-10-CM | POA: Insufficient documentation

## 2017-08-21 DIAGNOSIS — N189 Chronic kidney disease, unspecified: Secondary | ICD-10-CM | POA: Diagnosis not present

## 2017-08-21 DIAGNOSIS — X58XXXA Exposure to other specified factors, initial encounter: Secondary | ICD-10-CM | POA: Insufficient documentation

## 2017-08-21 DIAGNOSIS — B259 Cytomegaloviral disease, unspecified: Secondary | ICD-10-CM | POA: Insufficient documentation

## 2017-08-21 DIAGNOSIS — Z79899 Other long term (current) drug therapy: Secondary | ICD-10-CM | POA: Diagnosis present

## 2017-08-21 DIAGNOSIS — T8610 Unspecified complication of kidney transplant: Secondary | ICD-10-CM | POA: Insufficient documentation

## 2017-08-21 DIAGNOSIS — E1129 Type 2 diabetes mellitus with other diabetic kidney complication: Secondary | ICD-10-CM | POA: Diagnosis not present

## 2017-08-21 DIAGNOSIS — Z09 Encounter for follow-up examination after completed treatment for conditions other than malignant neoplasm: Secondary | ICD-10-CM | POA: Diagnosis not present

## 2017-08-21 DIAGNOSIS — Z789 Other specified health status: Secondary | ICD-10-CM | POA: Insufficient documentation

## 2017-08-21 DIAGNOSIS — E559 Vitamin D deficiency, unspecified: Secondary | ICD-10-CM | POA: Diagnosis not present

## 2017-08-21 DIAGNOSIS — D899 Disorder involving the immune mechanism, unspecified: Secondary | ICD-10-CM | POA: Diagnosis present

## 2017-08-21 DIAGNOSIS — Z114 Encounter for screening for human immunodeficiency virus [HIV]: Secondary | ICD-10-CM | POA: Insufficient documentation

## 2017-08-21 DIAGNOSIS — Z9483 Pancreas transplant status: Secondary | ICD-10-CM | POA: Insufficient documentation

## 2017-08-21 LAB — BASIC METABOLIC PANEL
ANION GAP: 8 (ref 5–15)
BUN: 32 mg/dL — ABNORMAL HIGH (ref 6–20)
CALCIUM: 9.9 mg/dL (ref 8.9–10.3)
CO2: 26 mmol/L (ref 22–32)
CREATININE: 1.46 mg/dL — AB (ref 0.61–1.24)
Chloride: 102 mmol/L (ref 101–111)
GFR, EST AFRICAN AMERICAN: 54 mL/min — AB (ref >60)
GFR, EST NON AFRICAN AMERICAN: 46 mL/min — AB (ref >60)
GLUCOSE: 132 mg/dL — AB (ref 65–99)
Potassium: 4.9 mmol/L (ref 3.5–5.1)
Sodium: 136 mmol/L (ref 135–145)

## 2017-08-21 LAB — CBC WITH DIFFERENTIAL/PLATELET
BASOS PCT: 1 %
Basophils Absolute: 0 10*3/uL (ref 0–0.1)
EOS ABS: 0 10*3/uL (ref 0–0.7)
EOS PCT: 1 %
HCT: 42.8 % (ref 40.0–52.0)
HEMOGLOBIN: 13.7 g/dL (ref 13.0–18.0)
LYMPHS ABS: 1.2 10*3/uL (ref 1.0–3.6)
Lymphocytes Relative: 29 %
MCH: 26.7 pg (ref 26.0–34.0)
MCHC: 32 g/dL (ref 32.0–36.0)
MCV: 83.5 fL (ref 80.0–100.0)
MONOS PCT: 16 %
Monocytes Absolute: 0.6 10*3/uL (ref 0.2–1.0)
NEUTROS PCT: 55 %
Neutro Abs: 2.3 10*3/uL (ref 1.4–6.5)
PLATELETS: 142 10*3/uL — AB (ref 150–440)
RBC: 5.12 MIL/uL (ref 4.40–5.90)
RDW: 13.9 % (ref 11.5–14.5)
WBC: 4.1 10*3/uL (ref 3.8–10.6)

## 2017-08-21 LAB — PHOSPHORUS: PHOSPHORUS: 2.7 mg/dL (ref 2.5–4.6)

## 2017-08-21 LAB — MAGNESIUM: Magnesium: 1.9 mg/dL (ref 1.7–2.4)

## 2017-09-23 ENCOUNTER — Other Ambulatory Visit
Admission: RE | Admit: 2017-09-23 | Discharge: 2017-09-23 | Disposition: A | Discharge status: Home / Self Care | Payer: Medicare Other | Source: Ambulatory Visit | Attending: Nephrology | Admitting: Nephrology

## 2017-09-23 DIAGNOSIS — D631 Anemia in chronic kidney disease: Secondary | ICD-10-CM | POA: Insufficient documentation

## 2017-09-23 DIAGNOSIS — Z114 Encounter for screening for human immunodeficiency virus [HIV]: Secondary | ICD-10-CM | POA: Diagnosis not present

## 2017-09-23 DIAGNOSIS — N189 Chronic kidney disease, unspecified: Secondary | ICD-10-CM | POA: Diagnosis not present

## 2017-09-23 DIAGNOSIS — Z79899 Other long term (current) drug therapy: Secondary | ICD-10-CM | POA: Insufficient documentation

## 2017-09-23 DIAGNOSIS — Z789 Other specified health status: Secondary | ICD-10-CM | POA: Insufficient documentation

## 2017-09-23 DIAGNOSIS — E1129 Type 2 diabetes mellitus with other diabetic kidney complication: Secondary | ICD-10-CM | POA: Diagnosis not present

## 2017-09-23 DIAGNOSIS — D899 Disorder involving the immune mechanism, unspecified: Secondary | ICD-10-CM | POA: Diagnosis present

## 2017-09-23 DIAGNOSIS — B259 Cytomegaloviral disease, unspecified: Secondary | ICD-10-CM | POA: Diagnosis not present

## 2017-09-23 DIAGNOSIS — E559 Vitamin D deficiency, unspecified: Secondary | ICD-10-CM | POA: Insufficient documentation

## 2017-09-23 DIAGNOSIS — N390 Urinary tract infection, site not specified: Secondary | ICD-10-CM | POA: Diagnosis not present

## 2017-09-23 DIAGNOSIS — Z9483 Pancreas transplant status: Secondary | ICD-10-CM | POA: Insufficient documentation

## 2017-09-23 DIAGNOSIS — Z940 Kidney transplant status: Secondary | ICD-10-CM | POA: Insufficient documentation

## 2017-09-23 LAB — CBC WITH DIFFERENTIAL/PLATELET
BASOS ABS: 0 10*3/uL (ref 0–0.1)
Basophils Relative: 1 %
Eosinophils Absolute: 0 10*3/uL (ref 0–0.7)
Eosinophils Relative: 1 %
HEMATOCRIT: 39.1 % — AB (ref 40.0–52.0)
HEMOGLOBIN: 12.8 g/dL — AB (ref 13.0–18.0)
LYMPHS ABS: 1.2 10*3/uL (ref 1.0–3.6)
Lymphocytes Relative: 20 %
MCH: 27.3 pg (ref 26.0–34.0)
MCHC: 32.9 g/dL (ref 32.0–36.0)
MCV: 83 fL (ref 80.0–100.0)
MONO ABS: 0.9 10*3/uL (ref 0.2–1.0)
MONOS PCT: 14 %
NEUTROS ABS: 4.1 10*3/uL (ref 1.4–6.5)
NEUTROS PCT: 66 %
PLATELETS: 171 10*3/uL (ref 150–440)
RBC: 4.71 MIL/uL (ref 4.40–5.90)
RDW: 13.6 % (ref 11.5–14.5)
WBC: 6.3 10*3/uL (ref 3.8–10.6)

## 2017-09-23 LAB — BASIC METABOLIC PANEL
Anion gap: 10 (ref 5–15)
BUN: 35 mg/dL — AB (ref 6–20)
CHLORIDE: 105 mmol/L (ref 101–111)
CO2: 19 mmol/L — ABNORMAL LOW (ref 22–32)
CREATININE: 1.39 mg/dL — AB (ref 0.61–1.24)
Calcium: 9.6 mg/dL (ref 8.9–10.3)
GFR calc Af Amer: 57 mL/min — ABNORMAL LOW (ref >60)
GFR calc non Af Amer: 49 mL/min — ABNORMAL LOW (ref >60)
GLUCOSE: 160 mg/dL — AB (ref 65–99)
POTASSIUM: 4.6 mmol/L (ref 3.5–5.1)
Sodium: 134 mmol/L — ABNORMAL LOW (ref 135–145)

## 2017-09-23 LAB — PHOSPHORUS: Phosphorus: 2.8 mg/dL (ref 2.5–4.6)

## 2017-09-23 LAB — MAGNESIUM: Magnesium: 1.7 mg/dL (ref 1.7–2.4)

## 2017-11-24 IMAGING — CR DG CHEST 1V PORT
1 series · 2 of 2 positions shown · non-contrast
Comparison: 10/18/2013

CLINICAL DATA: Hypoglycemia.

EXAM:
PORTABLE CHEST 1 VIEW

[Series 1: ap · 0.17mm/px · 2 of 2 slices shown]
[im 1/2]
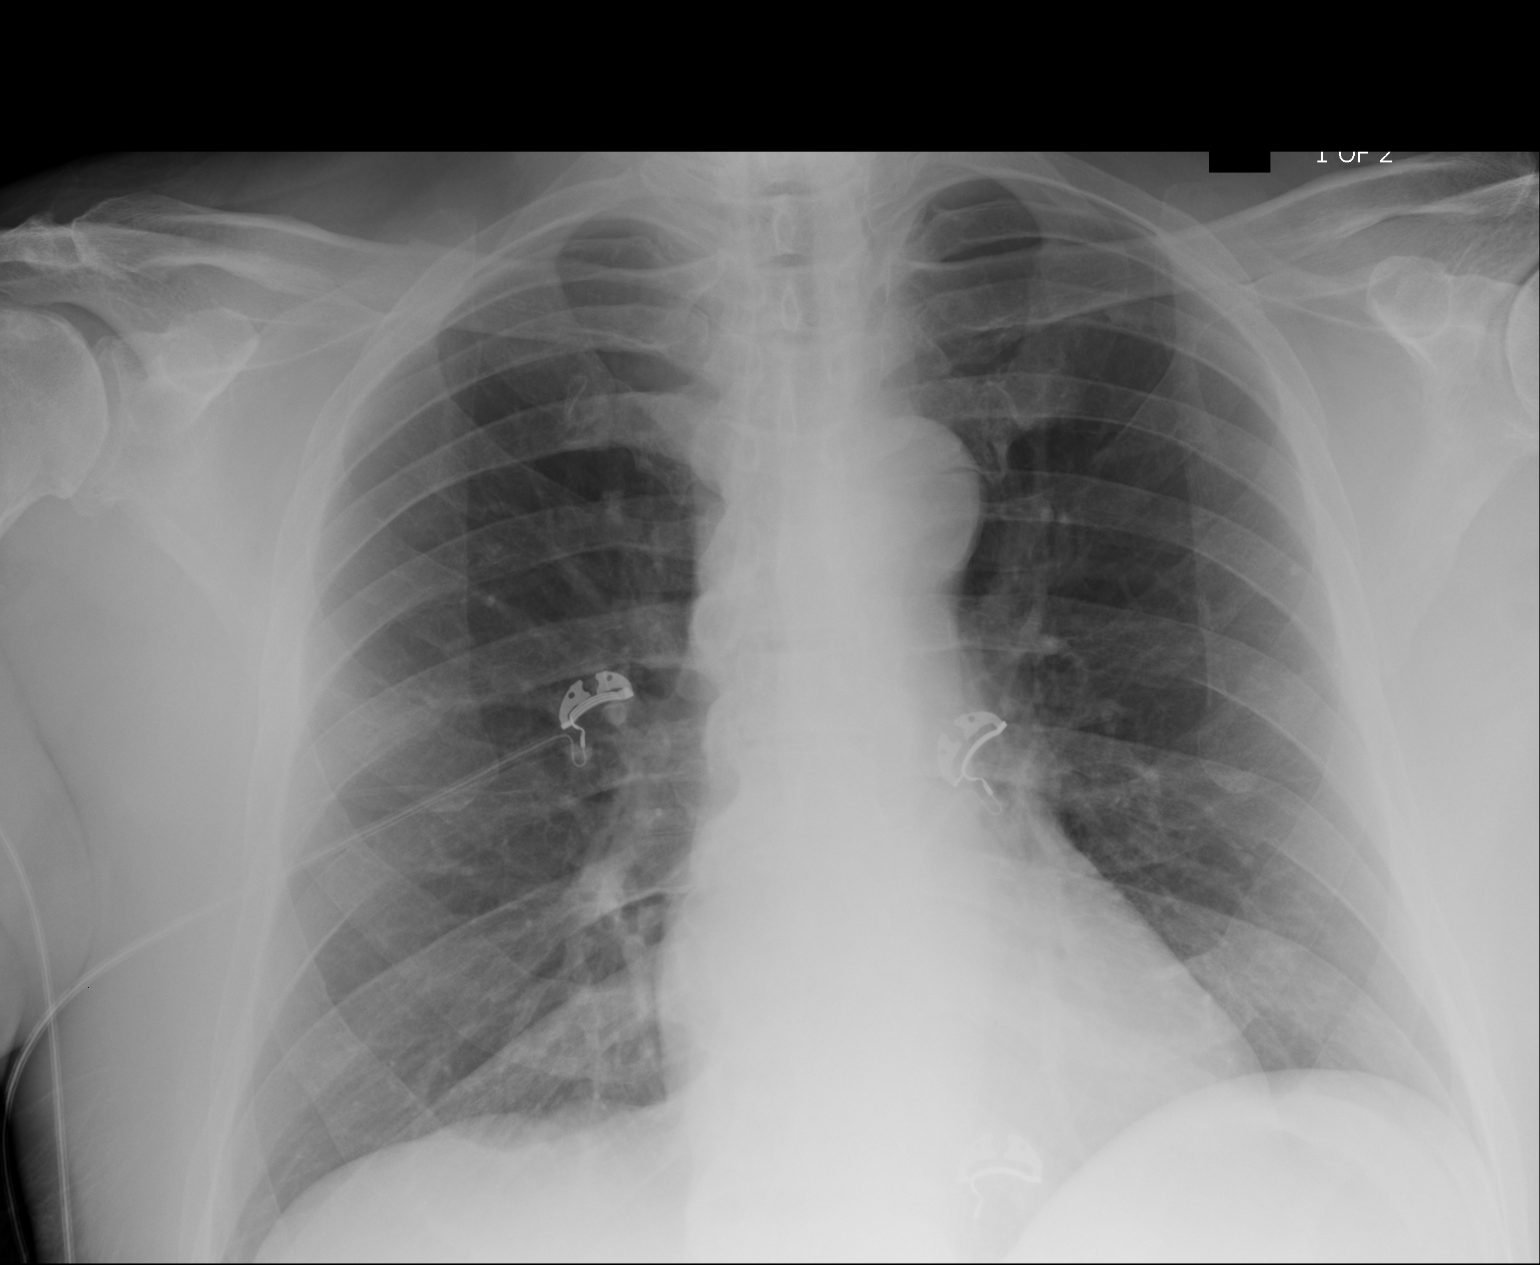
[im 2/2]
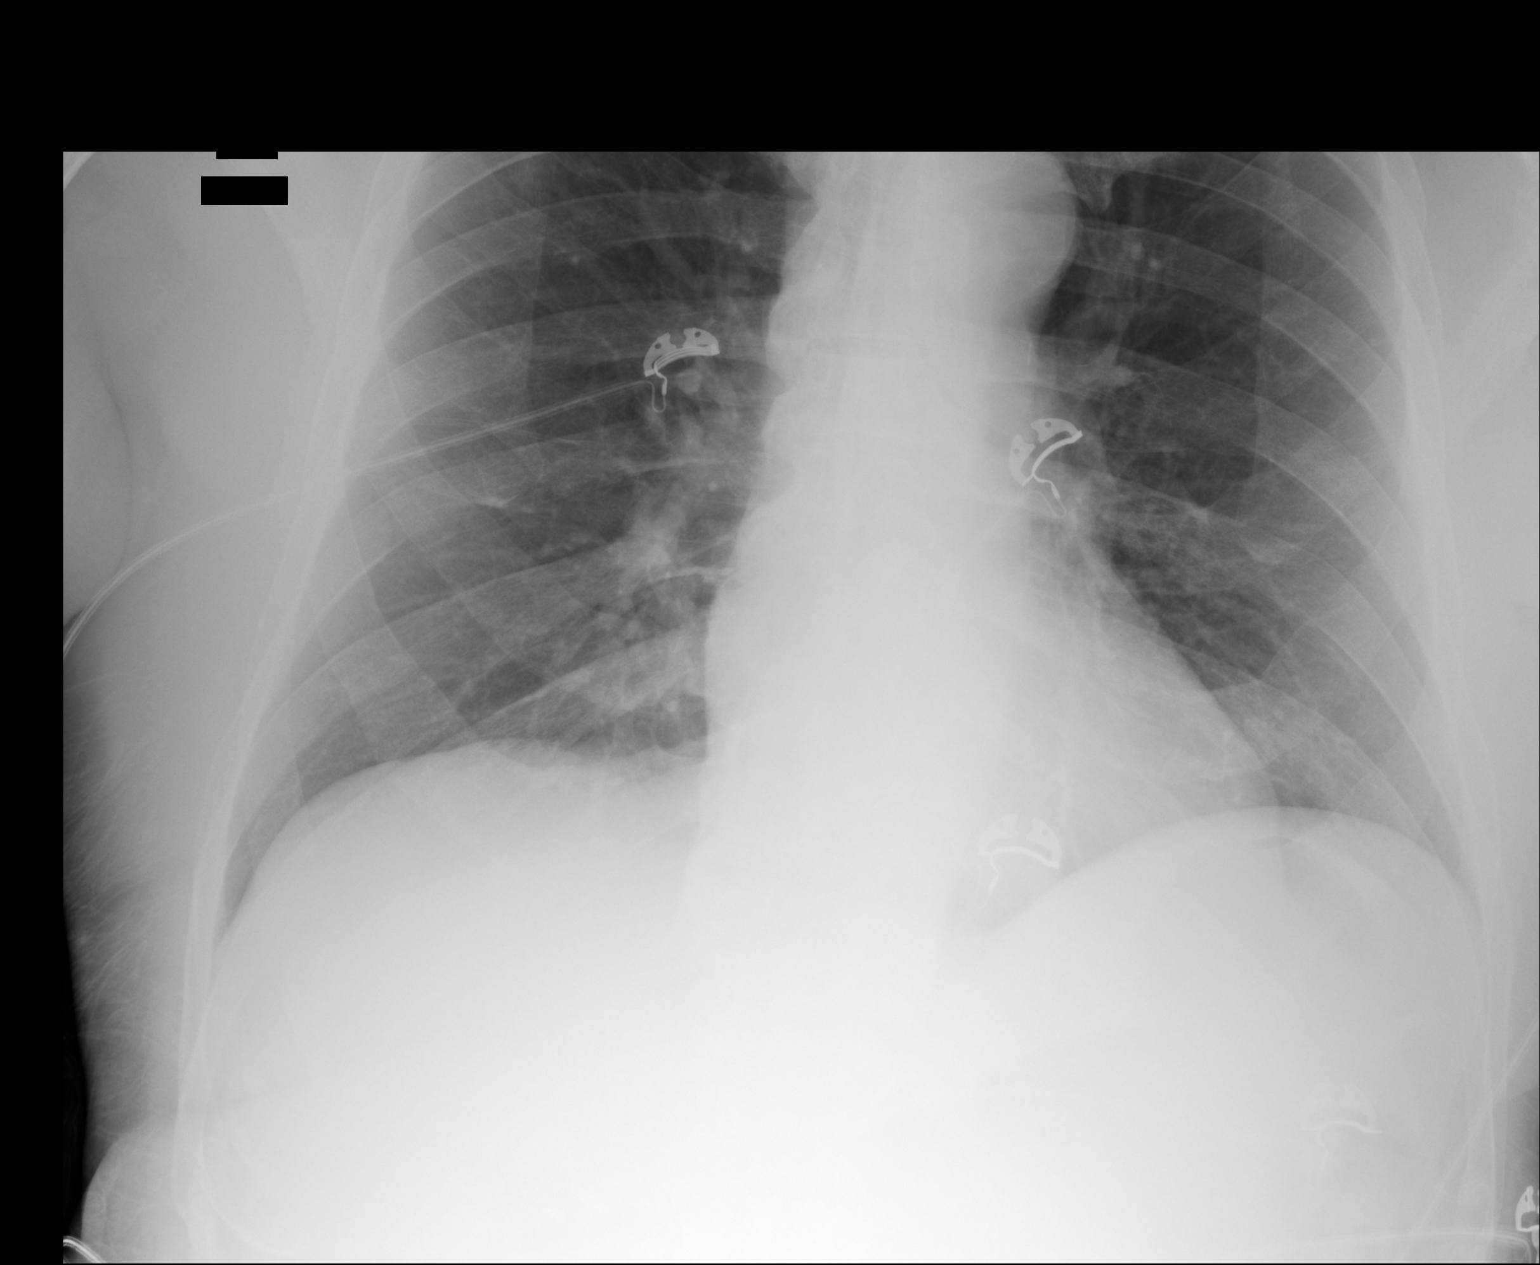

[2 of 2 positions shown; findings below may reference images not displayed]

FINDINGS: The heart size and mediastinal contours are within normal limits.
Both lungs are clear. The visualized skeletal structures are
unremarkable.
IMPRESSION: No active disease.

## 2017-11-26 ENCOUNTER — Other Ambulatory Visit
Admission: RE | Admit: 2017-11-26 | Discharge: 2017-11-26 | Disposition: A | Discharge status: Home / Self Care | Payer: Medicare Other | Source: Ambulatory Visit | Attending: Nephrology | Admitting: Nephrology

## 2017-11-26 DIAGNOSIS — Z9483 Pancreas transplant status: Secondary | ICD-10-CM | POA: Insufficient documentation

## 2017-11-26 DIAGNOSIS — Z940 Kidney transplant status: Secondary | ICD-10-CM | POA: Diagnosis not present

## 2017-11-26 DIAGNOSIS — D631 Anemia in chronic kidney disease: Secondary | ICD-10-CM | POA: Diagnosis not present

## 2017-11-26 DIAGNOSIS — D899 Disorder involving the immune mechanism, unspecified: Secondary | ICD-10-CM | POA: Diagnosis present

## 2017-11-26 DIAGNOSIS — N390 Urinary tract infection, site not specified: Secondary | ICD-10-CM | POA: Diagnosis not present

## 2017-11-26 DIAGNOSIS — E559 Vitamin D deficiency, unspecified: Secondary | ICD-10-CM | POA: Insufficient documentation

## 2017-11-26 DIAGNOSIS — Z79899 Other long term (current) drug therapy: Secondary | ICD-10-CM | POA: Diagnosis not present

## 2017-11-26 DIAGNOSIS — Z789 Other specified health status: Secondary | ICD-10-CM | POA: Diagnosis not present

## 2017-11-26 DIAGNOSIS — B259 Cytomegaloviral disease, unspecified: Secondary | ICD-10-CM | POA: Insufficient documentation

## 2017-11-26 DIAGNOSIS — Z09 Encounter for follow-up examination after completed treatment for conditions other than malignant neoplasm: Secondary | ICD-10-CM | POA: Diagnosis not present

## 2017-11-26 DIAGNOSIS — E1129 Type 2 diabetes mellitus with other diabetic kidney complication: Secondary | ICD-10-CM | POA: Insufficient documentation

## 2017-11-26 DIAGNOSIS — T8610 Unspecified complication of kidney transplant: Secondary | ICD-10-CM | POA: Insufficient documentation

## 2017-11-26 DIAGNOSIS — Z114 Encounter for screening for human immunodeficiency virus [HIV]: Secondary | ICD-10-CM | POA: Insufficient documentation

## 2017-11-26 DIAGNOSIS — X58XXXA Exposure to other specified factors, initial encounter: Secondary | ICD-10-CM | POA: Diagnosis not present

## 2017-11-26 LAB — CBC WITH DIFFERENTIAL/PLATELET
BASOS ABS: 0 10*3/uL (ref 0–0.1)
BASOS PCT: 1 %
Eosinophils Absolute: 0 10*3/uL (ref 0–0.7)
Eosinophils Relative: 0 %
HEMATOCRIT: 40.3 % (ref 40.0–52.0)
HEMOGLOBIN: 12.8 g/dL — AB (ref 13.0–18.0)
Lymphocytes Relative: 17 %
Lymphs Abs: 1 10*3/uL (ref 1.0–3.6)
MCH: 26.2 pg (ref 26.0–34.0)
MCHC: 31.8 g/dL — ABNORMAL LOW (ref 32.0–36.0)
MCV: 82.4 fL (ref 80.0–100.0)
Monocytes Absolute: 0.5 10*3/uL (ref 0.2–1.0)
Monocytes Relative: 9 %
NEUTROS ABS: 4.4 10*3/uL (ref 1.4–6.5)
NEUTROS PCT: 73 %
Platelets: 204 10*3/uL (ref 150–440)
RBC: 4.88 MIL/uL (ref 4.40–5.90)
RDW: 14.3 % (ref 11.5–14.5)
WBC: 6 10*3/uL (ref 3.8–10.6)

## 2017-11-26 LAB — BASIC METABOLIC PANEL
Anion gap: 9 (ref 5–15)
BUN: 25 mg/dL — AB (ref 6–20)
CO2: 23 mmol/L (ref 22–32)
Calcium: 9.7 mg/dL (ref 8.9–10.3)
Chloride: 103 mmol/L (ref 101–111)
Creatinine, Ser: 1.04 mg/dL (ref 0.61–1.24)
GFR calc non Af Amer: >60 mL/min (ref >60)
Glucose, Bld: 141 mg/dL — ABNORMAL HIGH (ref 65–99)
POTASSIUM: 4 mmol/L (ref 3.5–5.1)
SODIUM: 135 mmol/L (ref 135–145)

## 2017-11-26 LAB — MAGNESIUM: MAGNESIUM: 1.7 mg/dL (ref 1.7–2.4)

## 2017-11-26 LAB — PHOSPHORUS: PHOSPHORUS: 2.7 mg/dL (ref 2.5–4.6)

## 2017-12-03 ENCOUNTER — Other Ambulatory Visit
Admission: RE | Admit: 2017-12-03 | Discharge: 2017-12-03 | Disposition: A | Discharge status: Home / Self Care | Payer: Medicare Other | Source: Ambulatory Visit | Attending: Nephrology | Admitting: Nephrology

## 2017-12-03 DIAGNOSIS — X58XXXA Exposure to other specified factors, initial encounter: Secondary | ICD-10-CM | POA: Insufficient documentation

## 2017-12-03 DIAGNOSIS — Z114 Encounter for screening for human immunodeficiency virus [HIV]: Secondary | ICD-10-CM | POA: Insufficient documentation

## 2017-12-03 DIAGNOSIS — Z9483 Pancreas transplant status: Secondary | ICD-10-CM | POA: Insufficient documentation

## 2017-12-03 DIAGNOSIS — Z79899 Other long term (current) drug therapy: Secondary | ICD-10-CM | POA: Diagnosis present

## 2017-12-03 DIAGNOSIS — D899 Disorder involving the immune mechanism, unspecified: Secondary | ICD-10-CM | POA: Diagnosis present

## 2017-12-03 DIAGNOSIS — E1129 Type 2 diabetes mellitus with other diabetic kidney complication: Secondary | ICD-10-CM | POA: Diagnosis not present

## 2017-12-03 DIAGNOSIS — Z09 Encounter for follow-up examination after completed treatment for conditions other than malignant neoplasm: Secondary | ICD-10-CM | POA: Insufficient documentation

## 2017-12-03 DIAGNOSIS — N189 Chronic kidney disease, unspecified: Secondary | ICD-10-CM | POA: Insufficient documentation

## 2017-12-03 DIAGNOSIS — B259 Cytomegaloviral disease, unspecified: Secondary | ICD-10-CM | POA: Diagnosis not present

## 2017-12-03 DIAGNOSIS — D631 Anemia in chronic kidney disease: Secondary | ICD-10-CM | POA: Diagnosis not present

## 2017-12-03 DIAGNOSIS — Z789 Other specified health status: Secondary | ICD-10-CM | POA: Diagnosis not present

## 2017-12-03 DIAGNOSIS — N390 Urinary tract infection, site not specified: Secondary | ICD-10-CM | POA: Insufficient documentation

## 2017-12-03 DIAGNOSIS — Z940 Kidney transplant status: Secondary | ICD-10-CM | POA: Insufficient documentation

## 2017-12-03 DIAGNOSIS — T8610 Unspecified complication of kidney transplant: Secondary | ICD-10-CM | POA: Insufficient documentation

## 2017-12-03 DIAGNOSIS — E559 Vitamin D deficiency, unspecified: Secondary | ICD-10-CM | POA: Insufficient documentation

## 2017-12-03 LAB — BASIC METABOLIC PANEL
Anion gap: 9 (ref 5–15)
BUN: 31 mg/dL — ABNORMAL HIGH (ref 6–20)
CALCIUM: 9.9 mg/dL (ref 8.9–10.3)
CO2: 24 mmol/L (ref 22–32)
CREATININE: 1.41 mg/dL — AB (ref 0.61–1.24)
Chloride: 103 mmol/L (ref 101–111)
GFR calc Af Amer: 56 mL/min — ABNORMAL LOW (ref >60)
GFR, EST NON AFRICAN AMERICAN: 48 mL/min — AB (ref >60)
Glucose, Bld: 154 mg/dL — ABNORMAL HIGH (ref 65–99)
POTASSIUM: 4.8 mmol/L (ref 3.5–5.1)
SODIUM: 136 mmol/L (ref 135–145)

## 2017-12-03 LAB — CBC WITH DIFFERENTIAL/PLATELET
BASOS PCT: 0 %
Basophils Absolute: 0 10*3/uL (ref 0–0.1)
EOS ABS: 0 10*3/uL (ref 0–0.7)
EOS PCT: 1 %
HCT: 37.2 % — ABNORMAL LOW (ref 40.0–52.0)
Hemoglobin: 12 g/dL — ABNORMAL LOW (ref 13.0–18.0)
Lymphocytes Relative: 7 %
Lymphs Abs: 0.5 10*3/uL — ABNORMAL LOW (ref 1.0–3.6)
MCH: 26.8 pg (ref 26.0–34.0)
MCHC: 32.3 g/dL (ref 32.0–36.0)
MCV: 82.9 fL (ref 80.0–100.0)
MONO ABS: 0.9 10*3/uL (ref 0.2–1.0)
Monocytes Relative: 14 %
Neutro Abs: 5.2 10*3/uL (ref 1.4–6.5)
Neutrophils Relative %: 78 %
Platelets: 200 10*3/uL (ref 150–440)
RBC: 4.49 MIL/uL (ref 4.40–5.90)
RDW: 14.7 % — AB (ref 11.5–14.5)
WBC: 6.6 10*3/uL (ref 3.8–10.6)

## 2017-12-03 LAB — MAGNESIUM: Magnesium: 1.8 mg/dL (ref 1.7–2.4)

## 2017-12-03 LAB — PHOSPHORUS: PHOSPHORUS: 2.6 mg/dL (ref 2.5–4.6)

## 2017-12-11 ENCOUNTER — Other Ambulatory Visit
Admission: RE | Admit: 2017-12-11 | Discharge: 2017-12-11 | Disposition: A | Discharge status: Home / Self Care | Payer: Medicare Other | Source: Ambulatory Visit | Attending: Nephrology | Admitting: Nephrology

## 2017-12-11 DIAGNOSIS — Z789 Other specified health status: Secondary | ICD-10-CM | POA: Diagnosis not present

## 2017-12-11 DIAGNOSIS — B259 Cytomegaloviral disease, unspecified: Secondary | ICD-10-CM | POA: Diagnosis not present

## 2017-12-11 DIAGNOSIS — E559 Vitamin D deficiency, unspecified: Secondary | ICD-10-CM | POA: Insufficient documentation

## 2017-12-11 DIAGNOSIS — Z9483 Pancreas transplant status: Secondary | ICD-10-CM | POA: Insufficient documentation

## 2017-12-11 DIAGNOSIS — N390 Urinary tract infection, site not specified: Secondary | ICD-10-CM | POA: Diagnosis not present

## 2017-12-11 DIAGNOSIS — Z114 Encounter for screening for human immunodeficiency virus [HIV]: Secondary | ICD-10-CM | POA: Diagnosis not present

## 2017-12-11 DIAGNOSIS — E1129 Type 2 diabetes mellitus with other diabetic kidney complication: Secondary | ICD-10-CM | POA: Diagnosis not present

## 2017-12-11 DIAGNOSIS — D631 Anemia in chronic kidney disease: Secondary | ICD-10-CM | POA: Insufficient documentation

## 2017-12-11 DIAGNOSIS — E1122 Type 2 diabetes mellitus with diabetic chronic kidney disease: Secondary | ICD-10-CM | POA: Insufficient documentation

## 2017-12-11 DIAGNOSIS — N189 Chronic kidney disease, unspecified: Secondary | ICD-10-CM | POA: Insufficient documentation

## 2017-12-11 DIAGNOSIS — Z09 Encounter for follow-up examination after completed treatment for conditions other than malignant neoplasm: Secondary | ICD-10-CM | POA: Insufficient documentation

## 2017-12-11 DIAGNOSIS — Z940 Kidney transplant status: Secondary | ICD-10-CM | POA: Insufficient documentation

## 2017-12-11 DIAGNOSIS — Z79899 Other long term (current) drug therapy: Secondary | ICD-10-CM | POA: Diagnosis not present

## 2017-12-11 DIAGNOSIS — D899 Disorder involving the immune mechanism, unspecified: Secondary | ICD-10-CM | POA: Diagnosis present

## 2017-12-11 LAB — CBC WITH DIFFERENTIAL/PLATELET
Basophils Absolute: 0 10*3/uL (ref 0–0.1)
Basophils Relative: 0 %
EOS ABS: 0 10*3/uL (ref 0–0.7)
EOS PCT: 0 %
HCT: 41.2 % (ref 40.0–52.0)
Hemoglobin: 13.4 g/dL (ref 13.0–18.0)
LYMPHS ABS: 1.1 10*3/uL (ref 1.0–3.6)
LYMPHS PCT: 23 %
MCH: 26.6 pg (ref 26.0–34.0)
MCHC: 32.5 g/dL (ref 32.0–36.0)
MCV: 81.8 fL (ref 80.0–100.0)
MONO ABS: 0.6 10*3/uL (ref 0.2–1.0)
MONOS PCT: 12 %
Neutro Abs: 3 10*3/uL (ref 1.4–6.5)
Neutrophils Relative %: 65 %
PLATELETS: 222 10*3/uL (ref 150–440)
RBC: 5.03 MIL/uL (ref 4.40–5.90)
RDW: 14.5 % (ref 11.5–14.5)
WBC: 4.8 10*3/uL (ref 3.8–10.6)

## 2017-12-11 LAB — BASIC METABOLIC PANEL
ANION GAP: 11 (ref 5–15)
BUN: 24 mg/dL — AB (ref 6–20)
CALCIUM: 10.1 mg/dL (ref 8.9–10.3)
CO2: 22 mmol/L (ref 22–32)
CREATININE: 1.36 mg/dL — AB (ref 0.61–1.24)
Chloride: 103 mmol/L (ref 101–111)
GFR calc Af Amer: 58 mL/min — ABNORMAL LOW (ref >60)
GFR, EST NON AFRICAN AMERICAN: 50 mL/min — AB (ref >60)
GLUCOSE: 131 mg/dL — AB (ref 65–99)
Potassium: 4.6 mmol/L (ref 3.5–5.1)
Sodium: 136 mmol/L (ref 135–145)

## 2017-12-11 LAB — MAGNESIUM: Magnesium: 1.9 mg/dL (ref 1.7–2.4)

## 2017-12-11 LAB — PHOSPHORUS: Phosphorus: 2.6 mg/dL (ref 2.5–4.6)

## 2017-12-17 ENCOUNTER — Other Ambulatory Visit
Admission: RE | Admit: 2017-12-17 | Discharge: 2017-12-17 | Disposition: A | Discharge status: Home / Self Care | Payer: Medicare Other | Source: Ambulatory Visit | Attending: Nephrology | Admitting: Nephrology

## 2017-12-17 DIAGNOSIS — D899 Disorder involving the immune mechanism, unspecified: Secondary | ICD-10-CM | POA: Insufficient documentation

## 2017-12-17 DIAGNOSIS — E559 Vitamin D deficiency, unspecified: Secondary | ICD-10-CM | POA: Diagnosis not present

## 2017-12-17 DIAGNOSIS — N390 Urinary tract infection, site not specified: Secondary | ICD-10-CM | POA: Insufficient documentation

## 2017-12-17 DIAGNOSIS — Z789 Other specified health status: Secondary | ICD-10-CM | POA: Diagnosis not present

## 2017-12-17 DIAGNOSIS — Z794 Long term (current) use of insulin: Secondary | ICD-10-CM | POA: Diagnosis not present

## 2017-12-17 DIAGNOSIS — T8610 Unspecified complication of kidney transplant: Secondary | ICD-10-CM | POA: Insufficient documentation

## 2017-12-17 DIAGNOSIS — D631 Anemia in chronic kidney disease: Secondary | ICD-10-CM | POA: Insufficient documentation

## 2017-12-17 DIAGNOSIS — Z114 Encounter for screening for human immunodeficiency virus [HIV]: Secondary | ICD-10-CM | POA: Diagnosis not present

## 2017-12-17 DIAGNOSIS — Z79899 Other long term (current) drug therapy: Secondary | ICD-10-CM | POA: Diagnosis not present

## 2017-12-17 DIAGNOSIS — B259 Cytomegaloviral disease, unspecified: Secondary | ICD-10-CM | POA: Diagnosis not present

## 2017-12-17 DIAGNOSIS — Z09 Encounter for follow-up examination after completed treatment for conditions other than malignant neoplasm: Secondary | ICD-10-CM | POA: Diagnosis not present

## 2017-12-17 DIAGNOSIS — E1129 Type 2 diabetes mellitus with other diabetic kidney complication: Secondary | ICD-10-CM | POA: Diagnosis not present

## 2017-12-17 DIAGNOSIS — Z9483 Pancreas transplant status: Secondary | ICD-10-CM | POA: Insufficient documentation

## 2017-12-17 LAB — BASIC METABOLIC PANEL
Anion gap: 8 (ref 5–15)
BUN: 28 mg/dL — ABNORMAL HIGH (ref 6–20)
CALCIUM: 9.6 mg/dL (ref 8.9–10.3)
CHLORIDE: 101 mmol/L (ref 101–111)
CO2: 26 mmol/L (ref 22–32)
CREATININE: 1.47 mg/dL — AB (ref 0.61–1.24)
GFR calc non Af Amer: 46 mL/min — ABNORMAL LOW (ref >60)
GFR, EST AFRICAN AMERICAN: 53 mL/min — AB (ref >60)
Glucose, Bld: 140 mg/dL — ABNORMAL HIGH (ref 65–99)
Potassium: 4.7 mmol/L (ref 3.5–5.1)
SODIUM: 135 mmol/L (ref 135–145)

## 2017-12-17 LAB — CBC WITH DIFFERENTIAL/PLATELET
BASOS ABS: 0 10*3/uL (ref 0–0.1)
BASOS PCT: 1 %
Eosinophils Absolute: 0 10*3/uL (ref 0–0.7)
Eosinophils Relative: 1 %
HCT: 39 % — ABNORMAL LOW (ref 40.0–52.0)
Hemoglobin: 12.5 g/dL — ABNORMAL LOW (ref 13.0–18.0)
Lymphocytes Relative: 22 %
Lymphs Abs: 1.1 10*3/uL (ref 1.0–3.6)
MCH: 26.3 pg (ref 26.0–34.0)
MCHC: 32.1 g/dL (ref 32.0–36.0)
MCV: 81.9 fL (ref 80.0–100.0)
MONO ABS: 0.8 10*3/uL (ref 0.2–1.0)
Monocytes Relative: 16 %
Neutro Abs: 3.1 10*3/uL (ref 1.4–6.5)
Neutrophils Relative %: 60 %
Platelets: 204 10*3/uL (ref 150–440)
RBC: 4.76 MIL/uL (ref 4.40–5.90)
RDW: 15 % — AB (ref 11.5–14.5)
WBC: 5.2 10*3/uL (ref 3.8–10.6)

## 2017-12-17 LAB — MAGNESIUM: MAGNESIUM: 1.9 mg/dL (ref 1.7–2.4)

## 2017-12-17 LAB — PHOSPHORUS: PHOSPHORUS: 3 mg/dL (ref 2.5–4.6)

## 2018-01-22 ENCOUNTER — Other Ambulatory Visit
Admission: RE | Admit: 2018-01-22 | Discharge: 2018-01-22 | Disposition: A | Discharge status: Home / Self Care | Payer: Medicare Other | Source: Ambulatory Visit | Attending: Nephrology | Admitting: Nephrology

## 2018-01-22 DIAGNOSIS — Z789 Other specified health status: Secondary | ICD-10-CM | POA: Insufficient documentation

## 2018-01-22 DIAGNOSIS — N390 Urinary tract infection, site not specified: Secondary | ICD-10-CM | POA: Diagnosis not present

## 2018-01-22 DIAGNOSIS — T8610 Unspecified complication of kidney transplant: Secondary | ICD-10-CM | POA: Diagnosis not present

## 2018-01-22 DIAGNOSIS — Z9483 Pancreas transplant status: Secondary | ICD-10-CM | POA: Insufficient documentation

## 2018-01-22 DIAGNOSIS — Z114 Encounter for screening for human immunodeficiency virus [HIV]: Secondary | ICD-10-CM | POA: Insufficient documentation

## 2018-01-22 DIAGNOSIS — Z940 Kidney transplant status: Secondary | ICD-10-CM | POA: Insufficient documentation

## 2018-01-22 DIAGNOSIS — B259 Cytomegaloviral disease, unspecified: Secondary | ICD-10-CM | POA: Diagnosis not present

## 2018-01-22 DIAGNOSIS — Z79899 Other long term (current) drug therapy: Secondary | ICD-10-CM | POA: Diagnosis not present

## 2018-01-22 DIAGNOSIS — Z09 Encounter for follow-up examination after completed treatment for conditions other than malignant neoplasm: Secondary | ICD-10-CM | POA: Diagnosis not present

## 2018-01-22 DIAGNOSIS — D899 Disorder involving the immune mechanism, unspecified: Secondary | ICD-10-CM | POA: Insufficient documentation

## 2018-01-22 DIAGNOSIS — D631 Anemia in chronic kidney disease: Secondary | ICD-10-CM | POA: Diagnosis not present

## 2018-01-22 LAB — BASIC METABOLIC PANEL
ANION GAP: 9 (ref 5–15)
BUN: 31 mg/dL — AB (ref 6–20)
CALCIUM: 9.6 mg/dL (ref 8.9–10.3)
CO2: 24 mmol/L (ref 22–32)
Chloride: 102 mmol/L (ref 101–111)
Creatinine, Ser: 1.45 mg/dL — ABNORMAL HIGH (ref 0.61–1.24)
GFR calc Af Amer: 54 mL/min — ABNORMAL LOW (ref >60)
GFR, EST NON AFRICAN AMERICAN: 47 mL/min — AB (ref >60)
GLUCOSE: 149 mg/dL — AB (ref 65–99)
POTASSIUM: 4.8 mmol/L (ref 3.5–5.1)
SODIUM: 135 mmol/L (ref 135–145)

## 2018-01-22 LAB — LIPID PANEL
CHOL/HDL RATIO: 1.8 ratio
Cholesterol: 105 mg/dL (ref 0–200)
HDL: 58 mg/dL (ref >40)
LDL Cholesterol: 36 mg/dL (ref 0–99)
TRIGLYCERIDES: 56 mg/dL (ref <150)
VLDL: 11 mg/dL (ref 0–40)

## 2018-01-22 LAB — GAMMA GT: GGT: 29 U/L (ref 7–50)

## 2018-01-22 LAB — ALKALINE PHOSPHATASE: Alkaline Phosphatase: 56 U/L (ref 38–126)

## 2018-01-22 LAB — PHOSPHORUS: PHOSPHORUS: 2.8 mg/dL (ref 2.5–4.6)

## 2018-01-22 LAB — CBC WITH DIFFERENTIAL/PLATELET
BASOS PCT: 1 %
Basophils Absolute: 0 10*3/uL (ref 0–0.1)
EOS ABS: 0 10*3/uL (ref 0–0.7)
EOS PCT: 0 %
HCT: 39.5 % — ABNORMAL LOW (ref 40.0–52.0)
Hemoglobin: 12.6 g/dL — ABNORMAL LOW (ref 13.0–18.0)
LYMPHS ABS: 1.1 10*3/uL (ref 1.0–3.6)
Lymphocytes Relative: 24 %
MCH: 26.4 pg (ref 26.0–34.0)
MCHC: 32 g/dL (ref 32.0–36.0)
MCV: 82.5 fL (ref 80.0–100.0)
MONOS PCT: 15 %
Monocytes Absolute: 0.7 10*3/uL (ref 0.2–1.0)
Neutro Abs: 2.7 10*3/uL (ref 1.4–6.5)
Neutrophils Relative %: 60 %
PLATELETS: 173 10*3/uL (ref 150–440)
RBC: 4.79 MIL/uL (ref 4.40–5.90)
RDW: 15.2 % — ABNORMAL HIGH (ref 11.5–14.5)
WBC: 4.5 10*3/uL (ref 3.8–10.6)

## 2018-01-22 LAB — ALBUMIN: Albumin: 4 g/dL (ref 3.5–5.0)

## 2018-01-22 LAB — MAGNESIUM: Magnesium: 1.9 mg/dL (ref 1.7–2.4)

## 2018-01-22 LAB — ALT: ALT: 15 U/L — AB (ref 17–63)

## 2018-01-22 LAB — AST: AST: 18 U/L (ref 15–41)

## 2018-01-22 LAB — BILIRUBIN, TOTAL: Total Bilirubin: 0.8 mg/dL (ref 0.3–1.2)

## 2018-02-17 ENCOUNTER — Other Ambulatory Visit
Admission: RE | Admit: 2018-02-17 | Discharge: 2018-02-17 | Disposition: A | Discharge status: Home / Self Care | Payer: Medicare Other | Source: Ambulatory Visit | Attending: Urology | Admitting: Urology

## 2018-02-17 DIAGNOSIS — E1129 Type 2 diabetes mellitus with other diabetic kidney complication: Secondary | ICD-10-CM | POA: Diagnosis not present

## 2018-02-17 DIAGNOSIS — Z9483 Pancreas transplant status: Secondary | ICD-10-CM | POA: Diagnosis not present

## 2018-02-17 DIAGNOSIS — Z09 Encounter for follow-up examination after completed treatment for conditions other than malignant neoplasm: Secondary | ICD-10-CM | POA: Insufficient documentation

## 2018-02-17 DIAGNOSIS — E559 Vitamin D deficiency, unspecified: Secondary | ICD-10-CM | POA: Diagnosis not present

## 2018-02-17 DIAGNOSIS — Z114 Encounter for screening for human immunodeficiency virus [HIV]: Secondary | ICD-10-CM | POA: Diagnosis not present

## 2018-02-17 DIAGNOSIS — E1122 Type 2 diabetes mellitus with diabetic chronic kidney disease: Secondary | ICD-10-CM | POA: Insufficient documentation

## 2018-02-17 DIAGNOSIS — D899 Disorder involving the immune mechanism, unspecified: Secondary | ICD-10-CM | POA: Diagnosis present

## 2018-02-17 DIAGNOSIS — Z789 Other specified health status: Secondary | ICD-10-CM | POA: Insufficient documentation

## 2018-02-17 DIAGNOSIS — N390 Urinary tract infection, site not specified: Secondary | ICD-10-CM | POA: Diagnosis not present

## 2018-02-17 DIAGNOSIS — Z79899 Other long term (current) drug therapy: Secondary | ICD-10-CM | POA: Insufficient documentation

## 2018-02-17 DIAGNOSIS — N189 Chronic kidney disease, unspecified: Secondary | ICD-10-CM | POA: Diagnosis not present

## 2018-02-17 DIAGNOSIS — B259 Cytomegaloviral disease, unspecified: Secondary | ICD-10-CM | POA: Insufficient documentation

## 2018-02-17 DIAGNOSIS — Z940 Kidney transplant status: Secondary | ICD-10-CM | POA: Insufficient documentation

## 2018-02-17 DIAGNOSIS — D631 Anemia in chronic kidney disease: Secondary | ICD-10-CM | POA: Diagnosis not present

## 2018-02-17 LAB — CBC WITH DIFFERENTIAL/PLATELET
BASOS PCT: 1 %
Basophils Absolute: 0 10*3/uL (ref 0–0.1)
EOS ABS: 0 10*3/uL (ref 0–0.7)
Eosinophils Relative: 0 %
HCT: 39.4 % — ABNORMAL LOW (ref 40.0–52.0)
HEMOGLOBIN: 13 g/dL (ref 13.0–18.0)
Lymphocytes Relative: 22 %
Lymphs Abs: 1 10*3/uL (ref 1.0–3.6)
MCH: 27.2 pg (ref 26.0–34.0)
MCHC: 32.9 g/dL (ref 32.0–36.0)
MCV: 82.5 fL (ref 80.0–100.0)
Monocytes Absolute: 0.6 10*3/uL (ref 0.2–1.0)
Monocytes Relative: 13 %
NEUTROS PCT: 64 %
Neutro Abs: 2.8 10*3/uL (ref 1.4–6.5)
Platelets: 191 10*3/uL (ref 150–440)
RBC: 4.77 MIL/uL (ref 4.40–5.90)
RDW: 15.3 % — ABNORMAL HIGH (ref 11.5–14.5)
WBC: 4.4 10*3/uL (ref 3.8–10.6)

## 2018-02-17 LAB — BASIC METABOLIC PANEL
ANION GAP: 5 (ref 5–15)
BUN: 29 mg/dL — ABNORMAL HIGH (ref 6–20)
CO2: 27 mmol/L (ref 22–32)
Calcium: 9.7 mg/dL (ref 8.9–10.3)
Chloride: 103 mmol/L (ref 101–111)
Creatinine, Ser: 1.4 mg/dL — ABNORMAL HIGH (ref 0.61–1.24)
GFR, EST AFRICAN AMERICAN: 56 mL/min — AB (ref >60)
GFR, EST NON AFRICAN AMERICAN: 49 mL/min — AB (ref >60)
Glucose, Bld: 165 mg/dL — ABNORMAL HIGH (ref 65–99)
POTASSIUM: 4.7 mmol/L (ref 3.5–5.1)
Sodium: 135 mmol/L (ref 135–145)

## 2018-02-17 LAB — MAGNESIUM: MAGNESIUM: 1.9 mg/dL (ref 1.7–2.4)

## 2018-02-17 LAB — PHOSPHORUS: PHOSPHORUS: 3 mg/dL (ref 2.5–4.6)

## 2018-03-10 ENCOUNTER — Other Ambulatory Visit
Admission: RE | Admit: 2018-03-10 | Discharge: 2018-03-10 | Disposition: A | Discharge status: Home / Self Care | Payer: Medicare Other | Source: Ambulatory Visit | Attending: Nephrology | Admitting: Nephrology

## 2018-03-10 DIAGNOSIS — B259 Cytomegaloviral disease, unspecified: Secondary | ICD-10-CM | POA: Insufficient documentation

## 2018-03-10 DIAGNOSIS — D631 Anemia in chronic kidney disease: Secondary | ICD-10-CM | POA: Insufficient documentation

## 2018-03-10 DIAGNOSIS — Z79899 Other long term (current) drug therapy: Secondary | ICD-10-CM | POA: Diagnosis not present

## 2018-03-10 DIAGNOSIS — D899 Disorder involving the immune mechanism, unspecified: Secondary | ICD-10-CM | POA: Insufficient documentation

## 2018-03-10 DIAGNOSIS — Z09 Encounter for follow-up examination after completed treatment for conditions other than malignant neoplasm: Secondary | ICD-10-CM | POA: Diagnosis not present

## 2018-03-10 DIAGNOSIS — E1129 Type 2 diabetes mellitus with other diabetic kidney complication: Secondary | ICD-10-CM | POA: Diagnosis not present

## 2018-03-10 DIAGNOSIS — Z9483 Pancreas transplant status: Secondary | ICD-10-CM | POA: Insufficient documentation

## 2018-03-10 DIAGNOSIS — Z114 Encounter for screening for human immunodeficiency virus [HIV]: Secondary | ICD-10-CM | POA: Insufficient documentation

## 2018-03-10 DIAGNOSIS — N390 Urinary tract infection, site not specified: Secondary | ICD-10-CM | POA: Insufficient documentation

## 2018-03-10 DIAGNOSIS — Z940 Kidney transplant status: Secondary | ICD-10-CM | POA: Insufficient documentation

## 2018-03-10 DIAGNOSIS — Z789 Other specified health status: Secondary | ICD-10-CM | POA: Insufficient documentation

## 2018-03-10 LAB — CBC WITH DIFFERENTIAL/PLATELET
BASOS ABS: 0 10*3/uL (ref 0–0.1)
BASOS PCT: 1 %
Eosinophils Absolute: 0 10*3/uL (ref 0–0.7)
Eosinophils Relative: 1 %
HEMATOCRIT: 38.2 % — AB (ref 40.0–52.0)
HEMOGLOBIN: 12.4 g/dL — AB (ref 13.0–18.0)
Lymphocytes Relative: 24 %
Lymphs Abs: 1.2 10*3/uL (ref 1.0–3.6)
MCH: 26.9 pg (ref 26.0–34.0)
MCHC: 32.4 g/dL (ref 32.0–36.0)
MCV: 83.1 fL (ref 80.0–100.0)
MONOS PCT: 16 %
Monocytes Absolute: 0.8 10*3/uL (ref 0.2–1.0)
NEUTROS ABS: 3 10*3/uL (ref 1.4–6.5)
NEUTROS PCT: 58 %
Platelets: 175 10*3/uL (ref 150–440)
RBC: 4.6 MIL/uL (ref 4.40–5.90)
RDW: 14.8 % — ABNORMAL HIGH (ref 11.5–14.5)
WBC: 5 10*3/uL (ref 3.8–10.6)

## 2018-03-10 LAB — BASIC METABOLIC PANEL
Anion gap: 7 (ref 5–15)
BUN: 37 mg/dL — AB (ref 6–20)
CHLORIDE: 103 mmol/L (ref 101–111)
CO2: 25 mmol/L (ref 22–32)
Calcium: 9.9 mg/dL (ref 8.9–10.3)
Creatinine, Ser: 1.53 mg/dL — ABNORMAL HIGH (ref 0.61–1.24)
GFR calc Af Amer: 51 mL/min — ABNORMAL LOW (ref >60)
GFR calc non Af Amer: 44 mL/min — ABNORMAL LOW (ref >60)
Glucose, Bld: 177 mg/dL — ABNORMAL HIGH (ref 65–99)
POTASSIUM: 4.2 mmol/L (ref 3.5–5.1)
SODIUM: 135 mmol/L (ref 135–145)

## 2018-03-10 LAB — MAGNESIUM: Magnesium: 1.7 mg/dL (ref 1.7–2.4)

## 2018-03-10 LAB — PHOSPHORUS: PHOSPHORUS: 3.4 mg/dL (ref 2.5–4.6)

## 2018-04-07 ENCOUNTER — Other Ambulatory Visit
Admission: RE | Admit: 2018-04-07 | Discharge: 2018-04-07 | Disposition: A | Discharge status: Home / Self Care | Payer: Medicare Other | Source: Ambulatory Visit | Attending: Nephrology | Admitting: Nephrology

## 2018-04-07 DIAGNOSIS — Z79899 Other long term (current) drug therapy: Secondary | ICD-10-CM | POA: Diagnosis not present

## 2018-04-07 DIAGNOSIS — E559 Vitamin D deficiency, unspecified: Secondary | ICD-10-CM | POA: Insufficient documentation

## 2018-04-07 DIAGNOSIS — Z940 Kidney transplant status: Secondary | ICD-10-CM | POA: Insufficient documentation

## 2018-04-07 DIAGNOSIS — N390 Urinary tract infection, site not specified: Secondary | ICD-10-CM | POA: Diagnosis not present

## 2018-04-07 DIAGNOSIS — Z114 Encounter for screening for human immunodeficiency virus [HIV]: Secondary | ICD-10-CM | POA: Insufficient documentation

## 2018-04-07 DIAGNOSIS — Z9483 Pancreas transplant status: Secondary | ICD-10-CM | POA: Diagnosis not present

## 2018-04-07 DIAGNOSIS — E1122 Type 2 diabetes mellitus with diabetic chronic kidney disease: Secondary | ICD-10-CM | POA: Insufficient documentation

## 2018-04-07 DIAGNOSIS — Z09 Encounter for follow-up examination after completed treatment for conditions other than malignant neoplasm: Secondary | ICD-10-CM | POA: Diagnosis not present

## 2018-04-07 DIAGNOSIS — B259 Cytomegaloviral disease, unspecified: Secondary | ICD-10-CM | POA: Diagnosis not present

## 2018-04-07 DIAGNOSIS — D899 Disorder involving the immune mechanism, unspecified: Secondary | ICD-10-CM | POA: Diagnosis present

## 2018-04-07 DIAGNOSIS — N189 Chronic kidney disease, unspecified: Secondary | ICD-10-CM | POA: Insufficient documentation

## 2018-04-07 DIAGNOSIS — D631 Anemia in chronic kidney disease: Secondary | ICD-10-CM | POA: Insufficient documentation

## 2018-04-07 DIAGNOSIS — Z789 Other specified health status: Secondary | ICD-10-CM | POA: Diagnosis not present

## 2018-04-07 DIAGNOSIS — E1129 Type 2 diabetes mellitus with other diabetic kidney complication: Secondary | ICD-10-CM | POA: Insufficient documentation

## 2018-04-07 LAB — BASIC METABOLIC PANEL
ANION GAP: 9 (ref 5–15)
BUN: 35 mg/dL — ABNORMAL HIGH (ref 6–20)
CALCIUM: 10.1 mg/dL (ref 8.9–10.3)
CO2: 25 mmol/L (ref 22–32)
Chloride: 103 mmol/L (ref 101–111)
Creatinine, Ser: 1.56 mg/dL — ABNORMAL HIGH (ref 0.61–1.24)
GFR, EST AFRICAN AMERICAN: 49 mL/min — AB (ref >60)
GFR, EST NON AFRICAN AMERICAN: 43 mL/min — AB (ref >60)
Glucose, Bld: 125 mg/dL — ABNORMAL HIGH (ref 65–99)
Potassium: 4 mmol/L (ref 3.5–5.1)
SODIUM: 137 mmol/L (ref 135–145)

## 2018-04-07 LAB — CBC WITH DIFFERENTIAL/PLATELET
BASOS ABS: 0 10*3/uL (ref 0–0.1)
BASOS PCT: 1 %
Eosinophils Absolute: 0 10*3/uL (ref 0–0.7)
Eosinophils Relative: 1 %
HEMATOCRIT: 41.8 % (ref 40.0–52.0)
Hemoglobin: 13.5 g/dL (ref 13.0–18.0)
Lymphocytes Relative: 33 %
Lymphs Abs: 1.2 10*3/uL (ref 1.0–3.6)
MCH: 26.8 pg (ref 26.0–34.0)
MCHC: 32.2 g/dL (ref 32.0–36.0)
MCV: 83.2 fL (ref 80.0–100.0)
MONO ABS: 0.6 10*3/uL (ref 0.2–1.0)
Monocytes Relative: 17 %
NEUTROS ABS: 1.7 10*3/uL (ref 1.4–6.5)
NEUTROS PCT: 48 %
Platelets: 155 10*3/uL (ref 150–440)
RBC: 5.02 MIL/uL (ref 4.40–5.90)
RDW: 14.3 % (ref 11.5–14.5)
WBC: 3.5 10*3/uL — ABNORMAL LOW (ref 3.8–10.6)

## 2018-04-07 LAB — PHOSPHORUS: PHOSPHORUS: 3.7 mg/dL (ref 2.5–4.6)

## 2018-04-07 LAB — MAGNESIUM: Magnesium: 2 mg/dL (ref 1.7–2.4)

## 2018-05-05 ENCOUNTER — Other Ambulatory Visit
Admission: RE | Admit: 2018-05-05 | Discharge: 2018-05-05 | Disposition: A | Discharge status: Home / Self Care | Payer: Medicare Other | Source: Ambulatory Visit | Attending: Nephrology | Admitting: Nephrology

## 2018-05-05 DIAGNOSIS — Z940 Kidney transplant status: Secondary | ICD-10-CM | POA: Insufficient documentation

## 2018-05-05 DIAGNOSIS — Z114 Encounter for screening for human immunodeficiency virus [HIV]: Secondary | ICD-10-CM | POA: Insufficient documentation

## 2018-05-05 DIAGNOSIS — D631 Anemia in chronic kidney disease: Secondary | ICD-10-CM | POA: Diagnosis not present

## 2018-05-05 DIAGNOSIS — B259 Cytomegaloviral disease, unspecified: Secondary | ICD-10-CM | POA: Insufficient documentation

## 2018-05-05 DIAGNOSIS — E1129 Type 2 diabetes mellitus with other diabetic kidney complication: Secondary | ICD-10-CM | POA: Diagnosis not present

## 2018-05-05 DIAGNOSIS — Z79899 Other long term (current) drug therapy: Secondary | ICD-10-CM | POA: Diagnosis present

## 2018-05-05 DIAGNOSIS — N390 Urinary tract infection, site not specified: Secondary | ICD-10-CM | POA: Insufficient documentation

## 2018-05-05 DIAGNOSIS — Z09 Encounter for follow-up examination after completed treatment for conditions other than malignant neoplasm: Secondary | ICD-10-CM | POA: Diagnosis present

## 2018-05-05 DIAGNOSIS — Z789 Other specified health status: Secondary | ICD-10-CM | POA: Insufficient documentation

## 2018-05-05 DIAGNOSIS — Z9483 Pancreas transplant status: Secondary | ICD-10-CM | POA: Diagnosis not present

## 2018-05-05 DIAGNOSIS — D899 Disorder involving the immune mechanism, unspecified: Secondary | ICD-10-CM | POA: Insufficient documentation

## 2018-05-05 LAB — BASIC METABOLIC PANEL
ANION GAP: 9 (ref 5–15)
BUN: 30 mg/dL — ABNORMAL HIGH (ref 8–23)
CO2: 24 mmol/L (ref 22–32)
CREATININE: 1.38 mg/dL — AB (ref 0.61–1.24)
Calcium: 10 mg/dL (ref 8.9–10.3)
Chloride: 104 mmol/L (ref 98–111)
GFR calc non Af Amer: 50 mL/min — ABNORMAL LOW (ref >60)
GFR, EST AFRICAN AMERICAN: 57 mL/min — AB (ref >60)
GLUCOSE: 121 mg/dL — AB (ref 70–99)
Potassium: 4.2 mmol/L (ref 3.5–5.1)
Sodium: 137 mmol/L (ref 135–145)

## 2018-05-05 LAB — CBC WITH DIFFERENTIAL/PLATELET
BASOS ABS: 0 10*3/uL (ref 0–0.1)
Basophils Relative: 0 %
Eosinophils Absolute: 0 10*3/uL (ref 0–0.7)
Eosinophils Relative: 1 %
HCT: 40.1 % (ref 40.0–52.0)
Hemoglobin: 13.2 g/dL (ref 13.0–18.0)
LYMPHS PCT: 23 %
Lymphs Abs: 1.2 10*3/uL (ref 1.0–3.6)
MCH: 27.3 pg (ref 26.0–34.0)
MCHC: 32.8 g/dL (ref 32.0–36.0)
MCV: 83 fL (ref 80.0–100.0)
Monocytes Absolute: 0.8 10*3/uL (ref 0.2–1.0)
Monocytes Relative: 16 %
Neutro Abs: 3.2 10*3/uL (ref 1.4–6.5)
Neutrophils Relative %: 60 %
Platelets: 163 10*3/uL (ref 150–440)
RBC: 4.84 MIL/uL (ref 4.40–5.90)
RDW: 14.4 % (ref 11.5–14.5)
WBC: 5.3 10*3/uL (ref 3.8–10.6)

## 2018-05-05 LAB — MAGNESIUM: MAGNESIUM: 1.7 mg/dL (ref 1.7–2.4)

## 2018-05-05 LAB — PHOSPHORUS: Phosphorus: 2.8 mg/dL (ref 2.5–4.6)

## 2018-05-27 ENCOUNTER — Encounter: Payer: Self-pay | Admitting: *Deleted

## 2018-05-28 ENCOUNTER — Ambulatory Visit: Payer: Medicare Other | Admitting: Anesthesiology

## 2018-05-28 ENCOUNTER — Ambulatory Visit
Admission: RE | Admit: 2018-05-28 | Discharge: 2018-05-28 | Disposition: A | Discharge status: Home / Self Care | Payer: Medicare Other | Source: Ambulatory Visit | Attending: Internal Medicine | Admitting: Internal Medicine

## 2018-05-28 ENCOUNTER — Encounter: Payer: Self-pay | Admitting: *Deleted

## 2018-05-28 ENCOUNTER — Encounter
Admission: RE | Disposition: A | Discharge status: Home / Self Care | Payer: Self-pay | Source: Ambulatory Visit | Attending: Internal Medicine

## 2018-05-28 ENCOUNTER — Other Ambulatory Visit: Payer: Self-pay

## 2018-05-28 DIAGNOSIS — Z940 Kidney transplant status: Secondary | ICD-10-CM | POA: Diagnosis not present

## 2018-05-28 DIAGNOSIS — Z7982 Long term (current) use of aspirin: Secondary | ICD-10-CM | POA: Insufficient documentation

## 2018-05-28 DIAGNOSIS — Z7902 Long term (current) use of antithrombotics/antiplatelets: Secondary | ICD-10-CM | POA: Insufficient documentation

## 2018-05-28 DIAGNOSIS — Z800 Family history of malignant neoplasm of digestive organs: Secondary | ICD-10-CM | POA: Insufficient documentation

## 2018-05-28 DIAGNOSIS — E1140 Type 2 diabetes mellitus with diabetic neuropathy, unspecified: Secondary | ICD-10-CM | POA: Insufficient documentation

## 2018-05-28 DIAGNOSIS — Z86010 Personal history of colonic polyps: Secondary | ICD-10-CM | POA: Insufficient documentation

## 2018-05-28 DIAGNOSIS — K640 First degree hemorrhoids: Secondary | ICD-10-CM | POA: Insufficient documentation

## 2018-05-28 DIAGNOSIS — Z79899 Other long term (current) drug therapy: Secondary | ICD-10-CM | POA: Insufficient documentation

## 2018-05-28 DIAGNOSIS — Z7952 Long term (current) use of systemic steroids: Secondary | ICD-10-CM | POA: Diagnosis not present

## 2018-05-28 DIAGNOSIS — Z8673 Personal history of transient ischemic attack (TIA), and cerebral infarction without residual deficits: Secondary | ICD-10-CM | POA: Insufficient documentation

## 2018-05-28 DIAGNOSIS — K529 Noninfective gastroenteritis and colitis, unspecified: Secondary | ICD-10-CM | POA: Insufficient documentation

## 2018-05-28 DIAGNOSIS — Z85528 Personal history of other malignant neoplasm of kidney: Secondary | ICD-10-CM | POA: Insufficient documentation

## 2018-05-28 DIAGNOSIS — I10 Essential (primary) hypertension: Secondary | ICD-10-CM | POA: Diagnosis not present

## 2018-05-28 DIAGNOSIS — Z1211 Encounter for screening for malignant neoplasm of colon: Secondary | ICD-10-CM | POA: Diagnosis present

## 2018-05-28 DIAGNOSIS — Z794 Long term (current) use of insulin: Secondary | ICD-10-CM | POA: Diagnosis not present

## 2018-05-28 DIAGNOSIS — E785 Hyperlipidemia, unspecified: Secondary | ICD-10-CM | POA: Diagnosis not present

## 2018-05-28 HISTORY — DX: Essential (primary) hypertension: I10

## 2018-05-28 HISTORY — DX: Unspecified hydronephrosis: N13.30

## 2018-05-28 HISTORY — DX: Hyperlipidemia, unspecified: E78.5

## 2018-05-28 HISTORY — DX: Type 2 diabetes mellitus with diabetic neuropathy, unspecified: E11.40

## 2018-05-28 HISTORY — DX: Chronic kidney disease, unspecified: N18.9

## 2018-05-28 HISTORY — DX: Cerebral infarction, unspecified: I63.9

## 2018-05-28 HISTORY — PX: COLONOSCOPY WITH PROPOFOL: SHX5780

## 2018-05-28 HISTORY — DX: Calculus of gallbladder without cholecystitis without obstruction: K80.20

## 2018-05-28 HISTORY — DX: Malignant neoplasm of unspecified kidney, except renal pelvis: C64.9

## 2018-05-28 HISTORY — DX: Polyp of colon: K63.5

## 2018-05-28 HISTORY — DX: Unspecified osteoarthritis, unspecified site: M19.90

## 2018-05-28 LAB — GLUCOSE, CAPILLARY: GLUCOSE-CAPILLARY: 121 mg/dL — AB (ref 70–99)

## 2018-05-28 SURGERY — COLONOSCOPY WITH PROPOFOL
Anesthesia: General

## 2018-05-28 MED ORDER — FENTANYL CITRATE (PF) 100 MCG/2ML IJ SOLN
INTRAMUSCULAR | Status: DC | PRN
  Administered 2018-05-28 (×2): 50 ug via INTRAVENOUS

## 2018-05-28 MED ORDER — PROPOFOL 500 MG/50ML IV EMUL
INTRAVENOUS | Status: AC
  Filled 2018-05-28: qty 50

## 2018-05-28 MED ORDER — FENTANYL CITRATE (PF) 100 MCG/2ML IJ SOLN
INTRAMUSCULAR | Status: AC
  Filled 2018-05-28: qty 2

## 2018-05-28 MED ORDER — PROPOFOL 10 MG/ML IV BOLUS
INTRAVENOUS | Status: AC
  Filled 2018-05-28: qty 20

## 2018-05-28 MED ORDER — LIDOCAINE HCL (PF) 1 % IJ SOLN
INTRAMUSCULAR | Status: AC
  Filled 2018-05-28: qty 2

## 2018-05-28 MED ORDER — PROPOFOL 500 MG/50ML IV EMUL
INTRAVENOUS | Status: DC | PRN
  Administered 2018-05-28: 150 ug/kg/min via INTRAVENOUS

## 2018-05-28 MED ORDER — EPHEDRINE SULFATE 50 MG/ML IJ SOLN
INTRAMUSCULAR | Status: AC
  Filled 2018-05-28: qty 1

## 2018-05-28 MED ORDER — SODIUM CHLORIDE 0.9 % IV SOLN
INTRAVENOUS | Status: DC
  Administered 2018-05-28 (×2): via INTRAVENOUS

## 2018-05-28 MED ORDER — LIDOCAINE HCL (PF) 2 % IJ SOLN
INTRAMUSCULAR | Status: AC
  Filled 2018-05-28: qty 10

## 2018-05-28 MED ORDER — PROPOFOL 10 MG/ML IV BOLUS
INTRAVENOUS | Status: DC | PRN
  Administered 2018-05-28: 100 mg via INTRAVENOUS

## 2018-05-28 MED ORDER — SODIUM CHLORIDE 0.9 % IJ SOLN
INTRAMUSCULAR | Status: AC
  Filled 2018-05-28: qty 10

## 2018-05-28 MED ORDER — LIDOCAINE 2% (20 MG/ML) 5 ML SYRINGE
INTRAMUSCULAR | Status: DC | PRN
  Administered 2018-05-28: 30 mg via INTRAVENOUS

## 2018-05-28 NOTE — Anesthesia Postprocedure Evaluation (Signed)
Anesthesia Post Note  Patient: James Hicks  Procedure(s) Performed: COLONOSCOPY WITH PROPOFOL (N/A )  Patient location during evaluation: Endoscopy Anesthesia Type: General Level of consciousness: awake and alert Pain management: pain level controlled Vital Signs Assessment: post-procedure vital signs reviewed and stable Respiratory status: spontaneous breathing, nonlabored ventilation, respiratory function stable and patient connected to nasal cannula oxygen Cardiovascular status: blood pressure returned to baseline and stable Postop Assessment: no apparent nausea or vomiting Anesthetic complications: no     Last Vitals:  Vitals:   05/28/18 1026 05/28/18 1036  BP: 132/76 140/78  Pulse: 64 64  Resp: 13 (!) 21  Temp:    SpO2: 100% 100%    Last Pain:  Vitals:   05/28/18 1036  TempSrc:   PainSc: 0-No pain                 Precious Haws Danasia Baker

## 2018-05-28 NOTE — Anesthesia Preprocedure Evaluation (Addendum)
Anesthesia Evaluation  Patient identified by MRN, date of birth, ID band Patient awake    Reviewed: Allergy & Precautions, H&P , NPO status , Patient's Chart, lab work & pertinent test results  History of Anesthesia Complications (+) DIFFICULT IV STICK / SPECIAL LINE and history of anesthetic complications  Airway Mallampati: III  TM Distance: >3 FB Neck ROM: limited    Dental  (+) Chipped, Poor Dentition, Missing   Pulmonary neg pulmonary ROS, neg shortness of breath,           Cardiovascular Exercise Tolerance: Good hypertension, (-) angina(-) Past MI and (-) DOE      Neuro/Psych CVA, Residual Symptoms negative psych ROS   GI/Hepatic negative GI ROS, Neg liver ROS,   Endo/Other  negative endocrine ROSdiabetes  Renal/GU CRFRenal disease  negative genitourinary   Musculoskeletal   Abdominal   Peds  Hematology negative hematology ROS (+)   Anesthesia Other Findings Past Medical History: No date: Cholelithiasis No date: Chronic renal failure     Comment:  kidney transplant  No date: Colon polyps No date: CVA (cerebral infarction) No date: Degenerative joint disease No date: Diabetes mellitus without complication (HCC) No date: Diabetic neuropathy (HCC) No date: Hydronephrosis of right kidney No date: Hyperlipidemia No date: Hypertension No date: Renal cell carcinoma (HCC) No date: Stroke [facility])  Past Surgical History: No date: CATARACT EXTRACTION No date: CHOLECYSTECTOMY No date: COLONOSCOPY No date: KIDNEY TRANSPLANT No date: KNEE ARTHROSCOPY No date: NEPHRECTOMY No date: TONSILLECTOMY No date: WISDOM TOOTH EXTRACTION  BMI    Body Mass Index:  33.75 kg/m      Reproductive/Obstetrics negative OB ROS                            Anesthesia Physical Anesthesia Plan  ASA: IV  Anesthesia Plan: General   Post-op Pain Management:    Induction:   PONV Risk Score and Plan:  Propofol infusion and TIVA  Airway Management Planned:   Additional Equipment:   Intra-op Plan:   Post-operative Plan:   Informed Consent: I have reviewed the patients History and Physical, chart, labs and discussed the procedure including the risks, benefits and alternatives for the proposed anesthesia with the patient or authorized representative who has indicated his/her understanding and acceptance.   Dental Advisory Given  Plan Discussed with: Anesthesiologist, CRNA and Surgeon  Anesthesia Plan Comments:         Anesthesia Quick Evaluation

## 2018-05-28 NOTE — H&P (Signed)
Outpatient short stay form Pre-procedure 05/28/2018 7:55 AM James Hicks, M.D.  Primary Physician: Derinda Late, M.D.  Reason for visit:  Personal hx of colon polyps, Family hx of colon cancer.  History of present illness:  As above. Patient denies change in bowel habits, rectal bleeding, weight loss or abdominal pain. Some minor discomfort from time to time in the lower abdomen from "scar tissue" from previous surgeries. He has hx of renal cell CA and has undergone a renal transplant. He takes Cellcept to maintain his allograft.     Current Facility-Administered Medications:  .  lidocaine (PF) (XYLOCAINE) 1 % injection, , , ,  .  0.9 %  sodium chloride infusion, , Intravenous, Continuous, [place], Benay Pike, MD  Medications Prior to Admission  Medication Sig Dispense Refill Last Dose  . allopurinol (ZYLOPRIM) 100 MG tablet Take 100 mg by mouth daily.   Past Week at Unknown time  . aspirin EC 81 MG tablet Take 81 mg by mouth daily.   05/21/2018 at Unknown time  . atorvastatin (LIPITOR) 10 MG tablet Take 10 mg by mouth daily.   Past Week at Unknown time  . calcium-vitamin D (OSCAL WITH D) 500-200 MG-UNIT tablet Take 1 tablet by mouth 2 (two) times daily.   05/27/2018 at 0800  . clopidogrel (PLAVIX) 75 MG tablet Take 75 mg by mouth daily.   05/22/2018 at Unknown time  . colchicine 0.6 MG tablet Take 0.6 mg by mouth daily.   Past Month at Unknown time  . enalapril (VASOTEC) 20 MG tablet Take 20 mg by mouth daily.   05/27/2018 at 0800  . furosemide (LASIX) 20 MG tablet Take 20 mg by mouth 2 (two) times daily.   05/27/2018 at 0800  . hydrochlorothiazide (HYDRODIURIL) 50 MG tablet Take 50 mg by mouth daily.   Past Week at Unknown time  . insulin glargine (LANTUS) 100 UNIT/ML injection Inject into the skin at bedtime.   Past Week at Unknown time  . insulin lispro (HUMALOG) 100 UNIT/ML injection Inject into the skin 3 (three) times daily with meals.   05/27/2018 at 1400  . mycophenolate  (CELLCEPT) 500 MG tablet Take by mouth 2 (two) times daily.   05/27/2018 at 2000  . NIFEdipine (PROCARDIA XL/ADALAT-CC) 60 MG 24 hr tablet Take 60 mg by mouth 2 (two) times daily.   05/27/2018 at 0800  . predniSONE (DELTASONE) 10 MG tablet Take 10 mg by mouth daily with breakfast.   05/27/2018 at 0800  . sildenafil (REVATIO) 20 MG tablet Take 20 mg by mouth as needed.   Past Week at Unknown time  . tacrolimus (PROGRAF) 5 MG capsule Take 5 mg by mouth 2 (two) times daily.   05/27/2018 at 0800  . timolol (TIMOPTIC) 0.25 % ophthalmic solution 1 drop 2 (two) times daily.   05/27/2018 at 0800  . DULoxetine (CYMBALTA) 30 MG capsule Take 30 mg by mouth daily.   Not Taking at Unknown time     Allergies  Allergen Reactions  . Synvisc [Hylan G-F 20] Anaphylaxis  . Latex   . Other     IBAND     Past Medical History:  Diagnosis Date  . Cholelithiasis   . Chronic renal failure    kidney transplant   . Colon polyps   . CVA (cerebral infarction)   . Degenerative joint disease   . Diabetes mellitus without complication ([place])   . Diabetic neuropathy ([place])   . Hydronephrosis of right kidney   . Hyperlipidemia   .  Hypertension   . Renal cell carcinoma ([place])   . Stroke [facility])     Review of systems:  Otherwise negative.    Physical Exam  Gen: Alert, oriented. Appears stated age.  HEENT: [place]/AT. PERRLA. Lungs: CTA, no wheezes. CV: RR nl S1, S2. Abd: soft, benign, no masses. BS+ Ext: No edema. Pulses 2+    Planned procedures: Proceed with colonoscopy. The patient understands the nature of the planned procedure, indications, risks, alternatives and potential complications including but not limited to bleeding, infection, perforation, damage to internal organs and possible oversedation/side effects from anesthesia. The patient agrees and gives consent to proceed.  Please refer to procedure notes for findings, recommendations and patient disposition/instructions.     James Hicks,  M.D. Gastroenterology 05/28/2018  7:55 AM

## 2018-05-28 NOTE — Transfer of Care (Signed)
Immediate Anesthesia Transfer of Care Note  Patient: James Hicks  Procedure(s) Performed: COLONOSCOPY WITH PROPOFOL (N/A )  Patient Location: PACU and Endoscopy Unit  Anesthesia Type:General  Level of Consciousness: sedated  Airway & Oxygen Therapy: Patient Spontanous Breathing and Patient connected to nasal cannula oxygen  Post-op Assessment: Report given to RN and Post -op Vital signs reviewed and stable  Post vital signs: Reviewed and stable  Last Vitals:  Vitals Value Taken Time  BP 99/43 05/28/2018  9:46 AM  Temp 36 C 05/28/2018  9:46 AM  Pulse 64 05/28/2018  9:47 AM  Resp 9 05/28/2018  9:47 AM  SpO2 100 % 05/28/2018  9:47 AM  Vitals shown include unvalidated device data.  Last Pain:  Vitals:   05/28/18 0946  TempSrc: Tympanic  PainSc:          Complications: No apparent anesthesia complications

## 2018-05-28 NOTE — Anesthesia Post-op Follow-up Note (Signed)
Anesthesia QCDR form completed.        

## 2018-05-28 NOTE — Interval H&P Note (Signed)
History and Physical Interval Note:  05/28/2018 7:57 AM  James Hicks  has presented today for surgery, with the diagnosis of PRS HX COLON POLYPS FM HX COLON CANCER  The various methods of treatment have been discussed with the patient and family. After consideration of risks, benefits and other options for treatment, the patient has consented to  Procedure(s): COLONOSCOPY WITH PROPOFOL (N/A) as a surgical intervention .  The patient's history has been reviewed, patient examined, no change in status, stable for surgery.  I have reviewed the patient's chart and labs.  Questions were answered to the patient's satisfaction.     [place], [place]

## 2018-05-28 NOTE — Op Note (Signed)
[facility] Gastroenterology Patient Name: James Hicks Procedure Date: 05/28/2018 7:16 AM MRN: [id number] Account #: [id number] Date of Birth: 1946-05-14 Admit Type: Outpatient Age: 72 Room: [facility] ENDO ROOM 2 Gender: Male Note Status: Finalized Procedure:            Colonoscopy Indications:          High risk colon cancer surveillance: Personal history                        of colonic polyps Providers:            Benay Pike. Alice Reichert MD, MD Referring MD:         Caprice Renshaw MD (Referring MD) Complications:        No immediate complications. Procedure:            Pre-Anesthesia Assessment:                       - The risks and benefits of the procedure and the                        sedation options and risks were discussed with the                        patient. All questions were answered and informed                        consent was obtained.                       - Patient identification and proposed procedure were                        verified prior to the procedure by the nurse. The                        procedure was verified in the procedure room.                       - ASA Grade Assessment: III - A patient with severe                        systemic disease.                       - After reviewing the risks and benefits, the patient                        was deemed in satisfactory condition to undergo the                        procedure.                       After obtaining informed consent, the colonoscope was                        passed under direct vision. Throughout the procedure,                        the patient's blood pressure, pulse, and oxygen  saturations were monitored continuously. The                        Colonoscope was introduced through the anus and                        advanced to the the cecum, identified by appendiceal                        orifice and ileocecal valve. The colonoscopy was                     performed without difficulty. The patient tolerated the                        procedure well. Findings:      The perianal and digital rectal examinations were normal. Pertinent       negatives include normal sphincter tone and no palpable rectal lesions.      The mucosa vascular pattern in the transverse colon was diffusely       increased. Biopsies were taken with a cold forceps for histology.      Non-bleeding internal hemorrhoids were found during retroflexion. The       hemorrhoids were mild and Grade I (internal hemorrhoids that do not       prolapse).      The exam was otherwise without abnormality. Impression:           - Increased mucosa vascular pattern in the transverse                        colon. Biopsied.                       - Non-bleeding internal hemorrhoids.                       - The examination was otherwise normal. Recommendation:       - Patient has a contact number available for                        emergencies. The signs and symptoms of potential                        delayed complications were discussed with the patient.                        Return to normal activities tomorrow. Written discharge                        instructions were provided to the patient.                       - Resume previous diet.                       - Continue present medications.                       - Await pathology results.                       - No repeat colonoscopy due to age.                       -  Return to GI office PRN.                       - The findings and recommendations were discussed with                        the patient and their family. Procedure Code(s):    --- Professional ---                       [phone], Colonoscopy, flexible; with biopsy, single or                        multiple Diagnosis Code(s):    --- Professional ---                       K64.0, First degree hemorrhoids                       Z86.010, Personal history of  colonic polyps CPT copyright 2017 [employer]. All rights reserved. The codes documented in this report are preliminary and upon coder review may  be revised to meet current compliance requirements. Efrain Sella MD, MD 05/28/2018 9:42:16 AM This report has been signed electronically. Number of Addenda: 0 Note Initiated On: 05/28/2018 7:16 AM Scope Withdrawal Time: 0 hours 11 minutes 23 seconds  Total Procedure Duration: 0 hours 17 minutes 8 seconds       [facility]

## 2018-05-29 ENCOUNTER — Encounter: Payer: Self-pay | Admitting: Internal Medicine

## 2018-05-31 LAB — SURGICAL PATHOLOGY

## 2018-06-16 ENCOUNTER — Other Ambulatory Visit
Admission: RE | Admit: 2018-06-16 | Discharge: 2018-06-16 | Disposition: A | Discharge status: Home / Self Care | Payer: Medicare Other | Source: Ambulatory Visit | Attending: Nephrology | Admitting: Nephrology

## 2018-06-16 DIAGNOSIS — Z114 Encounter for screening for human immunodeficiency virus [HIV]: Secondary | ICD-10-CM | POA: Insufficient documentation

## 2018-06-16 DIAGNOSIS — N390 Urinary tract infection, site not specified: Secondary | ICD-10-CM | POA: Diagnosis not present

## 2018-06-16 DIAGNOSIS — Z9483 Pancreas transplant status: Secondary | ICD-10-CM | POA: Diagnosis not present

## 2018-06-16 DIAGNOSIS — Z09 Encounter for follow-up examination after completed treatment for conditions other than malignant neoplasm: Secondary | ICD-10-CM | POA: Insufficient documentation

## 2018-06-16 DIAGNOSIS — B259 Cytomegaloviral disease, unspecified: Secondary | ICD-10-CM | POA: Insufficient documentation

## 2018-06-16 DIAGNOSIS — E1129 Type 2 diabetes mellitus with other diabetic kidney complication: Secondary | ICD-10-CM | POA: Insufficient documentation

## 2018-06-16 DIAGNOSIS — Z789 Other specified health status: Secondary | ICD-10-CM | POA: Diagnosis not present

## 2018-06-16 DIAGNOSIS — D899 Disorder involving the immune mechanism, unspecified: Secondary | ICD-10-CM | POA: Insufficient documentation

## 2018-06-16 DIAGNOSIS — E559 Vitamin D deficiency, unspecified: Secondary | ICD-10-CM | POA: Diagnosis not present

## 2018-06-16 DIAGNOSIS — Z940 Kidney transplant status: Secondary | ICD-10-CM | POA: Diagnosis present

## 2018-06-16 DIAGNOSIS — D631 Anemia in chronic kidney disease: Secondary | ICD-10-CM | POA: Diagnosis not present

## 2018-06-16 DIAGNOSIS — Z79899 Other long term (current) drug therapy: Secondary | ICD-10-CM | POA: Diagnosis present

## 2018-06-16 LAB — BASIC METABOLIC PANEL
Anion gap: 8 (ref 5–15)
BUN: 30 mg/dL — AB (ref 8–23)
CO2: 26 mmol/L (ref 22–32)
CREATININE: 1.45 mg/dL — AB (ref 0.61–1.24)
Calcium: 10.1 mg/dL (ref 8.9–10.3)
Chloride: 103 mmol/L (ref 98–111)
GFR calc Af Amer: 54 mL/min — ABNORMAL LOW (ref >60)
GFR calc non Af Amer: 47 mL/min — ABNORMAL LOW (ref >60)
GLUCOSE: 157 mg/dL — AB (ref 70–99)
POTASSIUM: 4.5 mmol/L (ref 3.5–5.1)
SODIUM: 137 mmol/L (ref 135–145)

## 2018-06-16 LAB — CBC WITH DIFFERENTIAL/PLATELET
BASOS PCT: 1 %
Basophils Absolute: 0 10*3/uL (ref 0–0.1)
Eosinophils Absolute: 0 10*3/uL (ref 0–0.7)
Eosinophils Relative: 1 %
HEMATOCRIT: 40.2 % (ref 40.0–52.0)
HEMOGLOBIN: 13.3 g/dL (ref 13.0–18.0)
LYMPHS ABS: 1.1 10*3/uL (ref 1.0–3.6)
LYMPHS PCT: 22 %
MCH: 27.6 pg (ref 26.0–34.0)
MCHC: 33.1 g/dL (ref 32.0–36.0)
MCV: 83.4 fL (ref 80.0–100.0)
MONO ABS: 0.7 10*3/uL (ref 0.2–1.0)
MONOS PCT: 13 %
NEUTROS PCT: 63 %
Neutro Abs: 3.3 10*3/uL (ref 1.4–6.5)
Platelets: 195 10*3/uL (ref 150–440)
RBC: 4.82 MIL/uL (ref 4.40–5.90)
RDW: 14.3 % (ref 11.5–14.5)
WBC: 5.1 10*3/uL (ref 3.8–10.6)

## 2018-06-16 LAB — PHOSPHORUS: Phosphorus: 2.9 mg/dL (ref 2.5–4.6)

## 2018-06-16 LAB — MAGNESIUM: Magnesium: 1.8 mg/dL (ref 1.7–2.4)

## 2018-07-07 ENCOUNTER — Other Ambulatory Visit
Admission: RE | Admit: 2018-07-07 | Discharge: 2018-07-07 | Disposition: A | Discharge status: Home / Self Care | Payer: Medicare Other | Source: Ambulatory Visit | Attending: Nephrology | Admitting: Nephrology

## 2018-07-07 DIAGNOSIS — Z9483 Pancreas transplant status: Secondary | ICD-10-CM | POA: Diagnosis not present

## 2018-07-07 DIAGNOSIS — E1129 Type 2 diabetes mellitus with other diabetic kidney complication: Secondary | ICD-10-CM | POA: Diagnosis not present

## 2018-07-07 DIAGNOSIS — D631 Anemia in chronic kidney disease: Secondary | ICD-10-CM | POA: Diagnosis not present

## 2018-07-07 DIAGNOSIS — Z114 Encounter for screening for human immunodeficiency virus [HIV]: Secondary | ICD-10-CM | POA: Insufficient documentation

## 2018-07-07 DIAGNOSIS — N390 Urinary tract infection, site not specified: Secondary | ICD-10-CM | POA: Insufficient documentation

## 2018-07-07 DIAGNOSIS — Z789 Other specified health status: Secondary | ICD-10-CM | POA: Diagnosis not present

## 2018-07-07 DIAGNOSIS — Z79899 Other long term (current) drug therapy: Secondary | ICD-10-CM | POA: Diagnosis present

## 2018-07-07 DIAGNOSIS — D899 Disorder involving the immune mechanism, unspecified: Secondary | ICD-10-CM | POA: Insufficient documentation

## 2018-07-07 DIAGNOSIS — Z940 Kidney transplant status: Secondary | ICD-10-CM | POA: Insufficient documentation

## 2018-07-07 DIAGNOSIS — B259 Cytomegaloviral disease, unspecified: Secondary | ICD-10-CM | POA: Diagnosis not present

## 2018-07-07 DIAGNOSIS — Z09 Encounter for follow-up examination after completed treatment for conditions other than malignant neoplasm: Secondary | ICD-10-CM | POA: Diagnosis present

## 2018-07-07 LAB — CBC WITH DIFFERENTIAL/PLATELET
Basophils Absolute: 0 K/uL (ref 0–0.1)
Basophils Relative: 1 %
Eosinophils Absolute: 0 K/uL (ref 0–0.7)
Eosinophils Relative: 1 %
HCT: 41.1 % (ref 40.0–52.0)
Hemoglobin: 13.4 g/dL (ref 13.0–18.0)
Lymphocytes Relative: 25 %
Lymphs Abs: 1.1 K/uL (ref 1.0–3.6)
MCH: 27.3 pg (ref 26.0–34.0)
MCHC: 32.5 g/dL (ref 32.0–36.0)
MCV: 84.1 fL (ref 80.0–100.0)
Monocytes Absolute: 0.6 K/uL (ref 0.2–1.0)
Monocytes Relative: 14 %
Neutro Abs: 2.5 K/uL (ref 1.4–6.5)
Neutrophils Relative %: 59 %
Platelets: 162 K/uL (ref 150–440)
RBC: 4.89 MIL/uL (ref 4.40–5.90)
RDW: 14.4 % (ref 11.5–14.5)
WBC: 4.2 K/uL (ref 3.8–10.6)

## 2018-07-07 LAB — BASIC METABOLIC PANEL WITH GFR
Anion gap: 6 (ref 5–15)
BUN: 28 mg/dL — ABNORMAL HIGH (ref 8–23)
CO2: 25 mmol/L (ref 22–32)
Calcium: 9.8 mg/dL (ref 8.9–10.3)
Chloride: 105 mmol/L (ref 98–111)
Creatinine, Ser: 1.32 mg/dL — ABNORMAL HIGH (ref 0.61–1.24)
GFR calc Af Amer: >60 mL/min
GFR calc non Af Amer: 52 mL/min — ABNORMAL LOW
Glucose, Bld: 143 mg/dL — ABNORMAL HIGH (ref 70–99)
Potassium: 4.2 mmol/L (ref 3.5–5.1)
Sodium: 136 mmol/L (ref 135–145)

## 2018-07-07 LAB — PHOSPHORUS: Phosphorus: 2.9 mg/dL (ref 2.5–4.6)

## 2018-07-07 LAB — MAGNESIUM: MAGNESIUM: 1.7 mg/dL (ref 1.7–2.4)

## 2018-07-31 ENCOUNTER — Other Ambulatory Visit
Admission: RE | Admit: 2018-07-31 | Discharge: 2018-07-31 | Disposition: A | Discharge status: Home / Self Care | Payer: Medicare Other | Source: Ambulatory Visit | Attending: Nephrology | Admitting: Nephrology

## 2018-07-31 DIAGNOSIS — Z789 Other specified health status: Secondary | ICD-10-CM | POA: Insufficient documentation

## 2018-07-31 DIAGNOSIS — Z114 Encounter for screening for human immunodeficiency virus [HIV]: Secondary | ICD-10-CM | POA: Diagnosis not present

## 2018-07-31 DIAGNOSIS — X58XXXA Exposure to other specified factors, initial encounter: Secondary | ICD-10-CM | POA: Diagnosis not present

## 2018-07-31 DIAGNOSIS — Z9483 Pancreas transplant status: Secondary | ICD-10-CM | POA: Diagnosis not present

## 2018-07-31 DIAGNOSIS — N390 Urinary tract infection, site not specified: Secondary | ICD-10-CM | POA: Insufficient documentation

## 2018-07-31 DIAGNOSIS — B259 Cytomegaloviral disease, unspecified: Secondary | ICD-10-CM | POA: Insufficient documentation

## 2018-07-31 DIAGNOSIS — Z79899 Other long term (current) drug therapy: Secondary | ICD-10-CM | POA: Diagnosis not present

## 2018-07-31 DIAGNOSIS — D899 Disorder involving the immune mechanism, unspecified: Secondary | ICD-10-CM | POA: Diagnosis present

## 2018-07-31 DIAGNOSIS — Z940 Kidney transplant status: Secondary | ICD-10-CM | POA: Insufficient documentation

## 2018-07-31 DIAGNOSIS — E1129 Type 2 diabetes mellitus with other diabetic kidney complication: Secondary | ICD-10-CM | POA: Diagnosis not present

## 2018-07-31 DIAGNOSIS — D631 Anemia in chronic kidney disease: Secondary | ICD-10-CM | POA: Insufficient documentation

## 2018-07-31 DIAGNOSIS — T8610 Unspecified complication of kidney transplant: Secondary | ICD-10-CM | POA: Insufficient documentation

## 2018-07-31 DIAGNOSIS — Z09 Encounter for follow-up examination after completed treatment for conditions other than malignant neoplasm: Secondary | ICD-10-CM | POA: Insufficient documentation

## 2018-07-31 LAB — DIFFERENTIAL
Basophils Absolute: 0 10*3/uL (ref 0–0.1)
Basophils Relative: 1 %
EOS ABS: 0 10*3/uL (ref 0–0.7)
EOS PCT: 1 %
LYMPHS PCT: 25 %
Lymphs Abs: 1.3 10*3/uL (ref 1.0–3.6)
MONO ABS: 0.8 10*3/uL (ref 0.2–1.0)
MONOS PCT: 15 %
NEUTROS PCT: 58 %
Neutro Abs: 3 10*3/uL (ref 1.4–6.5)

## 2018-07-31 LAB — CBC
HEMATOCRIT: 40.6 % (ref 40.0–52.0)
HEMOGLOBIN: 13.5 g/dL (ref 13.0–18.0)
MCH: 28.3 pg (ref 26.0–34.0)
MCHC: 33.3 g/dL (ref 32.0–36.0)
MCV: 84.9 fL (ref 80.0–100.0)
Platelets: 163 10*3/uL (ref 150–440)
RBC: 4.78 MIL/uL (ref 4.40–5.90)
RDW: 14.4 % (ref 11.5–14.5)
WBC: 5.2 10*3/uL (ref 3.8–10.6)

## 2018-07-31 LAB — BASIC METABOLIC PANEL
Anion gap: 7 (ref 5–15)
BUN: 27 mg/dL — AB (ref 8–23)
CHLORIDE: 104 mmol/L (ref 98–111)
CO2: 28 mmol/L (ref 22–32)
CREATININE: 1.33 mg/dL — AB (ref 0.61–1.24)
Calcium: 10 mg/dL (ref 8.9–10.3)
GFR calc Af Amer: 60 mL/min — ABNORMAL LOW (ref >60)
GFR calc non Af Amer: 51 mL/min — ABNORMAL LOW (ref >60)
GLUCOSE: 105 mg/dL — AB (ref 70–99)
Potassium: 4.7 mmol/L (ref 3.5–5.1)
Sodium: 139 mmol/L (ref 135–145)

## 2018-07-31 LAB — MAGNESIUM: Magnesium: 1.8 mg/dL (ref 1.7–2.4)

## 2018-07-31 LAB — PHOSPHORUS: PHOSPHORUS: 3.4 mg/dL (ref 2.5–4.6)

## 2018-08-13 ENCOUNTER — Emergency Department: Payer: No Typology Code available for payment source

## 2018-08-13 ENCOUNTER — Encounter: Payer: Self-pay | Admitting: Emergency Medicine

## 2018-08-13 ENCOUNTER — Other Ambulatory Visit: Payer: Self-pay

## 2018-08-13 ENCOUNTER — Emergency Department
Admission: EM | Admit: 2018-08-13 | Discharge: 2018-08-13 | Disposition: A | Discharge status: Home / Self Care | Payer: No Typology Code available for payment source | Attending: Emergency Medicine | Admitting: Emergency Medicine

## 2018-08-13 DIAGNOSIS — R410 Disorientation, unspecified: Secondary | ICD-10-CM | POA: Insufficient documentation

## 2018-08-13 DIAGNOSIS — Y9389 Activity, other specified: Secondary | ICD-10-CM | POA: Insufficient documentation

## 2018-08-13 DIAGNOSIS — E162 Hypoglycemia, unspecified: Secondary | ICD-10-CM

## 2018-08-13 DIAGNOSIS — Z79899 Other long term (current) drug therapy: Secondary | ICD-10-CM | POA: Diagnosis not present

## 2018-08-13 DIAGNOSIS — Z940 Kidney transplant status: Secondary | ICD-10-CM | POA: Diagnosis not present

## 2018-08-13 DIAGNOSIS — E1140 Type 2 diabetes mellitus with diabetic neuropathy, unspecified: Secondary | ICD-10-CM | POA: Diagnosis not present

## 2018-08-13 DIAGNOSIS — Y92481 Parking lot as the place of occurrence of the external cause: Secondary | ICD-10-CM | POA: Insufficient documentation

## 2018-08-13 DIAGNOSIS — I10 Essential (primary) hypertension: Secondary | ICD-10-CM | POA: Insufficient documentation

## 2018-08-13 DIAGNOSIS — Y999 Unspecified external cause status: Secondary | ICD-10-CM | POA: Insufficient documentation

## 2018-08-13 DIAGNOSIS — Z794 Long term (current) use of insulin: Secondary | ICD-10-CM | POA: Diagnosis not present

## 2018-08-13 DIAGNOSIS — Z7982 Long term (current) use of aspirin: Secondary | ICD-10-CM | POA: Insufficient documentation

## 2018-08-13 DIAGNOSIS — E11649 Type 2 diabetes mellitus with hypoglycemia without coma: Secondary | ICD-10-CM | POA: Diagnosis present

## 2018-08-13 LAB — GLUCOSE, CAPILLARY
GLUCOSE-CAPILLARY: 62 mg/dL — AB (ref 70–99)
GLUCOSE-CAPILLARY: 73 mg/dL (ref 70–99)
GLUCOSE-CAPILLARY: 118 mg/dL — AB (ref 70–99)
GLUCOSE-CAPILLARY: 141 mg/dL — AB (ref 70–99)
Glucose-Capillary: 152 mg/dL — ABNORMAL HIGH (ref 70–99)
Glucose-Capillary: 174 mg/dL — ABNORMAL HIGH (ref 70–99)

## 2018-08-13 LAB — CBC WITH DIFFERENTIAL/PLATELET
ABS IMMATURE GRANULOCYTES: 0.02 10*3/uL (ref 0.00–0.07)
BASOS ABS: 0 10*3/uL (ref 0.0–0.1)
BASOS PCT: 1 %
EOS ABS: 0 10*3/uL (ref 0.0–0.5)
Eosinophils Relative: 1 %
HCT: 41.3 % (ref 39.0–52.0)
Hemoglobin: 12.8 g/dL — ABNORMAL LOW (ref 13.0–17.0)
IMMATURE GRANULOCYTES: 1 %
LYMPHS ABS: 1.3 10*3/uL (ref 0.7–4.0)
Lymphocytes Relative: 29 %
MCH: 26.6 pg (ref 26.0–34.0)
MCHC: 31 g/dL (ref 30.0–36.0)
MCV: 85.9 fL (ref 80.0–100.0)
Monocytes Absolute: 0.7 10*3/uL (ref 0.1–1.0)
Monocytes Relative: 16 %
NEUTROS ABS: 2.3 10*3/uL (ref 1.7–7.7)
NEUTROS PCT: 52 %
NRBC: 0 % (ref 0.0–0.2)
PLATELETS: 175 10*3/uL (ref 150–400)
RBC: 4.81 MIL/uL (ref 4.22–5.81)
RDW: 13.2 % (ref 11.5–15.5)
WBC: 4.3 10*3/uL (ref 4.0–10.5)

## 2018-08-13 LAB — COMPREHENSIVE METABOLIC PANEL
ALBUMIN: 4.1 g/dL (ref 3.5–5.0)
ALT: 15 U/L (ref 0–44)
ANION GAP: 8 (ref 5–15)
AST: 20 U/L (ref 15–41)
Alkaline Phosphatase: 49 U/L (ref 38–126)
BUN: 24 mg/dL — AB (ref 8–23)
CHLORIDE: 110 mmol/L (ref 98–111)
CO2: 23 mmol/L (ref 22–32)
Calcium: 9.9 mg/dL (ref 8.9–10.3)
Creatinine, Ser: 1.12 mg/dL (ref 0.61–1.24)
GFR calc Af Amer: >60 mL/min (ref >60)
GFR calc non Af Amer: >60 mL/min (ref >60)
GLUCOSE: 56 mg/dL — AB (ref 70–99)
POTASSIUM: 4 mmol/L (ref 3.5–5.1)
Sodium: 141 mmol/L (ref 135–145)
Total Bilirubin: 0.7 mg/dL (ref 0.3–1.2)
Total Protein: 6.9 g/dL (ref 6.5–8.1)

## 2018-08-13 LAB — URINALYSIS, COMPLETE (UACMP) WITH MICROSCOPIC
Bacteria, UA: NONE SEEN
Bilirubin Urine: NEGATIVE
GLUCOSE, UA: 150 mg/dL — AB
Hgb urine dipstick: NEGATIVE
Ketones, ur: NEGATIVE mg/dL
Leukocytes, UA: NEGATIVE
Nitrite: NEGATIVE
Protein, ur: NEGATIVE mg/dL
SPECIFIC GRAVITY, URINE: 1.013 (ref 1.005–1.030)
WBC, UA: NONE SEEN WBC/hpf (ref 0–5)
pH: 6 (ref 5.0–8.0)

## 2018-08-13 LAB — BLOOD GAS, VENOUS
Acid-Base Excess: 5.1 mmol/L — ABNORMAL HIGH (ref 0.0–2.0)
BICARBONATE: 29.9 mmol/L — AB (ref 20.0–28.0)
O2 Saturation: 92.3 %
PH VEN: 7.44 — AB (ref 7.250–7.430)
Patient temperature: 37
pCO2, Ven: 44 mmHg (ref 44.0–60.0)
pO2, Ven: 62 mmHg — ABNORMAL HIGH (ref 32.0–45.0)

## 2018-08-13 LAB — PROTIME-INR
INR: 1.02
PROTHROMBIN TIME: 13.3 s (ref 11.4–15.2)

## 2018-08-13 LAB — ETHANOL

## 2018-08-13 LAB — TROPONIN I
Troponin I: <0.03 ng/mL (ref <0.03)
Troponin I: <0.03 ng/mL (ref <0.03)

## 2018-08-13 MED ORDER — DEXTROSE 50 % IV SOLN
INTRAVENOUS | Status: AC
  Filled 2018-08-13: qty 50

## 2018-08-13 MED ORDER — DEXTROSE 50 % IV SOLN
1.0000 | Freq: Once | INTRAVENOUS | Status: AC
  Administered 2018-08-13: 50 mL via INTRAVENOUS
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
## 2018-08-13 NOTE — ED Notes (Signed)
Verbal orders to give another amp of d50 at this time , pt still appears drowsy

## 2018-08-13 NOTE — ED Notes (Signed)
Glucose 62 , per MD orders for 1 amp of D50

## 2018-08-13 NOTE — Discharge Instructions (Addendum)
You would prefer not to be admitted to the hospital which is your choice at this time we do not see any acute injury.  You were somewhat slow to respond when you came in, we think this is likely because your sugar was low which is likely because of your insulin.  That is corrected itself since you have been here, and you would prefer to go home.  This is certainly your choice, however, we want you  to stay with family eat well today if you have any chest pain shortness of breath numbness or weakness or other concerns including confusion return to the emergency room.  Please make sure that your wife is with you when you take your insulin to make sure there is no mistake.  Follow-up with your doctor tomorrow.

## 2018-08-13 NOTE — ED Notes (Signed)
Pt drank 1 cup of orange juice per MD

## 2018-08-13 NOTE — ED Notes (Signed)
Pt up eating sandwich tray at this time , pt appears more alert. Per spouse pt appears more at baseline at this time. MD aware. Will continue to monitor

## 2018-09-18 ENCOUNTER — Other Ambulatory Visit
Admission: RE | Admit: 2018-09-18 | Discharge: 2018-09-18 | Disposition: A | Discharge status: Home / Self Care | Payer: Medicare Other | Source: Ambulatory Visit | Attending: Nephrology | Admitting: Nephrology

## 2018-09-18 DIAGNOSIS — Z79899 Other long term (current) drug therapy: Secondary | ICD-10-CM | POA: Diagnosis not present

## 2018-09-18 DIAGNOSIS — E1129 Type 2 diabetes mellitus with other diabetic kidney complication: Secondary | ICD-10-CM | POA: Diagnosis not present

## 2018-09-18 DIAGNOSIS — N189 Chronic kidney disease, unspecified: Secondary | ICD-10-CM | POA: Insufficient documentation

## 2018-09-18 DIAGNOSIS — Z114 Encounter for screening for human immunodeficiency virus [HIV]: Secondary | ICD-10-CM | POA: Diagnosis not present

## 2018-09-18 DIAGNOSIS — D899 Disorder involving the immune mechanism, unspecified: Secondary | ICD-10-CM | POA: Diagnosis present

## 2018-09-18 DIAGNOSIS — N390 Urinary tract infection, site not specified: Secondary | ICD-10-CM | POA: Diagnosis not present

## 2018-09-18 DIAGNOSIS — Z9483 Pancreas transplant status: Secondary | ICD-10-CM | POA: Insufficient documentation

## 2018-09-18 DIAGNOSIS — D631 Anemia in chronic kidney disease: Secondary | ICD-10-CM | POA: Diagnosis not present

## 2018-09-18 DIAGNOSIS — Z940 Kidney transplant status: Secondary | ICD-10-CM | POA: Insufficient documentation

## 2018-09-18 DIAGNOSIS — E559 Vitamin D deficiency, unspecified: Secondary | ICD-10-CM | POA: Diagnosis not present

## 2018-09-18 DIAGNOSIS — E1122 Type 2 diabetes mellitus with diabetic chronic kidney disease: Secondary | ICD-10-CM | POA: Diagnosis not present

## 2018-09-18 DIAGNOSIS — B259 Cytomegaloviral disease, unspecified: Secondary | ICD-10-CM | POA: Diagnosis not present

## 2018-09-18 DIAGNOSIS — Z789 Other specified health status: Secondary | ICD-10-CM | POA: Diagnosis not present

## 2018-09-18 LAB — BASIC METABOLIC PANEL WITH GFR
Anion gap: 7 (ref 5–15)
BUN: 29 mg/dL — ABNORMAL HIGH (ref 8–23)
CO2: 26 mmol/L (ref 22–32)
Calcium: 9.9 mg/dL (ref 8.9–10.3)
Chloride: 104 mmol/L (ref 98–111)
Creatinine, Ser: 1.47 mg/dL — ABNORMAL HIGH (ref 0.61–1.24)
GFR calc Af Amer: 53 mL/min — ABNORMAL LOW
GFR calc non Af Amer: 46 mL/min — ABNORMAL LOW
Glucose, Bld: 159 mg/dL — ABNORMAL HIGH (ref 70–99)
Potassium: 4.6 mmol/L (ref 3.5–5.1)
Sodium: 137 mmol/L (ref 135–145)

## 2018-09-18 LAB — CBC WITH DIFFERENTIAL/PLATELET
Abs Immature Granulocytes: 0.01 K/uL (ref 0.00–0.07)
Basophils Absolute: 0 K/uL (ref 0.0–0.1)
Basophils Relative: 1 %
Eosinophils Absolute: 0 K/uL (ref 0.0–0.5)
Eosinophils Relative: 1 %
HCT: 43.2 % (ref 39.0–52.0)
Hemoglobin: 13.6 g/dL (ref 13.0–17.0)
Immature Granulocytes: 0 %
Lymphocytes Relative: 26 %
Lymphs Abs: 1 K/uL (ref 0.7–4.0)
MCH: 26.7 pg (ref 26.0–34.0)
MCHC: 31.5 g/dL (ref 30.0–36.0)
MCV: 84.9 fL (ref 80.0–100.0)
Monocytes Absolute: 0.7 K/uL (ref 0.1–1.0)
Monocytes Relative: 17 %
Neutro Abs: 2.2 K/uL (ref 1.7–7.7)
Neutrophils Relative %: 55 %
Platelets: 159 K/uL (ref 150–400)
RBC: 5.09 MIL/uL (ref 4.22–5.81)
RDW: 12.7 % (ref 11.5–15.5)
WBC: 3.9 K/uL — ABNORMAL LOW (ref 4.0–10.5)
nRBC: 0 % (ref 0.0–0.2)

## 2018-09-18 LAB — PHOSPHORUS: Phosphorus: 3.2 mg/dL (ref 2.5–4.6)

## 2018-09-18 LAB — MAGNESIUM: Magnesium: 1.8 mg/dL (ref 1.7–2.4)

## 2018-10-20 ENCOUNTER — Other Ambulatory Visit
Admission: RE | Admit: 2018-10-20 | Discharge: 2018-10-20 | Disposition: A | Discharge status: Home / Self Care | Payer: Medicare Other | Source: Ambulatory Visit | Attending: Nephrology | Admitting: Nephrology

## 2018-10-20 DIAGNOSIS — Z9483 Pancreas transplant status: Secondary | ICD-10-CM | POA: Diagnosis not present

## 2018-10-20 DIAGNOSIS — B259 Cytomegaloviral disease, unspecified: Secondary | ICD-10-CM | POA: Insufficient documentation

## 2018-10-20 DIAGNOSIS — E559 Vitamin D deficiency, unspecified: Secondary | ICD-10-CM | POA: Diagnosis not present

## 2018-10-20 DIAGNOSIS — Z09 Encounter for follow-up examination after completed treatment for conditions other than malignant neoplasm: Secondary | ICD-10-CM | POA: Diagnosis not present

## 2018-10-20 DIAGNOSIS — Z114 Encounter for screening for human immunodeficiency virus [HIV]: Secondary | ICD-10-CM | POA: Insufficient documentation

## 2018-10-20 DIAGNOSIS — Z79899 Other long term (current) drug therapy: Secondary | ICD-10-CM | POA: Insufficient documentation

## 2018-10-20 DIAGNOSIS — Z789 Other specified health status: Secondary | ICD-10-CM | POA: Insufficient documentation

## 2018-10-20 DIAGNOSIS — N189 Chronic kidney disease, unspecified: Secondary | ICD-10-CM | POA: Diagnosis not present

## 2018-10-20 DIAGNOSIS — Z940 Kidney transplant status: Secondary | ICD-10-CM | POA: Insufficient documentation

## 2018-10-20 DIAGNOSIS — D631 Anemia in chronic kidney disease: Secondary | ICD-10-CM | POA: Diagnosis not present

## 2018-10-20 DIAGNOSIS — D899 Disorder involving the immune mechanism, unspecified: Secondary | ICD-10-CM | POA: Diagnosis present

## 2018-10-20 DIAGNOSIS — N390 Urinary tract infection, site not specified: Secondary | ICD-10-CM | POA: Insufficient documentation

## 2018-10-20 DIAGNOSIS — E1129 Type 2 diabetes mellitus with other diabetic kidney complication: Secondary | ICD-10-CM | POA: Diagnosis not present

## 2018-10-20 LAB — CBC WITH DIFFERENTIAL/PLATELET
Abs Immature Granulocytes: 0.01 10*3/uL (ref 0.00–0.07)
Basophils Absolute: 0 10*3/uL (ref 0.0–0.1)
Basophils Relative: 0 %
Eosinophils Absolute: 0 10*3/uL (ref 0.0–0.5)
Eosinophils Relative: 0 %
HEMATOCRIT: 41 % (ref 39.0–52.0)
HEMOGLOBIN: 12.6 g/dL — AB (ref 13.0–17.0)
IMMATURE GRANULOCYTES: 0 %
LYMPHS ABS: 1.3 10*3/uL (ref 0.7–4.0)
Lymphocytes Relative: 26 %
MCH: 27 pg (ref 26.0–34.0)
MCHC: 30.7 g/dL (ref 30.0–36.0)
MCV: 87.8 fL (ref 80.0–100.0)
MONO ABS: 0.7 10*3/uL (ref 0.1–1.0)
Monocytes Relative: 14 %
Neutro Abs: 2.9 10*3/uL (ref 1.7–7.7)
Neutrophils Relative %: 60 %
Platelets: 183 10*3/uL (ref 150–400)
RBC: 4.67 MIL/uL (ref 4.22–5.81)
RDW: 13.2 % (ref 11.5–15.5)
WBC: 4.9 10*3/uL (ref 4.0–10.5)
nRBC: 0 % (ref 0.0–0.2)

## 2018-10-20 LAB — PHOSPHORUS: Phosphorus: 3.3 mg/dL (ref 2.5–4.6)

## 2018-10-20 LAB — BASIC METABOLIC PANEL
Anion gap: 6 (ref 5–15)
BUN: 24 mg/dL — AB (ref 8–23)
CALCIUM: 10.1 mg/dL (ref 8.9–10.3)
CO2: 26 mmol/L (ref 22–32)
CREATININE: 1.2 mg/dL (ref 0.61–1.24)
Chloride: 107 mmol/L (ref 98–111)
GFR calc Af Amer: >60 mL/min (ref >60)
GFR calc non Af Amer: 60 mL/min — ABNORMAL LOW (ref >60)
Glucose, Bld: 87 mg/dL (ref 70–99)
Potassium: 4 mmol/L (ref 3.5–5.1)
Sodium: 139 mmol/L (ref 135–145)

## 2018-10-20 LAB — MAGNESIUM: MAGNESIUM: 1.7 mg/dL (ref 1.7–2.4)

## 2018-11-06 ENCOUNTER — Other Ambulatory Visit
Admission: RE | Admit: 2018-11-06 | Discharge: 2018-11-06 | Disposition: A | Discharge status: Home / Self Care | Payer: Medicare Other | Source: Ambulatory Visit | Attending: Nephrology | Admitting: Nephrology

## 2018-11-06 DIAGNOSIS — Z940 Kidney transplant status: Secondary | ICD-10-CM | POA: Diagnosis not present

## 2018-11-06 DIAGNOSIS — E559 Vitamin D deficiency, unspecified: Secondary | ICD-10-CM | POA: Insufficient documentation

## 2018-11-06 DIAGNOSIS — Z9483 Pancreas transplant status: Secondary | ICD-10-CM | POA: Diagnosis not present

## 2018-11-06 DIAGNOSIS — Z114 Encounter for screening for human immunodeficiency virus [HIV]: Secondary | ICD-10-CM | POA: Insufficient documentation

## 2018-11-06 DIAGNOSIS — Z09 Encounter for follow-up examination after completed treatment for conditions other than malignant neoplasm: Secondary | ICD-10-CM | POA: Diagnosis not present

## 2018-11-06 DIAGNOSIS — Z79899 Other long term (current) drug therapy: Secondary | ICD-10-CM | POA: Diagnosis not present

## 2018-11-06 DIAGNOSIS — D899 Disorder involving the immune mechanism, unspecified: Secondary | ICD-10-CM | POA: Diagnosis present

## 2018-11-06 DIAGNOSIS — N189 Chronic kidney disease, unspecified: Secondary | ICD-10-CM | POA: Insufficient documentation

## 2018-11-06 DIAGNOSIS — B259 Cytomegaloviral disease, unspecified: Secondary | ICD-10-CM | POA: Diagnosis not present

## 2018-11-06 DIAGNOSIS — D631 Anemia in chronic kidney disease: Secondary | ICD-10-CM | POA: Insufficient documentation

## 2018-11-06 DIAGNOSIS — T8610 Unspecified complication of kidney transplant: Secondary | ICD-10-CM | POA: Diagnosis not present

## 2018-11-06 DIAGNOSIS — N390 Urinary tract infection, site not specified: Secondary | ICD-10-CM | POA: Insufficient documentation

## 2018-11-06 DIAGNOSIS — E1129 Type 2 diabetes mellitus with other diabetic kidney complication: Secondary | ICD-10-CM | POA: Insufficient documentation

## 2018-11-06 DIAGNOSIS — Z789 Other specified health status: Secondary | ICD-10-CM | POA: Diagnosis not present

## 2018-11-06 LAB — CBC WITH DIFFERENTIAL/PLATELET
ABS IMMATURE GRANULOCYTES: 0.01 10*3/uL (ref 0.00–0.07)
BASOS PCT: 1 %
Basophils Absolute: 0 10*3/uL (ref 0.0–0.1)
EOS ABS: 0 10*3/uL (ref 0.0–0.5)
EOS PCT: 1 %
HCT: 40.9 % (ref 39.0–52.0)
HEMOGLOBIN: 12.7 g/dL — AB (ref 13.0–17.0)
Immature Granulocytes: 0 %
LYMPHS ABS: 1.3 10*3/uL (ref 0.7–4.0)
Lymphocytes Relative: 30 %
MCH: 26.8 pg (ref 26.0–34.0)
MCHC: 31.1 g/dL (ref 30.0–36.0)
MCV: 86.3 fL (ref 80.0–100.0)
Monocytes Absolute: 0.8 10*3/uL (ref 0.1–1.0)
Monocytes Relative: 17 %
NEUTROS PCT: 51 %
NRBC: 0 % (ref 0.0–0.2)
Neutro Abs: 2.3 10*3/uL (ref 1.7–7.7)
Platelets: 199 10*3/uL (ref 150–400)
RBC: 4.74 MIL/uL (ref 4.22–5.81)
RDW: 13 % (ref 11.5–15.5)
WBC: 4.4 10*3/uL (ref 4.0–10.5)

## 2018-11-06 LAB — BASIC METABOLIC PANEL
ANION GAP: 6 (ref 5–15)
BUN: 29 mg/dL — AB (ref 8–23)
CHLORIDE: 103 mmol/L (ref 98–111)
CO2: 27 mmol/L (ref 22–32)
Calcium: 9.8 mg/dL (ref 8.9–10.3)
Creatinine, Ser: 1.5 mg/dL — ABNORMAL HIGH (ref 0.61–1.24)
GFR calc Af Amer: 53 mL/min — ABNORMAL LOW (ref >60)
GFR, EST NON AFRICAN AMERICAN: 46 mL/min — AB (ref >60)
Glucose, Bld: 129 mg/dL — ABNORMAL HIGH (ref 70–99)
Potassium: 4.4 mmol/L (ref 3.5–5.1)
Sodium: 136 mmol/L (ref 135–145)

## 2018-11-06 LAB — PHOSPHORUS: PHOSPHORUS: 3.4 mg/dL (ref 2.5–4.6)

## 2018-11-06 LAB — MAGNESIUM: Magnesium: 1.6 mg/dL — ABNORMAL LOW (ref 1.7–2.4)

## 2018-12-02 ENCOUNTER — Other Ambulatory Visit
Admission: RE | Admit: 2018-12-02 | Discharge: 2018-12-02 | Disposition: A | Discharge status: Home / Self Care | Payer: Medicare Other | Source: Ambulatory Visit | Attending: Nephrology | Admitting: Nephrology

## 2018-12-02 DIAGNOSIS — Z9483 Pancreas transplant status: Secondary | ICD-10-CM | POA: Insufficient documentation

## 2018-12-02 DIAGNOSIS — Z114 Encounter for screening for human immunodeficiency virus [HIV]: Secondary | ICD-10-CM | POA: Diagnosis not present

## 2018-12-02 DIAGNOSIS — Z79899 Other long term (current) drug therapy: Secondary | ICD-10-CM | POA: Insufficient documentation

## 2018-12-02 DIAGNOSIS — B259 Cytomegaloviral disease, unspecified: Secondary | ICD-10-CM | POA: Diagnosis not present

## 2018-12-02 DIAGNOSIS — N390 Urinary tract infection, site not specified: Secondary | ICD-10-CM | POA: Insufficient documentation

## 2018-12-02 DIAGNOSIS — Z789 Other specified health status: Secondary | ICD-10-CM | POA: Diagnosis not present

## 2018-12-02 DIAGNOSIS — D631 Anemia in chronic kidney disease: Secondary | ICD-10-CM | POA: Insufficient documentation

## 2018-12-02 DIAGNOSIS — Z09 Encounter for follow-up examination after completed treatment for conditions other than malignant neoplasm: Secondary | ICD-10-CM | POA: Diagnosis not present

## 2018-12-02 DIAGNOSIS — E559 Vitamin D deficiency, unspecified: Secondary | ICD-10-CM | POA: Diagnosis not present

## 2018-12-02 DIAGNOSIS — Z940 Kidney transplant status: Secondary | ICD-10-CM | POA: Insufficient documentation

## 2018-12-02 DIAGNOSIS — E1129 Type 2 diabetes mellitus with other diabetic kidney complication: Secondary | ICD-10-CM | POA: Diagnosis not present

## 2018-12-02 DIAGNOSIS — D899 Disorder involving the immune mechanism, unspecified: Secondary | ICD-10-CM | POA: Diagnosis present

## 2018-12-02 LAB — LIPID PANEL
Cholesterol: 118 mg/dL (ref 0–200)
HDL: 66 mg/dL (ref >40)
LDL Cholesterol: 39 mg/dL (ref 0–99)
Total CHOL/HDL Ratio: 1.8 RATIO
Triglycerides: 64 mg/dL (ref <150)
VLDL: 13 mg/dL (ref 0–40)

## 2018-12-02 LAB — CBC WITH DIFFERENTIAL/PLATELET
Abs Immature Granulocytes: 0.02 10*3/uL (ref 0.00–0.07)
Basophils Absolute: 0 10*3/uL (ref 0.0–0.1)
Basophils Relative: 0 %
Eosinophils Absolute: 0.1 10*3/uL (ref 0.0–0.5)
Eosinophils Relative: 2 %
HCT: 41.2 % (ref 39.0–52.0)
Hemoglobin: 12.7 g/dL — ABNORMAL LOW (ref 13.0–17.0)
Immature Granulocytes: 0 %
LYMPHS ABS: 1.4 10*3/uL (ref 0.7–4.0)
Lymphocytes Relative: 30 %
MCH: 26.8 pg (ref 26.0–34.0)
MCHC: 30.8 g/dL (ref 30.0–36.0)
MCV: 86.9 fL (ref 80.0–100.0)
Monocytes Absolute: 0.7 10*3/uL (ref 0.1–1.0)
Monocytes Relative: 16 %
Neutro Abs: 2.3 10*3/uL (ref 1.7–7.7)
Neutrophils Relative %: 52 %
PLATELETS: 174 10*3/uL (ref 150–400)
RBC: 4.74 MIL/uL (ref 4.22–5.81)
RDW: 13 % (ref 11.5–15.5)
WBC: 4.5 10*3/uL (ref 4.0–10.5)
nRBC: 0 % (ref 0.0–0.2)

## 2018-12-02 LAB — ALBUMIN: Albumin: 4.2 g/dL (ref 3.5–5.0)

## 2018-12-02 LAB — BASIC METABOLIC PANEL
Anion gap: 6 (ref 5–15)
BUN: 27 mg/dL — ABNORMAL HIGH (ref 8–23)
CO2: 27 mmol/L (ref 22–32)
Calcium: 10 mg/dL (ref 8.9–10.3)
Chloride: 104 mmol/L (ref 98–111)
Creatinine, Ser: 1.4 mg/dL — ABNORMAL HIGH (ref 0.61–1.24)
GFR calc Af Amer: 57 mL/min — ABNORMAL LOW (ref >60)
GFR calc non Af Amer: 49 mL/min — ABNORMAL LOW (ref >60)
Glucose, Bld: 138 mg/dL — ABNORMAL HIGH (ref 70–99)
POTASSIUM: 4.4 mmol/L (ref 3.5–5.1)
Sodium: 137 mmol/L (ref 135–145)

## 2018-12-02 LAB — BILIRUBIN, TOTAL: Total Bilirubin: 0.9 mg/dL (ref 0.3–1.2)

## 2018-12-02 LAB — MAGNESIUM: Magnesium: 1.8 mg/dL (ref 1.7–2.4)

## 2018-12-02 LAB — ALKALINE PHOSPHATASE: Alkaline Phosphatase: 49 U/L (ref 38–126)

## 2018-12-02 LAB — AST: AST: 19 U/L (ref 15–41)

## 2018-12-02 LAB — ALT: ALT: 17 U/L (ref 0–44)

## 2018-12-02 LAB — PHOSPHORUS: Phosphorus: 3.3 mg/dL (ref 2.5–4.6)

## 2018-12-03 LAB — GAMMA GT: GGT: 22 U/L (ref 7–50)

## 2019-01-12 ENCOUNTER — Other Ambulatory Visit
Admission: RE | Admit: 2019-01-12 | Discharge: 2019-01-12 | Disposition: A | Discharge status: Home / Self Care | Payer: Medicare Other | Source: Ambulatory Visit | Attending: Nephrology | Admitting: Nephrology

## 2019-01-12 ENCOUNTER — Other Ambulatory Visit: Payer: Self-pay

## 2019-01-12 DIAGNOSIS — Z79899 Other long term (current) drug therapy: Secondary | ICD-10-CM | POA: Insufficient documentation

## 2019-01-12 DIAGNOSIS — E559 Vitamin D deficiency, unspecified: Secondary | ICD-10-CM | POA: Insufficient documentation

## 2019-01-12 DIAGNOSIS — N390 Urinary tract infection, site not specified: Secondary | ICD-10-CM | POA: Diagnosis not present

## 2019-01-12 DIAGNOSIS — B259 Cytomegaloviral disease, unspecified: Secondary | ICD-10-CM | POA: Diagnosis not present

## 2019-01-12 DIAGNOSIS — D631 Anemia in chronic kidney disease: Secondary | ICD-10-CM | POA: Insufficient documentation

## 2019-01-12 DIAGNOSIS — Z114 Encounter for screening for human immunodeficiency virus [HIV]: Secondary | ICD-10-CM | POA: Diagnosis not present

## 2019-01-12 DIAGNOSIS — N189 Chronic kidney disease, unspecified: Secondary | ICD-10-CM | POA: Insufficient documentation

## 2019-01-12 DIAGNOSIS — Z940 Kidney transplant status: Secondary | ICD-10-CM | POA: Insufficient documentation

## 2019-01-12 DIAGNOSIS — D899 Disorder involving the immune mechanism, unspecified: Secondary | ICD-10-CM | POA: Insufficient documentation

## 2019-01-12 DIAGNOSIS — E1129 Type 2 diabetes mellitus with other diabetic kidney complication: Secondary | ICD-10-CM | POA: Diagnosis not present

## 2019-01-12 DIAGNOSIS — Z9483 Pancreas transplant status: Secondary | ICD-10-CM | POA: Diagnosis not present

## 2019-01-12 DIAGNOSIS — Z09 Encounter for follow-up examination after completed treatment for conditions other than malignant neoplasm: Secondary | ICD-10-CM | POA: Diagnosis not present

## 2019-01-12 DIAGNOSIS — Z789 Other specified health status: Secondary | ICD-10-CM | POA: Insufficient documentation

## 2019-01-12 DIAGNOSIS — T8610 Unspecified complication of kidney transplant: Secondary | ICD-10-CM | POA: Diagnosis not present

## 2019-01-12 LAB — CBC WITH DIFFERENTIAL/PLATELET
Abs Immature Granulocytes: 0.02 10*3/uL (ref 0.00–0.07)
Basophils Absolute: 0 10*3/uL (ref 0.0–0.1)
Basophils Relative: 1 %
Eosinophils Absolute: 0.1 10*3/uL (ref 0.0–0.5)
Eosinophils Relative: 1 %
HCT: 42.1 % (ref 39.0–52.0)
HEMOGLOBIN: 13.1 g/dL (ref 13.0–17.0)
Immature Granulocytes: 0 %
Lymphocytes Relative: 26 %
Lymphs Abs: 1.3 10*3/uL (ref 0.7–4.0)
MCH: 26.9 pg (ref 26.0–34.0)
MCHC: 31.1 g/dL (ref 30.0–36.0)
MCV: 86.4 fL (ref 80.0–100.0)
Monocytes Absolute: 0.8 10*3/uL (ref 0.1–1.0)
Monocytes Relative: 16 %
Neutro Abs: 2.8 10*3/uL (ref 1.7–7.7)
Neutrophils Relative %: 56 %
Platelets: 188 10*3/uL (ref 150–400)
RBC: 4.87 MIL/uL (ref 4.22–5.81)
RDW: 13 % (ref 11.5–15.5)
WBC: 5 10*3/uL (ref 4.0–10.5)
nRBC: 0 % (ref 0.0–0.2)

## 2019-01-12 LAB — BASIC METABOLIC PANEL
Anion gap: 9 (ref 5–15)
BUN: 36 mg/dL — ABNORMAL HIGH (ref 8–23)
CALCIUM: 10.2 mg/dL (ref 8.9–10.3)
CO2: 28 mmol/L (ref 22–32)
Chloride: 100 mmol/L (ref 98–111)
Creatinine, Ser: 1.47 mg/dL — ABNORMAL HIGH (ref 0.61–1.24)
GFR calc Af Amer: 54 mL/min — ABNORMAL LOW (ref >60)
GFR calc non Af Amer: 47 mL/min — ABNORMAL LOW (ref >60)
Glucose, Bld: 135 mg/dL — ABNORMAL HIGH (ref 70–99)
Potassium: 4.3 mmol/L (ref 3.5–5.1)
Sodium: 137 mmol/L (ref 135–145)

## 2019-01-12 LAB — PHOSPHORUS: Phosphorus: 3.4 mg/dL (ref 2.5–4.6)

## 2019-01-12 LAB — MAGNESIUM: Magnesium: 1.9 mg/dL (ref 1.7–2.4)

## 2019-02-17 ENCOUNTER — Other Ambulatory Visit
Admission: RE | Admit: 2019-02-17 | Discharge: 2019-02-17 | Disposition: A | Discharge status: Home / Self Care | Payer: Medicare Other | Source: Ambulatory Visit | Attending: Nephrology | Admitting: Nephrology

## 2019-02-17 DIAGNOSIS — Z940 Kidney transplant status: Secondary | ICD-10-CM | POA: Diagnosis not present

## 2019-02-17 DIAGNOSIS — Z79899 Other long term (current) drug therapy: Secondary | ICD-10-CM | POA: Diagnosis not present

## 2019-02-17 DIAGNOSIS — Z789 Other specified health status: Secondary | ICD-10-CM | POA: Diagnosis not present

## 2019-02-17 DIAGNOSIS — E1129 Type 2 diabetes mellitus with other diabetic kidney complication: Secondary | ICD-10-CM | POA: Insufficient documentation

## 2019-02-17 DIAGNOSIS — D899 Disorder involving the immune mechanism, unspecified: Secondary | ICD-10-CM | POA: Diagnosis present

## 2019-02-17 DIAGNOSIS — E559 Vitamin D deficiency, unspecified: Secondary | ICD-10-CM | POA: Insufficient documentation

## 2019-02-17 DIAGNOSIS — D631 Anemia in chronic kidney disease: Secondary | ICD-10-CM | POA: Insufficient documentation

## 2019-02-17 DIAGNOSIS — N390 Urinary tract infection, site not specified: Secondary | ICD-10-CM | POA: Insufficient documentation

## 2019-02-17 DIAGNOSIS — Z09 Encounter for follow-up examination after completed treatment for conditions other than malignant neoplasm: Secondary | ICD-10-CM | POA: Insufficient documentation

## 2019-02-17 DIAGNOSIS — T8610 Unspecified complication of kidney transplant: Secondary | ICD-10-CM | POA: Insufficient documentation

## 2019-02-17 DIAGNOSIS — Z9483 Pancreas transplant status: Secondary | ICD-10-CM | POA: Insufficient documentation

## 2019-02-17 DIAGNOSIS — N189 Chronic kidney disease, unspecified: Secondary | ICD-10-CM | POA: Diagnosis not present

## 2019-02-17 DIAGNOSIS — B259 Cytomegaloviral disease, unspecified: Secondary | ICD-10-CM | POA: Insufficient documentation

## 2019-02-17 DIAGNOSIS — Z114 Encounter for screening for human immunodeficiency virus [HIV]: Secondary | ICD-10-CM | POA: Insufficient documentation

## 2019-02-17 LAB — CBC WITH DIFFERENTIAL/PLATELET
Abs Immature Granulocytes: 0.01 10*3/uL (ref 0.00–0.07)
Basophils Absolute: 0 10*3/uL (ref 0.0–0.1)
Basophils Relative: 1 %
Eosinophils Absolute: 0 10*3/uL (ref 0.0–0.5)
Eosinophils Relative: 1 %
HCT: 41.3 % (ref 39.0–52.0)
Hemoglobin: 13.2 g/dL (ref 13.0–17.0)
Immature Granulocytes: 0 %
Lymphocytes Relative: 28 %
Lymphs Abs: 1.2 10*3/uL (ref 0.7–4.0)
MCH: 26.8 pg (ref 26.0–34.0)
MCHC: 32 g/dL (ref 30.0–36.0)
MCV: 83.8 fL (ref 80.0–100.0)
Monocytes Absolute: 0.6 10*3/uL (ref 0.1–1.0)
Monocytes Relative: 13 %
Neutro Abs: 2.3 10*3/uL (ref 1.7–7.7)
Neutrophils Relative %: 57 %
Platelets: 188 10*3/uL (ref 150–400)
RBC: 4.93 MIL/uL (ref 4.22–5.81)
RDW: 13 % (ref 11.5–15.5)
WBC: 4.1 10*3/uL (ref 4.0–10.5)
nRBC: 0 % (ref 0.0–0.2)

## 2019-02-17 LAB — BASIC METABOLIC PANEL
Anion gap: 11 (ref 5–15)
BUN: 25 mg/dL — ABNORMAL HIGH (ref 8–23)
CO2: 24 mmol/L (ref 22–32)
Calcium: 10.4 mg/dL — ABNORMAL HIGH (ref 8.9–10.3)
Chloride: 104 mmol/L (ref 98–111)
Creatinine, Ser: 1.27 mg/dL — ABNORMAL HIGH (ref 0.61–1.24)
GFR calc Af Amer: >60 mL/min (ref >60)
GFR calc non Af Amer: 56 mL/min — ABNORMAL LOW (ref >60)
Glucose, Bld: 133 mg/dL — ABNORMAL HIGH (ref 70–99)
Potassium: 4.3 mmol/L (ref 3.5–5.1)
Sodium: 139 mmol/L (ref 135–145)

## 2019-02-17 LAB — MAGNESIUM: Magnesium: 1.8 mg/dL (ref 1.7–2.4)

## 2019-02-17 LAB — PHOSPHORUS: Phosphorus: 3.3 mg/dL (ref 2.5–4.6)

## 2019-03-26 ENCOUNTER — Other Ambulatory Visit
Admission: RE | Admit: 2019-03-26 | Discharge: 2019-03-26 | Disposition: A | Discharge status: Home / Self Care | Payer: Medicare Other | Source: Ambulatory Visit | Attending: Nephrology | Admitting: Nephrology

## 2019-03-26 DIAGNOSIS — Z114 Encounter for screening for human immunodeficiency virus [HIV]: Secondary | ICD-10-CM | POA: Insufficient documentation

## 2019-03-26 DIAGNOSIS — N189 Chronic kidney disease, unspecified: Secondary | ICD-10-CM | POA: Insufficient documentation

## 2019-03-26 DIAGNOSIS — Z79899 Other long term (current) drug therapy: Secondary | ICD-10-CM | POA: Diagnosis not present

## 2019-03-26 DIAGNOSIS — E1129 Type 2 diabetes mellitus with other diabetic kidney complication: Secondary | ICD-10-CM | POA: Diagnosis not present

## 2019-03-26 DIAGNOSIS — T8610 Unspecified complication of kidney transplant: Secondary | ICD-10-CM | POA: Insufficient documentation

## 2019-03-26 DIAGNOSIS — Z789 Other specified health status: Secondary | ICD-10-CM | POA: Diagnosis not present

## 2019-03-26 DIAGNOSIS — B259 Cytomegaloviral disease, unspecified: Secondary | ICD-10-CM | POA: Diagnosis not present

## 2019-03-26 DIAGNOSIS — Z09 Encounter for follow-up examination after completed treatment for conditions other than malignant neoplasm: Secondary | ICD-10-CM | POA: Insufficient documentation

## 2019-03-26 DIAGNOSIS — N390 Urinary tract infection, site not specified: Secondary | ICD-10-CM | POA: Diagnosis not present

## 2019-03-26 DIAGNOSIS — E559 Vitamin D deficiency, unspecified: Secondary | ICD-10-CM | POA: Diagnosis not present

## 2019-03-26 DIAGNOSIS — Z9483 Pancreas transplant status: Secondary | ICD-10-CM | POA: Diagnosis not present

## 2019-03-26 DIAGNOSIS — D631 Anemia in chronic kidney disease: Secondary | ICD-10-CM | POA: Diagnosis not present

## 2019-03-26 DIAGNOSIS — Z940 Kidney transplant status: Secondary | ICD-10-CM | POA: Diagnosis not present

## 2019-03-26 DIAGNOSIS — D899 Disorder involving the immune mechanism, unspecified: Secondary | ICD-10-CM | POA: Insufficient documentation

## 2019-03-26 LAB — BASIC METABOLIC PANEL
Anion gap: 8 (ref 5–15)
BUN: 26 mg/dL — ABNORMAL HIGH (ref 8–23)
CO2: 26 mmol/L (ref 22–32)
Calcium: 10 mg/dL (ref 8.9–10.3)
Chloride: 104 mmol/L (ref 98–111)
Creatinine, Ser: 1.34 mg/dL — ABNORMAL HIGH (ref 0.61–1.24)
GFR calc Af Amer: >60 mL/min (ref >60)
GFR calc non Af Amer: 52 mL/min — ABNORMAL LOW (ref >60)
Glucose, Bld: 176 mg/dL — ABNORMAL HIGH (ref 70–99)
Potassium: 4.7 mmol/L (ref 3.5–5.1)
Sodium: 138 mmol/L (ref 135–145)

## 2019-03-26 LAB — CBC WITH DIFFERENTIAL/PLATELET
Abs Immature Granulocytes: 0.01 10*3/uL (ref 0.00–0.07)
Basophils Absolute: 0 10*3/uL (ref 0.0–0.1)
Basophils Relative: 1 %
Eosinophils Absolute: 0 10*3/uL (ref 0.0–0.5)
Eosinophils Relative: 1 %
HCT: 42.2 % (ref 39.0–52.0)
Hemoglobin: 13.2 g/dL (ref 13.0–17.0)
Immature Granulocytes: 0 %
Lymphocytes Relative: 31 %
Lymphs Abs: 1.1 10*3/uL (ref 0.7–4.0)
MCH: 26.3 pg (ref 26.0–34.0)
MCHC: 31.3 g/dL (ref 30.0–36.0)
MCV: 84.2 fL (ref 80.0–100.0)
Monocytes Absolute: 0.6 10*3/uL (ref 0.1–1.0)
Monocytes Relative: 17 %
Neutro Abs: 1.7 10*3/uL (ref 1.7–7.7)
Neutrophils Relative %: 50 %
Platelets: 192 10*3/uL (ref 150–400)
RBC: 5.01 MIL/uL (ref 4.22–5.81)
RDW: 13.1 % (ref 11.5–15.5)
WBC: 3.5 10*3/uL — ABNORMAL LOW (ref 4.0–10.5)
nRBC: 0 % (ref 0.0–0.2)

## 2019-03-26 LAB — PHOSPHORUS: Phosphorus: 2.8 mg/dL (ref 2.5–4.6)

## 2019-03-26 LAB — MAGNESIUM: Magnesium: 2 mg/dL (ref 1.7–2.4)

## 2019-04-17 ENCOUNTER — Other Ambulatory Visit: Payer: Self-pay | Admitting: Sports Medicine

## 2019-04-17 DIAGNOSIS — M5136 Other intervertebral disc degeneration, lumbar region: Secondary | ICD-10-CM

## 2019-04-17 DIAGNOSIS — G8929 Other chronic pain: Secondary | ICD-10-CM

## 2019-04-17 DIAGNOSIS — M4310 Spondylolisthesis, site unspecified: Secondary | ICD-10-CM

## 2019-04-17 DIAGNOSIS — M47816 Spondylosis without myelopathy or radiculopathy, lumbar region: Secondary | ICD-10-CM

## 2019-04-17 DIAGNOSIS — R2681 Unsteadiness on feet: Secondary | ICD-10-CM

## 2019-05-04 ENCOUNTER — Other Ambulatory Visit
Admission: RE | Admit: 2019-05-04 | Discharge: 2019-05-04 | Disposition: A | Discharge status: Home / Self Care | Payer: Medicare Other | Source: Ambulatory Visit | Attending: Nephrology | Admitting: Nephrology

## 2019-05-04 DIAGNOSIS — Z79899 Other long term (current) drug therapy: Secondary | ICD-10-CM | POA: Insufficient documentation

## 2019-05-04 DIAGNOSIS — Z9483 Pancreas transplant status: Secondary | ICD-10-CM | POA: Insufficient documentation

## 2019-05-04 DIAGNOSIS — Z114 Encounter for screening for human immunodeficiency virus [HIV]: Secondary | ICD-10-CM | POA: Diagnosis not present

## 2019-05-04 DIAGNOSIS — Z09 Encounter for follow-up examination after completed treatment for conditions other than malignant neoplasm: Secondary | ICD-10-CM | POA: Diagnosis not present

## 2019-05-04 DIAGNOSIS — Z940 Kidney transplant status: Secondary | ICD-10-CM | POA: Diagnosis not present

## 2019-05-04 DIAGNOSIS — T8610 Unspecified complication of kidney transplant: Secondary | ICD-10-CM | POA: Insufficient documentation

## 2019-05-04 DIAGNOSIS — N189 Chronic kidney disease, unspecified: Secondary | ICD-10-CM | POA: Insufficient documentation

## 2019-05-04 DIAGNOSIS — N390 Urinary tract infection, site not specified: Secondary | ICD-10-CM | POA: Insufficient documentation

## 2019-05-04 DIAGNOSIS — D899 Disorder involving the immune mechanism, unspecified: Secondary | ICD-10-CM | POA: Insufficient documentation

## 2019-05-04 DIAGNOSIS — E559 Vitamin D deficiency, unspecified: Secondary | ICD-10-CM | POA: Diagnosis not present

## 2019-05-04 DIAGNOSIS — E1129 Type 2 diabetes mellitus with other diabetic kidney complication: Secondary | ICD-10-CM | POA: Diagnosis not present

## 2019-05-04 DIAGNOSIS — B259 Cytomegaloviral disease, unspecified: Secondary | ICD-10-CM | POA: Diagnosis not present

## 2019-05-04 DIAGNOSIS — D631 Anemia in chronic kidney disease: Secondary | ICD-10-CM | POA: Diagnosis not present

## 2019-05-04 DIAGNOSIS — Z789 Other specified health status: Secondary | ICD-10-CM | POA: Insufficient documentation

## 2019-05-04 LAB — CBC WITH DIFFERENTIAL/PLATELET
Abs Immature Granulocytes: 0.04 10*3/uL (ref 0.00–0.07)
Basophils Absolute: 0 10*3/uL (ref 0.0–0.1)
Basophils Relative: 0 %
Eosinophils Absolute: 0.1 10*3/uL (ref 0.0–0.5)
Eosinophils Relative: 1 %
HCT: 37 % — ABNORMAL LOW (ref 39.0–52.0)
Hemoglobin: 11.8 g/dL — ABNORMAL LOW (ref 13.0–17.0)
Immature Granulocytes: 1 %
Lymphocytes Relative: 18 %
Lymphs Abs: 0.9 10*3/uL (ref 0.7–4.0)
MCH: 26.3 pg (ref 26.0–34.0)
MCHC: 31.9 g/dL (ref 30.0–36.0)
MCV: 82.4 fL (ref 80.0–100.0)
Monocytes Absolute: 0.7 10*3/uL (ref 0.1–1.0)
Monocytes Relative: 13 %
Neutro Abs: 3.4 10*3/uL (ref 1.7–7.7)
Neutrophils Relative %: 67 %
Platelets: 392 10*3/uL (ref 150–400)
RBC: 4.49 MIL/uL (ref 4.22–5.81)
RDW: 12.6 % (ref 11.5–15.5)
WBC: 5.1 10*3/uL (ref 4.0–10.5)
nRBC: 0 % (ref 0.0–0.2)

## 2019-05-04 LAB — BASIC METABOLIC PANEL
Anion gap: 10 (ref 5–15)
BUN: 27 mg/dL — ABNORMAL HIGH (ref 8–23)
CO2: 26 mmol/L (ref 22–32)
Calcium: 9.9 mg/dL (ref 8.9–10.3)
Chloride: 101 mmol/L (ref 98–111)
Creatinine, Ser: 1.41 mg/dL — ABNORMAL HIGH (ref 0.61–1.24)
GFR calc Af Amer: 57 mL/min — ABNORMAL LOW (ref >60)
GFR calc non Af Amer: 49 mL/min — ABNORMAL LOW (ref >60)
Glucose, Bld: 178 mg/dL — ABNORMAL HIGH (ref 70–99)
Potassium: 4.5 mmol/L (ref 3.5–5.1)
Sodium: 137 mmol/L (ref 135–145)

## 2019-05-04 LAB — PHOSPHORUS: Phosphorus: 2.5 mg/dL (ref 2.5–4.6)

## 2019-05-04 LAB — MAGNESIUM: Magnesium: 2 mg/dL (ref 1.7–2.4)

## 2019-05-11 ENCOUNTER — Other Ambulatory Visit: Payer: Self-pay

## 2019-05-11 ENCOUNTER — Ambulatory Visit
Admission: RE | Admit: 2019-05-11 | Discharge: 2019-05-11 | Disposition: A | Discharge status: Home / Self Care | Payer: Medicare Other | Source: Ambulatory Visit | Attending: Sports Medicine | Admitting: Sports Medicine

## 2019-05-11 DIAGNOSIS — R2681 Unsteadiness on feet: Secondary | ICD-10-CM | POA: Insufficient documentation

## 2019-05-11 DIAGNOSIS — G8929 Other chronic pain: Secondary | ICD-10-CM | POA: Diagnosis present

## 2019-05-11 DIAGNOSIS — M5441 Lumbago with sciatica, right side: Secondary | ICD-10-CM | POA: Insufficient documentation

## 2019-05-11 DIAGNOSIS — M5442 Lumbago with sciatica, left side: Secondary | ICD-10-CM | POA: Diagnosis not present

## 2019-05-11 DIAGNOSIS — M47816 Spondylosis without myelopathy or radiculopathy, lumbar region: Secondary | ICD-10-CM | POA: Diagnosis present

## 2019-05-11 DIAGNOSIS — M4310 Spondylolisthesis, site unspecified: Secondary | ICD-10-CM | POA: Diagnosis present

## 2019-05-11 DIAGNOSIS — M5136 Other intervertebral disc degeneration, lumbar region: Secondary | ICD-10-CM | POA: Diagnosis present

## 2019-05-14 ENCOUNTER — Other Ambulatory Visit: Payer: Self-pay

## 2019-05-14 DIAGNOSIS — Z20822 Contact with and (suspected) exposure to covid-19: Secondary | ICD-10-CM

## 2019-05-18 LAB — NOVEL CORONAVIRUS, NAA: SARS-CoV-2, NAA: NOT DETECTED

## 2019-06-10 ENCOUNTER — Other Ambulatory Visit
Admission: RE | Admit: 2019-06-10 | Discharge: 2019-06-10 | Disposition: A | Discharge status: Home / Self Care | Payer: Medicare Other | Source: Ambulatory Visit | Attending: Nephrology | Admitting: Nephrology

## 2019-06-10 DIAGNOSIS — D899 Disorder involving the immune mechanism, unspecified: Secondary | ICD-10-CM | POA: Diagnosis present

## 2019-06-10 DIAGNOSIS — E1122 Type 2 diabetes mellitus with diabetic chronic kidney disease: Secondary | ICD-10-CM | POA: Insufficient documentation

## 2019-06-10 DIAGNOSIS — Z79899 Other long term (current) drug therapy: Secondary | ICD-10-CM | POA: Diagnosis not present

## 2019-06-10 DIAGNOSIS — N189 Chronic kidney disease, unspecified: Secondary | ICD-10-CM | POA: Insufficient documentation

## 2019-06-10 DIAGNOSIS — Z940 Kidney transplant status: Secondary | ICD-10-CM | POA: Insufficient documentation

## 2019-06-10 DIAGNOSIS — N390 Urinary tract infection, site not specified: Secondary | ICD-10-CM | POA: Insufficient documentation

## 2019-06-10 DIAGNOSIS — D631 Anemia in chronic kidney disease: Secondary | ICD-10-CM | POA: Diagnosis not present

## 2019-06-10 DIAGNOSIS — Z789 Other specified health status: Secondary | ICD-10-CM | POA: Insufficient documentation

## 2019-06-10 DIAGNOSIS — B259 Cytomegaloviral disease, unspecified: Secondary | ICD-10-CM | POA: Diagnosis not present

## 2019-06-10 DIAGNOSIS — Z9483 Pancreas transplant status: Secondary | ICD-10-CM | POA: Diagnosis not present

## 2019-06-10 DIAGNOSIS — E1129 Type 2 diabetes mellitus with other diabetic kidney complication: Secondary | ICD-10-CM | POA: Insufficient documentation

## 2019-06-10 DIAGNOSIS — E559 Vitamin D deficiency, unspecified: Secondary | ICD-10-CM | POA: Diagnosis not present

## 2019-06-10 DIAGNOSIS — Z114 Encounter for screening for human immunodeficiency virus [HIV]: Secondary | ICD-10-CM | POA: Insufficient documentation

## 2019-06-10 LAB — CBC WITH DIFFERENTIAL/PLATELET
Abs Immature Granulocytes: 0.01 10*3/uL (ref 0.00–0.07)
Basophils Absolute: 0 10*3/uL (ref 0.0–0.1)
Basophils Relative: 1 %
Eosinophils Absolute: 0.1 10*3/uL (ref 0.0–0.5)
Eosinophils Relative: 2 %
HCT: 40.2 % (ref 39.0–52.0)
Hemoglobin: 12.7 g/dL — ABNORMAL LOW (ref 13.0–17.0)
Immature Granulocytes: 0 %
Lymphocytes Relative: 25 %
Lymphs Abs: 1.2 10*3/uL (ref 0.7–4.0)
MCH: 26.1 pg (ref 26.0–34.0)
MCHC: 31.6 g/dL (ref 30.0–36.0)
MCV: 82.5 fL (ref 80.0–100.0)
Monocytes Absolute: 0.7 10*3/uL (ref 0.1–1.0)
Monocytes Relative: 16 %
Neutro Abs: 2.6 10*3/uL (ref 1.7–7.7)
Neutrophils Relative %: 56 %
Platelets: 220 10*3/uL (ref 150–400)
RBC: 4.87 MIL/uL (ref 4.22–5.81)
RDW: 14 % (ref 11.5–15.5)
WBC: 4.6 10*3/uL (ref 4.0–10.5)
nRBC: 0 % (ref 0.0–0.2)

## 2019-06-10 LAB — BASIC METABOLIC PANEL
Anion gap: 9 (ref 5–15)
BUN: 24 mg/dL — ABNORMAL HIGH (ref 8–23)
CO2: 27 mmol/L (ref 22–32)
Calcium: 9.9 mg/dL (ref 8.9–10.3)
Chloride: 103 mmol/L (ref 98–111)
Creatinine, Ser: 1.39 mg/dL — ABNORMAL HIGH (ref 0.61–1.24)
GFR calc Af Amer: 58 mL/min — ABNORMAL LOW (ref >60)
GFR calc non Af Amer: 50 mL/min — ABNORMAL LOW (ref >60)
Glucose, Bld: 129 mg/dL — ABNORMAL HIGH (ref 70–99)
Potassium: 3.9 mmol/L (ref 3.5–5.1)
Sodium: 139 mmol/L (ref 135–145)

## 2019-06-10 LAB — PHOSPHORUS: Phosphorus: 3.6 mg/dL (ref 2.5–4.6)

## 2019-06-10 LAB — MAGNESIUM: Magnesium: 1.6 mg/dL — ABNORMAL LOW (ref 1.7–2.4)

## 2019-07-05 ENCOUNTER — Emergency Department
Admission: EM | Admit: 2019-07-05 | Discharge: 2019-07-05 | Disposition: A | Discharge status: Home / Self Care | Payer: Medicare Other | Attending: Emergency Medicine | Admitting: Emergency Medicine

## 2019-07-05 ENCOUNTER — Encounter: Payer: Self-pay | Admitting: Emergency Medicine

## 2019-07-05 ENCOUNTER — Emergency Department: Payer: Medicare Other

## 2019-07-05 ENCOUNTER — Other Ambulatory Visit: Payer: Self-pay

## 2019-07-05 DIAGNOSIS — Z91040 Latex allergy status: Secondary | ICD-10-CM | POA: Insufficient documentation

## 2019-07-05 DIAGNOSIS — Z7982 Long term (current) use of aspirin: Secondary | ICD-10-CM | POA: Diagnosis not present

## 2019-07-05 DIAGNOSIS — M19011 Primary osteoarthritis, right shoulder: Secondary | ICD-10-CM | POA: Insufficient documentation

## 2019-07-05 DIAGNOSIS — M25511 Pain in right shoulder: Secondary | ICD-10-CM | POA: Diagnosis present

## 2019-07-05 DIAGNOSIS — M7531 Calcific tendinitis of right shoulder: Secondary | ICD-10-CM | POA: Diagnosis not present

## 2019-07-05 DIAGNOSIS — I10 Essential (primary) hypertension: Secondary | ICD-10-CM | POA: Insufficient documentation

## 2019-07-05 DIAGNOSIS — E119 Type 2 diabetes mellitus without complications: Secondary | ICD-10-CM | POA: Diagnosis not present

## 2019-07-05 DIAGNOSIS — Z940 Kidney transplant status: Secondary | ICD-10-CM | POA: Insufficient documentation

## 2019-07-05 DIAGNOSIS — Z8673 Personal history of transient ischemic attack (TIA), and cerebral infarction without residual deficits: Secondary | ICD-10-CM | POA: Insufficient documentation

## 2019-07-05 DIAGNOSIS — Z7902 Long term (current) use of antithrombotics/antiplatelets: Secondary | ICD-10-CM | POA: Insufficient documentation

## 2019-07-05 DIAGNOSIS — M19019 Primary osteoarthritis, unspecified shoulder: Secondary | ICD-10-CM

## 2019-07-05 DIAGNOSIS — Z79899 Other long term (current) drug therapy: Secondary | ICD-10-CM | POA: Diagnosis not present

## 2019-07-05 DIAGNOSIS — Z794 Long term (current) use of insulin: Secondary | ICD-10-CM | POA: Diagnosis not present

## 2019-07-05 MED ORDER — PREDNISONE 20 MG PO TABS
60.0000 mg | ORAL_TABLET | Freq: Once | ORAL | Status: AC
  Administered 2019-07-05: 60 mg via ORAL
  Filled 2019-07-05: qty 3

## 2019-07-05 MED ORDER — PREDNISONE 10 MG PO TABS: 10.0000 mg | ORAL_TABLET | Freq: Every day | ORAL | 0 refills | Status: DC

## 2019-07-05 MED ORDER — HYDROCODONE-ACETAMINOPHEN 5-325 MG PO TABS: 1.0000 | ORAL_TABLET | ORAL | 0 refills | Status: DC | PRN

## 2019-07-05 NOTE — ED Notes (Signed)
FIRST NURSE NOTE:  Pt here with c/o right shoulder pain x 1.5 months, pt states he has hx of rotator cuff problem. States pain worse over the past 2 days.

## 2019-07-05 NOTE — ED Provider Notes (Signed)
[facility] Emergency Department Provider Note  ____________________________________________  Time seen: Approximately 4:08 PM  I have reviewed the triage vital signs and the nursing notes.   HISTORY  Chief Complaint Shoulder Pain    HPI James Hicks is a 73 y.o. male who presents the emergency department complaining of right shoulder pain.  Patient reports that he has been experiencing increasing pain to the right shoulder over the past several weeks.  Patient states that he has a history of rotator cuff injury that was surgically repaired "several years ago."  Patient has not had any residual problems until he started to develop pain to the lateral aspect of the shoulder.  No numbness or tingling in the right hand.  No direct trauma to the shoulder.  Patient denies any neck pain, headache, chest pain, shortness of breath, abdominal pain, nausea or vomiting.  Patient does not take any medication  for his shoulder.  No other complaints at this time.  Patient has a history of cholelithiasis, CVA, diabetes, diabetic neuropathy, hyperlipidemia, hypertension.  Patient had a history of chronic renal failure with kidney transplant.  No complaints with chronic medical problems.        Past Medical History:  Diagnosis Date  . Cholelithiasis   . Chronic renal failure    kidney transplant   . Colon polyps   . CVA (cerebral infarction)   . Degenerative joint disease   . Diabetes mellitus without complication ([place])   . Diabetic neuropathy ([place])   . Hydronephrosis of right kidney   . Hyperlipidemia   . Hypertension   . Renal cell carcinoma ([place])   . Stroke [facility])     There are no active problems to display for this patient.   Past Surgical History:  Procedure Laterality Date  . CATARACT EXTRACTION    . CHOLECYSTECTOMY    . COLONOSCOPY    . COLONOSCOPY WITH PROPOFOL N/A 05/28/2018   Procedure: COLONOSCOPY WITH PROPOFOL;  Surgeon: Toledo, Benay Pike, MD;   Location: [facility] ENDOSCOPY;  Service: Gastroenterology;  Laterality: N/A;  . KIDNEY TRANSPLANT    . KNEE ARTHROSCOPY    . NEPHRECTOMY    . TONSILLECTOMY    . WISDOM TOOTH EXTRACTION      Prior to Admission medications   Medication Sig Start Date End Date Taking? Authorizing Provider  allopurinol (ZYLOPRIM) 100 MG tablet Take 100 mg by mouth daily.    [provider]  aspirin EC 81 MG tablet Take 81 mg by mouth daily.    [provider]  atorvastatin (LIPITOR) 10 MG tablet Take 10 mg by mouth daily.    [provider]  calcium-vitamin D (OSCAL WITH D) 500-200 MG-UNIT tablet Take 1 tablet by mouth 2 (two) times daily.    [provider]  clopidogrel (PLAVIX) 75 MG tablet Take 75 mg by mouth daily.    [provider]  colchicine 0.6 MG tablet Take 0.6 mg by mouth daily.    [provider]  enalapril (VASOTEC) 20 MG tablet Take 20 mg by mouth daily.    [provider]  furosemide (LASIX) 20 MG tablet Take 20 mg by mouth 2 (two) times daily.    [provider]  hydrochlorothiazide (HYDRODIURIL) 50 MG tablet Take 50 mg by mouth daily.    [provider]  HYDROcodone-acetaminophen (NORCO/VICODIN) 5-325 MG tablet Take 1 tablet by mouth every 4 (four) hours as needed for moderate pain. 07/05/19   , Charline Bills, PA-C  insulin  glargine (LANTUS) 100 UNIT/ML injection Inject 15 Units into the skin at bedtime.     [provider]  insulin lispro (HUMALOG) 100 UNIT/ML injection Inject 10 Units into the skin 3 (three) times daily with meals. Per sliding scale    [provider]  metoprolol succinate (TOPROL-XL) 25 MG 24 hr tablet Take 25 mg by mouth daily. 07/31/18   [provider]  mycophenolate (CELLCEPT) 500 MG tablet Take 1,000 mg by mouth 2 (two) times daily.     [provider]  NIFEdipine (PROCARDIA XL/ADALAT-CC) 60 MG 24 hr tablet Take 60 mg by mouth 2 (two) times daily.     [provider]  predniSONE (DELTASONE) 10 MG tablet Take 1 tablet (10 mg total) by mouth daily. 07/05/19   , Charline Bills, PA-C  tacrolimus (PROGRAF) 1 MG capsule Take 5 mg by mouth 2 (two) times daily.    [provider]  timolol (TIMOPTIC) 0.25 % ophthalmic solution Place 1 drop into both eyes 2 (two) times daily.     [provider]    Allergies Synvisc [hylan g-f 20], Latex, and Other  Family History  Problem Relation Age of Onset  . Colon cancer Mother   . Diabetes Mother   . Breast cancer Sister     Social History Social History   Tobacco Use  . Smoking status: Never Smoker  . Smokeless tobacco: Never Used  Substance Use Topics  . Alcohol use: Not Currently  . Drug use: Not Currently     Review of Systems  Constitutional: No fever/chills Eyes: No visual changes. No discharge ENT: No upper respiratory complaints. Cardiovascular: no chest pain. Respiratory: no cough. No SOB. Gastrointestinal: No abdominal pain.  No nausea, no vomiting.   Musculoskeletal: Positive for increasing right shoulder pain Skin: Negative for rash, abrasions, lacerations, ecchymosis. Neurological: Negative for headaches, focal weakness or numbness. 10-point ROS otherwise negative.  ____________________________________________   PHYSICAL EXAM:  VITAL SIGNS: ED Triage Vitals  Enc Vitals Group     BP 07/05/19 1539 (!) 162/68     Pulse Rate 07/05/19 1539 78     Resp 07/05/19 1539 18     Temp 07/05/19 1539 98.5 F (36.9 C)     Temp Source 07/05/19 1539 Oral     SpO2 07/05/19 1539 100 %     Weight 07/05/19 1540 190 lb (86.2 kg)     Height 07/05/19 1540 6\' 3"  (1.905 m)     Head Circumference --      Peak Flow --      Pain Score 07/05/19 1542 7     Pain Loc --      Pain Edu? --      Excl. in [place]? --      Constitutional: Alert and oriented. Well appearing and in no acute distress. Eyes: Conjunctivae are normal. PERRL. EOMI. Head: Atraumatic. ENT:       Ears:       Nose: No congestion/rhinnorhea.      Mouth/Throat: Mucous membranes are moist.  Neck: No stridor.  No cervical spine tenderness to palpation.  Cardiovascular: Normal rate, regular rhythm. Normal S1 and S2.  Good peripheral circulation. Respiratory: Normal respiratory effort without tachypnea or retractions. Lungs CTAB. Good air entry to the bases with no decreased or absent breath sounds. Musculoskeletal: Full range of motion to all extremities. No gross deformities appreciated.  Visualization of the right shoulder reveals no gross signs of edema, erythema.  Patient has good range of motion  but is somewhat limited due to pain.  Patient is nontender to palpation along the scapula and clavicle.  Patient is very tender to palpation along the proximal biceps and deltoid muscles.  No palpable abnormality or deficit.  Examination of the elbow and wrist is unremarkable.  Radial pulse intact distally.  Sensation intact distally. Neurologic:  Normal speech and language. No gross focal neurologic deficits are appreciated.  Skin:  Skin is warm, dry and intact. No rash noted. Psychiatric: Mood and affect are normal. Speech and behavior are normal. Patient exhibits appropriate insight and judgement.   ____________________________________________   LABS (all labs ordered are listed, but only abnormal results are displayed)  Labs Reviewed - No data to display ____________________________________________  EKG   ____________________________________________  RADIOLOGY I personally viewed and evaluated these images as part of my medical decision making, as well as reviewing the written report by the radiologist.  Dg Shoulder Right  Result Date: 07/05/2019 CLINICAL DATA:  Worsening right shoulder pain. EXAM: RIGHT SHOULDER - 2+ VIEW COMPARISON:  None. FINDINGS: There is no evidence of fracture or dislocation. Advanced osteoarthritic changes at the glenohumeral joint with joint space  narrowing subchondral sclerosis and osteophytosis. Mild osteoarthritic changes at the acromioclavicular joint. Well corticated calcific fragments superior to the humeral head may represent sequela of prior trauma or calcific tendinitis of the rotator cuff. 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 Electronically Signed   By: Fidela Salisbury M.D.   On: 07/05/2019 16:40    ____________________________________________    PROCEDURES  Procedure(s) performed:    Procedures    Medications  predniSONE (DELTASONE) tablet 60 mg (has no administration in time range)     ____________________________________________   INITIAL IMPRESSION / ASSESSMENT AND PLAN / ED COURSE  Pertinent labs & imaging results that were available during my care of the patient were reviewed by me and considered in my medical decision making (see chart for details).  Review of the [place] CSRS was performed in accordance of the [place] prior to dispensing any controlled drugs.           Patient's diagnosis is consistent with shoulder pain with findings consistent with glenohumeral arthritis and calcific tendinitis.  Patient presented to the emergency department with pain to the right shoulder for the past 3 weeks.  History of rotator cuff repair in the distant past.  Exam was otherwise reassuring.  Patient is given prednisone taper, limited pain medication for symptom relief.  Follow back up with orthopedics if patient continues to have ongoing pain..  Patient is given ED precautions to return to the ED for any worsening or new symptoms.     ____________________________________________  FINAL CLINICAL IMPRESSION(S) / ED DIAGNOSES  Final diagnoses:  Acute pain of right shoulder  Glenohumeral arthritis  Calcific tendinitis of right shoulder      NEW MEDICATIONS STARTED DURING THIS VISIT:  ED Discharge Orders         Ordered    predniSONE (DELTASONE) 10 MG tablet  Daily     Note to Pharmacy: Take 6 pills x 2 days, 5 pills x 2 days, 4 pills x 2 days, 3 pills x 2 days, 2 pills x 2 days, and 1 pill x 2 days   07/05/19 1744    HYDROcodone-acetaminophen (NORCO/VICODIN) 5-325 MG tablet  Every 4 hours PRN     07/05/19 1744              This chart was dictated using voice recognition software/Dragon. Despite  best efforts to proofread, errors can occur which can change the meaning. Any change was purely unintentional.    Darletta Moll, PA-C 07/05/19 1747    Nance Pear, MD 07/05/19 2132

## 2019-07-05 NOTE — ED Triage Notes (Signed)
Presents with right shoulder pain  States pain started about 3 weeks ago   Denies any injury hx of rotator cuff problems   States last night pain became worse

## 2019-07-10 ENCOUNTER — Other Ambulatory Visit
Admission: RE | Admit: 2019-07-10 | Discharge: 2019-07-10 | Disposition: A | Discharge status: Home / Self Care | Payer: Medicare Other | Source: Ambulatory Visit | Attending: Nephrology | Admitting: Nephrology

## 2019-07-10 DIAGNOSIS — E1129 Type 2 diabetes mellitus with other diabetic kidney complication: Secondary | ICD-10-CM | POA: Insufficient documentation

## 2019-07-10 DIAGNOSIS — Z09 Encounter for follow-up examination after completed treatment for conditions other than malignant neoplasm: Secondary | ICD-10-CM | POA: Insufficient documentation

## 2019-07-10 DIAGNOSIS — Z789 Other specified health status: Secondary | ICD-10-CM | POA: Diagnosis not present

## 2019-07-10 DIAGNOSIS — D899 Disorder involving the immune mechanism, unspecified: Secondary | ICD-10-CM | POA: Diagnosis present

## 2019-07-10 DIAGNOSIS — Z940 Kidney transplant status: Secondary | ICD-10-CM | POA: Diagnosis not present

## 2019-07-10 DIAGNOSIS — B259 Cytomegaloviral disease, unspecified: Secondary | ICD-10-CM | POA: Diagnosis not present

## 2019-07-10 DIAGNOSIS — N390 Urinary tract infection, site not specified: Secondary | ICD-10-CM | POA: Insufficient documentation

## 2019-07-10 DIAGNOSIS — D631 Anemia in chronic kidney disease: Secondary | ICD-10-CM | POA: Diagnosis not present

## 2019-07-10 DIAGNOSIS — Z9483 Pancreas transplant status: Secondary | ICD-10-CM | POA: Diagnosis not present

## 2019-07-10 DIAGNOSIS — E559 Vitamin D deficiency, unspecified: Secondary | ICD-10-CM | POA: Insufficient documentation

## 2019-07-10 DIAGNOSIS — Z79899 Other long term (current) drug therapy: Secondary | ICD-10-CM | POA: Diagnosis not present

## 2019-07-10 DIAGNOSIS — Z114 Encounter for screening for human immunodeficiency virus [HIV]: Secondary | ICD-10-CM | POA: Insufficient documentation

## 2019-07-10 LAB — PHOSPHORUS: Phosphorus: 2.6 mg/dL (ref 2.5–4.6)

## 2019-07-10 LAB — LIPID PANEL
Cholesterol: 136 mg/dL (ref 0–200)
HDL: 76 mg/dL (ref >40)
LDL Cholesterol: 50 mg/dL (ref 0–99)
Total CHOL/HDL Ratio: 1.8 RATIO
Triglycerides: 48 mg/dL (ref <150)
VLDL: 10 mg/dL (ref 0–40)

## 2019-07-10 LAB — CBC WITH DIFFERENTIAL/PLATELET
Abs Immature Granulocytes: 0.03 10*3/uL (ref 0.00–0.07)
Basophils Absolute: 0 10*3/uL (ref 0.0–0.1)
Basophils Relative: 0 %
Eosinophils Absolute: 0 10*3/uL (ref 0.0–0.5)
Eosinophils Relative: 0 %
HCT: 37.7 % — ABNORMAL LOW (ref 39.0–52.0)
Hemoglobin: 11.9 g/dL — ABNORMAL LOW (ref 13.0–17.0)
Immature Granulocytes: 1 %
Lymphocytes Relative: 20 %
Lymphs Abs: 1.3 10*3/uL (ref 0.7–4.0)
MCH: 25.6 pg — ABNORMAL LOW (ref 26.0–34.0)
MCHC: 31.6 g/dL (ref 30.0–36.0)
MCV: 81.3 fL (ref 80.0–100.0)
Monocytes Absolute: 0.9 10*3/uL (ref 0.1–1.0)
Monocytes Relative: 14 %
Neutro Abs: 4.1 10*3/uL (ref 1.7–7.7)
Neutrophils Relative %: 65 %
Platelets: 204 10*3/uL (ref 150–400)
RBC: 4.64 MIL/uL (ref 4.22–5.81)
RDW: 13.6 % (ref 11.5–15.5)
WBC: 6.3 10*3/uL (ref 4.0–10.5)
nRBC: 0 % (ref 0.0–0.2)

## 2019-07-10 LAB — BASIC METABOLIC PANEL
Anion gap: 10 (ref 5–15)
BUN: 26 mg/dL — ABNORMAL HIGH (ref 8–23)
CO2: 26 mmol/L (ref 22–32)
Calcium: 10 mg/dL (ref 8.9–10.3)
Chloride: 104 mmol/L (ref 98–111)
Creatinine, Ser: 0.97 mg/dL (ref 0.61–1.24)
GFR calc Af Amer: >60 mL/min (ref >60)
GFR calc non Af Amer: >60 mL/min (ref >60)
Glucose, Bld: 193 mg/dL — ABNORMAL HIGH (ref 70–99)
Potassium: 3.8 mmol/L (ref 3.5–5.1)
Sodium: 140 mmol/L (ref 135–145)

## 2019-07-10 LAB — MAGNESIUM: Magnesium: 2.1 mg/dL (ref 1.7–2.4)

## 2019-07-10 LAB — GAMMA GT: GGT: 24 U/L (ref 7–50)

## 2019-07-10 LAB — ALBUMIN: Albumin: 4 g/dL (ref 3.5–5.0)

## 2019-07-10 LAB — AST: AST: 13 U/L — ABNORMAL LOW (ref 15–41)

## 2019-07-10 LAB — ALKALINE PHOSPHATASE: Alkaline Phosphatase: 64 U/L (ref 38–126)

## 2019-07-10 LAB — ALT: ALT: 12 U/L (ref 0–44)

## 2019-07-10 LAB — BILIRUBIN, TOTAL: Total Bilirubin: 0.6 mg/dL (ref 0.3–1.2)

## 2019-07-13 LAB — TACROLIMUS LEVEL: Tacrolimus (FK506) - LabCorp: 4 ng/mL (ref 2.0–20.0)

## 2019-07-17 ENCOUNTER — Other Ambulatory Visit: Payer: Self-pay | Admitting: Ophthalmology

## 2019-07-17 DIAGNOSIS — H518 Other specified disorders of binocular movement: Secondary | ICD-10-CM

## 2019-07-17 DIAGNOSIS — H532 Diplopia: Secondary | ICD-10-CM

## 2019-07-29 ENCOUNTER — Other Ambulatory Visit: Payer: Self-pay

## 2019-07-29 ENCOUNTER — Ambulatory Visit
Admission: RE | Admit: 2019-07-29 | Discharge: 2019-07-29 | Disposition: A | Discharge status: Home / Self Care | Payer: Medicare Other | Source: Ambulatory Visit | Attending: Ophthalmology | Admitting: Ophthalmology

## 2019-07-29 DIAGNOSIS — H518 Other specified disorders of binocular movement: Secondary | ICD-10-CM | POA: Diagnosis present

## 2019-07-29 DIAGNOSIS — H532 Diplopia: Secondary | ICD-10-CM | POA: Diagnosis present

## 2019-07-29 MED ORDER — GADOBUTROL 1 MMOL/ML IV SOLN
8.0000 mL | Freq: Once | INTRAVENOUS | Status: AC | PRN
  Administered 2019-07-29: 17:00:00 8 mL via INTRAVENOUS

## 2019-08-06 ENCOUNTER — Emergency Department
Admission: EM | Admit: 2019-08-06 | Discharge: 2019-08-06 | Disposition: A | Discharge status: Home / Self Care | Payer: Medicare Other | Attending: Emergency Medicine | Admitting: Emergency Medicine

## 2019-08-06 ENCOUNTER — Emergency Department: Payer: Medicare Other

## 2019-08-06 ENCOUNTER — Other Ambulatory Visit: Payer: Self-pay

## 2019-08-06 ENCOUNTER — Encounter: Payer: Self-pay | Admitting: Emergency Medicine

## 2019-08-06 DIAGNOSIS — Z7982 Long term (current) use of aspirin: Secondary | ICD-10-CM | POA: Diagnosis not present

## 2019-08-06 DIAGNOSIS — N189 Chronic kidney disease, unspecified: Secondary | ICD-10-CM | POA: Insufficient documentation

## 2019-08-06 DIAGNOSIS — Z79899 Other long term (current) drug therapy: Secondary | ICD-10-CM | POA: Insufficient documentation

## 2019-08-06 DIAGNOSIS — Z940 Kidney transplant status: Secondary | ICD-10-CM | POA: Insufficient documentation

## 2019-08-06 DIAGNOSIS — N179 Acute kidney failure, unspecified: Secondary | ICD-10-CM | POA: Diagnosis not present

## 2019-08-06 DIAGNOSIS — E1140 Type 2 diabetes mellitus with diabetic neuropathy, unspecified: Secondary | ICD-10-CM | POA: Insufficient documentation

## 2019-08-06 DIAGNOSIS — E1122 Type 2 diabetes mellitus with diabetic chronic kidney disease: Secondary | ICD-10-CM | POA: Insufficient documentation

## 2019-08-06 DIAGNOSIS — Z7902 Long term (current) use of antithrombotics/antiplatelets: Secondary | ICD-10-CM | POA: Diagnosis not present

## 2019-08-06 DIAGNOSIS — I129 Hypertensive chronic kidney disease with stage 1 through stage 4 chronic kidney disease, or unspecified chronic kidney disease: Secondary | ICD-10-CM | POA: Insufficient documentation

## 2019-08-06 DIAGNOSIS — Z794 Long term (current) use of insulin: Secondary | ICD-10-CM | POA: Insufficient documentation

## 2019-08-06 DIAGNOSIS — I491 Atrial premature depolarization: Secondary | ICD-10-CM | POA: Diagnosis not present

## 2019-08-06 DIAGNOSIS — M25561 Pain in right knee: Secondary | ICD-10-CM

## 2019-08-06 LAB — CBC WITH DIFFERENTIAL/PLATELET
Abs Immature Granulocytes: 0.02 10*3/uL (ref 0.00–0.07)
Basophils Absolute: 0 10*3/uL (ref 0.0–0.1)
Basophils Relative: 0 %
Eosinophils Absolute: 0 10*3/uL (ref 0.0–0.5)
Eosinophils Relative: 0 %
HCT: 35.9 % — ABNORMAL LOW (ref 39.0–52.0)
Hemoglobin: 11.6 g/dL — ABNORMAL LOW (ref 13.0–17.0)
Immature Granulocytes: 0 %
Lymphocytes Relative: 8 %
Lymphs Abs: 0.7 10*3/uL (ref 0.7–4.0)
MCH: 26.2 pg (ref 26.0–34.0)
MCHC: 32.3 g/dL (ref 30.0–36.0)
MCV: 81 fL (ref 80.0–100.0)
Monocytes Absolute: 1.3 10*3/uL — ABNORMAL HIGH (ref 0.1–1.0)
Monocytes Relative: 14 %
Neutro Abs: 6.9 10*3/uL (ref 1.7–7.7)
Neutrophils Relative %: 78 %
Platelets: 254 10*3/uL (ref 150–400)
RBC: 4.43 MIL/uL (ref 4.22–5.81)
RDW: 13.6 % (ref 11.5–15.5)
WBC: 8.9 10*3/uL (ref 4.0–10.5)
nRBC: 0 % (ref 0.0–0.2)

## 2019-08-06 LAB — TROPONIN I (HIGH SENSITIVITY)
Troponin I (High Sensitivity): 31 ng/L — ABNORMAL HIGH (ref <18)
Troponin I (High Sensitivity): 30 ng/L — ABNORMAL HIGH (ref <18)

## 2019-08-06 LAB — COMPREHENSIVE METABOLIC PANEL
ALT: 22 U/L (ref 0–44)
AST: 28 U/L (ref 15–41)
Albumin: 3.5 g/dL (ref 3.5–5.0)
Alkaline Phosphatase: 72 U/L (ref 38–126)
Anion gap: 13 (ref 5–15)
BUN: 31 mg/dL — ABNORMAL HIGH (ref 8–23)
CO2: 27 mmol/L (ref 22–32)
Calcium: 9.5 mg/dL (ref 8.9–10.3)
Chloride: 98 mmol/L (ref 98–111)
Creatinine, Ser: 1.33 mg/dL — ABNORMAL HIGH (ref 0.61–1.24)
GFR calc Af Amer: >60 mL/min (ref >60)
GFR calc non Af Amer: 52 mL/min — ABNORMAL LOW (ref >60)
Glucose, Bld: 127 mg/dL — ABNORMAL HIGH (ref 70–99)
Potassium: 4.3 mmol/L (ref 3.5–5.1)
Sodium: 138 mmol/L (ref 135–145)
Total Bilirubin: 1.2 mg/dL (ref 0.3–1.2)
Total Protein: 7.8 g/dL (ref 6.5–8.1)

## 2019-08-06 LAB — BUN: BUN: 31 mg/dL — ABNORMAL HIGH (ref 8–23)

## 2019-08-06 LAB — CREATININE, SERUM
Creatinine, Ser: 1.33 mg/dL — ABNORMAL HIGH (ref 0.61–1.24)
GFR calc Af Amer: >60 mL/min (ref >60)
GFR calc non Af Amer: 52 mL/min — ABNORMAL LOW (ref >60)

## 2019-08-06 LAB — URIC ACID: Uric Acid, Serum: 7.7 mg/dL (ref 3.7–8.6)

## 2019-08-06 MED ORDER — TRAMADOL HCL 50 MG PO TABS
50.0000 mg | ORAL_TABLET | Freq: Once | ORAL | Status: AC
  Administered 2019-08-06: 50 mg via ORAL
  Filled 2019-08-06: qty 1

## 2019-08-06 MED ORDER — ONDANSETRON HCL 4 MG/2ML IJ SOLN
4.0000 mg | Freq: Once | INTRAMUSCULAR | Status: AC
  Administered 2019-08-06: 14:00:00 4 mg via INTRAVENOUS
  Filled 2019-08-06: qty 2

## 2019-08-06 MED ORDER — TRAMADOL HCL 50 MG PO TABS: 50.0000 mg | ORAL_TABLET | Freq: Four times a day (QID) | ORAL | 0 refills | Status: DC | PRN

## 2019-08-06 MED ORDER — SODIUM CHLORIDE 0.9 % IV BOLUS
500.0000 mL | Freq: Once | INTRAVENOUS | Status: AC
  Administered 2019-08-06: 500 mL via INTRAVENOUS

## 2019-08-06 MED ORDER — MORPHINE SULFATE (PF) 4 MG/ML IV SOLN
4.0000 mg | Freq: Once | INTRAVENOUS | Status: AC
  Administered 2019-08-06: 4 mg via INTRAVENOUS
  Filled 2019-08-06: qty 1

## 2019-08-06 NOTE — ED Triage Notes (Signed)
Pt states that he began to have knee pain on Monday. States that the pain is worse in his right knee, but present in both. States that he gets injections and physical therapy for his knees due to arthritis. States that he had to stop going to PT because of COVID and his pain has gradually increased since.

## 2019-08-06 NOTE — ED Provider Notes (Signed)
[facility] Emergency Department Provider Note  ____________________________________________   None    (approximate)  I have reviewed the triage vital signs and the nursing notes.   HISTORY  Chief Complaint Knee Pain    HPI James Hicks is a 73 y.o. male presents to the emergency department with complaints of right hip and right knee pain.  States knee pain is much worse and he can hardly bear weight on it.  Was seen here approximately 3 weeks ago given prednisone which helped a lot.  States he used to have physical therapy but since COVID has been unable to go.  He is also stating that he feels his heart fluttering a little bit.  He would like to know if he can get that checked out to.  Patient has history of cancer.  History of a kidney transplant.   He denies chest pain or shortness of breath.  He also states he has had weight loss.  His wife states he is not eating and drinking as normal.   Past Medical History:  Diagnosis Date  . Cholelithiasis   . Chronic renal failure    kidney transplant   . Colon polyps   . CVA (cerebral infarction)   . Degenerative joint disease   . Diabetes mellitus without complication ([place])   . Diabetic neuropathy ([place])   . Hydronephrosis of right kidney   . Hyperlipidemia   . Hypertension   . Renal cell carcinoma ([place])   . Stroke [facility])     There are no active problems to display for this patient.   Past Surgical History:  Procedure Laterality Date  . CATARACT EXTRACTION    . CHOLECYSTECTOMY    . COLONOSCOPY    . COLONOSCOPY WITH PROPOFOL N/A 05/28/2018   Procedure: COLONOSCOPY WITH PROPOFOL;  Surgeon: Toledo, Benay Pike, MD;  Location: [facility] ENDOSCOPY;  Service: Gastroenterology;  Laterality: N/A;  . KIDNEY TRANSPLANT    . KNEE ARTHROSCOPY    . NEPHRECTOMY    . TONSILLECTOMY    . WISDOM TOOTH EXTRACTION      Prior to Admission medications   Medication Sig Start Date End Date Taking? Authorizing Provider   allopurinol (ZYLOPRIM) 100 MG tablet Take 100 mg by mouth daily.    [provider]  aspirin EC 81 MG tablet Take 81 mg by mouth daily.    [provider]  atorvastatin (LIPITOR) 10 MG tablet Take 10 mg by mouth daily.    [provider]  calcium-vitamin D (OSCAL WITH D) 500-200 MG-UNIT tablet Take 1 tablet by mouth 2 (two) times daily.    [provider]  clopidogrel (PLAVIX) 75 MG tablet Take 75 mg by mouth daily.    [provider]  colchicine 0.6 MG tablet Take 0.6 mg by mouth daily.    [provider]  enalapril (VASOTEC) 20 MG tablet Take 20 mg by mouth daily.    [provider]  furosemide (LASIX) 20 MG tablet Take 20 mg by mouth 2 (two) times daily.    [provider]  hydrochlorothiazide (HYDRODIURIL) 50 MG tablet Take 50 mg by mouth daily.    [provider]  HYDROcodone-acetaminophen (NORCO/VICODIN) 5-325 MG tablet Take 1 tablet by mouth every 4 (four) hours as needed for moderate pain. 07/05/19   Cuthriell, Charline Bills, PA-C  insulin glargine (LANTUS) 100 UNIT/ML injection Inject 15 Units into the skin at bedtime.     [provider]  insulin lispro (HUMALOG) 100  UNIT/ML injection Inject 10 Units into the skin 3 (three) times daily with meals. Per sliding scale    [provider]  metoprolol succinate (TOPROL-XL) 25 MG 24 hr tablet Take 25 mg by mouth daily. 07/31/18   [provider]  mycophenolate (CELLCEPT) 500 MG tablet Take 1,000 mg by mouth 2 (two) times daily.     [provider]  NIFEdipine (PROCARDIA XL/ADALAT-CC) 60 MG 24 hr tablet Take 60 mg by mouth 2 (two) times daily.    [provider]  predniSONE (DELTASONE) 10 MG tablet Take 1 tablet (10 mg total) by mouth daily. 07/05/19   Cuthriell, Charline Bills, PA-C  tacrolimus (PROGRAF) 1 MG capsule Take 5 mg by mouth 2 (two) times daily.    [provider]  timolol (TIMOPTIC) 0.25 % ophthalmic solution  Place 1 drop into both eyes 2 (two) times daily.     [provider]  traMADol (ULTRAM) 50 MG tablet Take 1 tablet (50 mg total) by mouth every 6 (six) hours as needed. 08/06/19   Caryn Section Linden Dolin, PA-C    Allergies Synvisc [hylan g-f 20], Latex, and Other  Family History  Problem Relation Age of Onset  . Colon cancer Mother   . Diabetes Mother   . Breast cancer Sister     Social History Social History   Tobacco Use  . Smoking status: Never Smoker  . Smokeless tobacco: Never Used  Substance Use Topics  . Alcohol use: Not Currently  . Drug use: Not Currently    Review of Systems  Constitutional: No fever/chills Eyes: No visual changes. ENT: No sore throat. Respiratory: Denies cough Cardiovascular: States he feels like his heart is fluttering Genitourinary: Negative for dysuria. Musculoskeletal: Negative for back pain.  Positive for right hip and right knee pain Skin: Negative for rash.    ____________________________________________   PHYSICAL EXAM:  VITAL SIGNS: ED Triage Vitals  Enc Vitals Group     BP 08/06/19 1205 (!) 145/61     Pulse Rate 08/06/19 1205 97     Resp 08/06/19 1205 16     Temp 08/06/19 1205 98.8 F (37.1 C)     Temp Source 08/06/19 1205 Oral     SpO2 08/06/19 1204 99 %     Weight 08/06/19 1205 180 lb (81.6 kg)     Height 08/06/19 1205 6\' 3"  (1.905 m)     Head Circumference --      Peak Flow --      Pain Score 08/06/19 1205 8     Pain Loc --      Pain Edu? --      Excl. in [place]? --     Constitutional: Alert and oriented. Well appearing and in no acute distress.  Very thin Eyes: Conjunctivae are normal.  Head: Atraumatic. Nose: No congestion/rhinnorhea. Mouth/Throat: Mucous membranes are moist.   Neck:  supple no lymphadenopathy noted Cardiovascular: Normal rate, regular rhythm. Heart sounds are normal Respiratory: Normal respiratory effort.  No retractions, lungs c t a  GU: deferred Musculoskeletal: Decreased range of motion  of right knee, right knee is warm to touch, right hip is tender, neurovascular is intact  neurologic:  Normal speech and language.  Skin:  Skin is warm, dry and intact. No rash noted. Psychiatric: Mood and affect are normal. Speech and behavior are normal.  ____________________________________________   LABS (all labs ordered are listed, but only abnormal results are displayed)  Labs Reviewed  COMPREHENSIVE METABOLIC PANEL - Abnormal; Notable for  the following components:      Result Value   Glucose, Bld 127 (*)    BUN 31 (*)    Creatinine, Ser 1.33 (*)    GFR calc non Af Amer 52 (*)    All other components within normal limits  CBC WITH DIFFERENTIAL/PLATELET - Abnormal; Notable for the following components:   Hemoglobin 11.6 (*)    HCT 35.9 (*)    Monocytes Absolute 1.3 (*)    All other components within normal limits  BUN - Abnormal; Notable for the following components:   BUN 31 (*)    All other components within normal limits  CREATININE, SERUM - Abnormal; Notable for the following components:   Creatinine, Ser 1.33 (*)    GFR calc non Af Amer 52 (*)    All other components within normal limits  TROPONIN I (HIGH SENSITIVITY) - Abnormal; Notable for the following components:   Troponin I (High Sensitivity) 31 (*)    All other components within normal limits  TROPONIN I (HIGH SENSITIVITY) - Abnormal; Notable for the following components:   Troponin I (High Sensitivity) 30 (*)    All other components within normal limits  URIC ACID   ____________________________________________   ____________________________________________  RADIOLOGY  X-ray of pelvis does not show any acute changes X-ray of the right knee does not show any acute changes  ____________________________________________   PROCEDURES  Procedure(s) performed: Saline lock, morphine 4 mg IV, Zofran 4 mg IV, normal saline 500 mL's IV EKG shows PACs  Procedures     ____________________________________________   INITIAL IMPRESSION / ASSESSMENT AND PLAN / ED COURSE  Pertinent labs & imaging results that were available during my care of the patient were reviewed by me and considered in my medical decision making (see chart for details).   Patient 73 year old male presents emergency department complaining of his heart fluttering and right knee pain.  See HPI  Physical exam patient is very thin.  Right hip is tender, right knee is tender.  Remainder the exam is basically unremarkable  EKG shows PACs X-ray of the right knee shows arthritis and joint effusion X-ray of the pelvis does show arthritis but no acute abnormality CBC is basically normal, troponin was elevated at 31 but repeat troponin is still at 30, I feel that this is stable at this time, uric acid is normal, BUN and creatinine were elevated at 31 and 1.33, not sure if this is a decrease in his kidney function due to the dye that was run from an MRI or if he is starting to have more kidney problems.  He is to follow-up this kidney specialist at [facility].  His wife was sitting in a email as we spoke. He is to follow-up with Dr. Saralyn Pilar for his PACs. He is to drink plenty of water.  Take Tylenol for pain.  Is given tramadol for pain.  Return emergency department as needed.  States he understands will comply.  Is discharged in stable condition in the care of his wife.    James Hicks was evaluated in Emergency Department on 08/06/2019 for the symptoms described in the history of present illness. He was evaluated in the context of the global COVID-19 pandemic, which necessitated consideration that the patient might be at risk for infection with the SARS-CoV-2 virus that causes COVID-19. Institutional protocols and algorithms that pertain to the evaluation of patients at risk for COVID-19 are in a state of rapid change based on information released by regulatory bodies  including the CDC and federal and state  organizations. These policies and algorithms were followed during the patient's care in the ED.   As part of my medical decision making, I reviewed the following data within the [place] History obtained from family, Nursing notes reviewed and incorporated, Labs reviewed see above, EKG interpreted PACs, Old chart reviewed, Radiograph reviewed x-ray of the right hip and right knee do not show any acute abnormalities, Notes from prior ED visits and [place] Controlled Substance Database  ____________________________________________   FINAL CLINICAL IMPRESSION(S) / ED DIAGNOSES  Final diagnoses:  PAC (premature atrial contraction)  Acute pain of right knee  AKI (acute kidney injury) ([place])      NEW MEDICATIONS STARTED DURING THIS VISIT:  New Prescriptions   TRAMADOL (ULTRAM) 50 MG TABLET    Take 1 tablet (50 mg total) by mouth every 6 (six) hours as needed.     Note:  This document was prepared using Dragon voice recognition software and may include unintentional dictation errors.    Versie Starks, PA-C 08/06/19 1646    Carrie Mew, MD 08/06/19 [phone]

## 2019-08-06 NOTE — Discharge Instructions (Addendum)
Follow-up with your kidney specialist.  Please call her for an appointment.  Let her know that the BUN was 31 and the creatinine level is 1.33 which is an elevation from your last visit 3 weeks ago.  Also let her know that when you had your MRI that they most likely Ran a dye through your kidney.  Drink plenty of water.  Apply ice to your knee.  Use the walker to help you prevent bearing weight on your leg.  Return if needed.

## 2019-08-10 ENCOUNTER — Other Ambulatory Visit
Admission: RE | Admit: 2019-08-10 | Discharge: 2019-08-10 | Disposition: A | Discharge status: Home / Self Care | Payer: Medicare Other | Source: Ambulatory Visit | Attending: Sports Medicine | Admitting: Sports Medicine

## 2019-08-10 DIAGNOSIS — M25561 Pain in right knee: Secondary | ICD-10-CM | POA: Diagnosis present

## 2019-08-10 DIAGNOSIS — G8929 Other chronic pain: Secondary | ICD-10-CM | POA: Diagnosis not present

## 2019-08-10 DIAGNOSIS — M25461 Effusion, right knee: Secondary | ICD-10-CM | POA: Insufficient documentation

## 2019-08-10 DIAGNOSIS — M1711 Unilateral primary osteoarthritis, right knee: Secondary | ICD-10-CM | POA: Insufficient documentation

## 2019-08-10 LAB — SYNOVIAL CELL COUNT + DIFF, W/ CRYSTALS
Eosinophils-Synovial: 0 %
Lymphocytes-Synovial Fld: 4 %
Monocyte-Macrophage-Synovial Fluid: 7 %
Neutrophil, Synovial: 89 %
WBC, Synovial: 851 /mm3 — ABNORMAL HIGH (ref 0–200)

## 2019-08-25 ENCOUNTER — Other Ambulatory Visit: Payer: Self-pay | Admitting: Sports Medicine

## 2019-08-25 DIAGNOSIS — G8929 Other chronic pain: Secondary | ICD-10-CM

## 2019-08-25 DIAGNOSIS — M25461 Effusion, right knee: Secondary | ICD-10-CM

## 2019-08-25 DIAGNOSIS — M76891 Other specified enthesopathies of right lower limb, excluding foot: Secondary | ICD-10-CM

## 2019-08-25 DIAGNOSIS — M25561 Pain in right knee: Secondary | ICD-10-CM

## 2019-08-31 ENCOUNTER — Other Ambulatory Visit: Payer: Self-pay

## 2019-08-31 DIAGNOSIS — Z20822 Contact with and (suspected) exposure to covid-19: Secondary | ICD-10-CM

## 2019-09-01 LAB — NOVEL CORONAVIRUS, NAA: SARS-CoV-2, NAA: NOT DETECTED

## 2019-09-02 ENCOUNTER — Telehealth: Payer: Self-pay | Admitting: General Practice

## 2019-09-02 NOTE — Telephone Encounter (Signed)
Negative COVID results given. Patient results "NOT Detected." Caller expressed understanding. ° °

## 2019-09-06 ENCOUNTER — Ambulatory Visit
Admission: RE | Admit: 2019-09-06 | Discharge: 2019-09-06 | Disposition: A | Discharge status: Home / Self Care | Payer: Medicare Other | Source: Ambulatory Visit | Attending: Sports Medicine | Admitting: Sports Medicine

## 2019-09-06 ENCOUNTER — Other Ambulatory Visit: Payer: Self-pay

## 2019-09-06 DIAGNOSIS — M25561 Pain in right knee: Secondary | ICD-10-CM | POA: Insufficient documentation

## 2019-09-06 DIAGNOSIS — M25461 Effusion, right knee: Secondary | ICD-10-CM | POA: Insufficient documentation

## 2019-09-06 DIAGNOSIS — G8929 Other chronic pain: Secondary | ICD-10-CM | POA: Diagnosis present

## 2019-09-06 DIAGNOSIS — M76891 Other specified enthesopathies of right lower limb, excluding foot: Secondary | ICD-10-CM | POA: Insufficient documentation

## 2019-09-11 ENCOUNTER — Other Ambulatory Visit: Payer: Self-pay

## 2019-09-11 ENCOUNTER — Other Ambulatory Visit
Admission: RE | Admit: 2019-09-11 | Discharge: 2019-09-11 | Disposition: A | Discharge status: Home / Self Care | Payer: Medicare Other | Source: Ambulatory Visit | Attending: Nephrology | Admitting: Nephrology

## 2019-09-11 DIAGNOSIS — Z09 Encounter for follow-up examination after completed treatment for conditions other than malignant neoplasm: Secondary | ICD-10-CM | POA: Diagnosis not present

## 2019-09-11 DIAGNOSIS — D899 Disorder involving the immune mechanism, unspecified: Secondary | ICD-10-CM | POA: Insufficient documentation

## 2019-09-11 DIAGNOSIS — Z789 Other specified health status: Secondary | ICD-10-CM | POA: Insufficient documentation

## 2019-09-11 DIAGNOSIS — E559 Vitamin D deficiency, unspecified: Secondary | ICD-10-CM | POA: Diagnosis not present

## 2019-09-11 DIAGNOSIS — Z79899 Other long term (current) drug therapy: Secondary | ICD-10-CM | POA: Diagnosis not present

## 2019-09-11 DIAGNOSIS — D631 Anemia in chronic kidney disease: Secondary | ICD-10-CM | POA: Insufficient documentation

## 2019-09-11 DIAGNOSIS — E1129 Type 2 diabetes mellitus with other diabetic kidney complication: Secondary | ICD-10-CM | POA: Insufficient documentation

## 2019-09-11 DIAGNOSIS — B259 Cytomegaloviral disease, unspecified: Secondary | ICD-10-CM | POA: Insufficient documentation

## 2019-09-11 DIAGNOSIS — Z940 Kidney transplant status: Secondary | ICD-10-CM | POA: Insufficient documentation

## 2019-09-11 DIAGNOSIS — N390 Urinary tract infection, site not specified: Secondary | ICD-10-CM | POA: Insufficient documentation

## 2019-09-11 DIAGNOSIS — Z9483 Pancreas transplant status: Secondary | ICD-10-CM | POA: Diagnosis not present

## 2019-09-11 DIAGNOSIS — Z114 Encounter for screening for human immunodeficiency virus [HIV]: Secondary | ICD-10-CM | POA: Insufficient documentation

## 2019-09-11 LAB — BASIC METABOLIC PANEL
Anion gap: 10 (ref 5–15)
BUN: 22 mg/dL (ref 8–23)
CO2: 28 mmol/L (ref 22–32)
Calcium: 9.8 mg/dL (ref 8.9–10.3)
Chloride: 102 mmol/L (ref 98–111)
Creatinine, Ser: 0.97 mg/dL (ref 0.61–1.24)
GFR calc Af Amer: >60 mL/min (ref >60)
GFR calc non Af Amer: >60 mL/min (ref >60)
Glucose, Bld: 203 mg/dL — ABNORMAL HIGH (ref 70–99)
Potassium: 4.3 mmol/L (ref 3.5–5.1)
Sodium: 140 mmol/L (ref 135–145)

## 2019-09-11 LAB — CBC WITH DIFFERENTIAL/PLATELET
Abs Immature Granulocytes: 0.01 10*3/uL (ref 0.00–0.07)
Basophils Absolute: 0 10*3/uL (ref 0.0–0.1)
Basophils Relative: 1 %
Eosinophils Absolute: 0.1 10*3/uL (ref 0.0–0.5)
Eosinophils Relative: 1 %
HCT: 38.9 % — ABNORMAL LOW (ref 39.0–52.0)
Hemoglobin: 12.6 g/dL — ABNORMAL LOW (ref 13.0–17.0)
Immature Granulocytes: 0 %
Lymphocytes Relative: 31 %
Lymphs Abs: 1.7 10*3/uL (ref 0.7–4.0)
MCH: 25.1 pg — ABNORMAL LOW (ref 26.0–34.0)
MCHC: 32.4 g/dL (ref 30.0–36.0)
MCV: 77.6 fL — ABNORMAL LOW (ref 80.0–100.0)
Monocytes Absolute: 0.8 10*3/uL (ref 0.1–1.0)
Monocytes Relative: 13 %
Neutro Abs: 3.1 10*3/uL (ref 1.7–7.7)
Neutrophils Relative %: 54 %
Platelets: 352 10*3/uL (ref 150–400)
RBC: 5.01 MIL/uL (ref 4.22–5.81)
RDW: 14.4 % (ref 11.5–15.5)
WBC: 5.7 10*3/uL (ref 4.0–10.5)
nRBC: 0 % (ref 0.0–0.2)

## 2019-09-11 LAB — MAGNESIUM: Magnesium: 1.5 mg/dL — ABNORMAL LOW (ref 1.7–2.4)

## 2019-09-11 LAB — PHOSPHORUS: Phosphorus: 2.9 mg/dL (ref 2.5–4.6)

## 2019-10-09 ENCOUNTER — Other Ambulatory Visit: Payer: Self-pay

## 2019-10-09 ENCOUNTER — Other Ambulatory Visit
Admission: RE | Admit: 2019-10-09 | Discharge: 2019-10-09 | Disposition: A | Discharge status: Home / Self Care | Payer: Medicare Other | Attending: Ophthalmology | Admitting: Ophthalmology

## 2019-10-09 DIAGNOSIS — G4700 Insomnia, unspecified: Secondary | ICD-10-CM | POA: Insufficient documentation

## 2019-10-09 DIAGNOSIS — G7000 Myasthenia gravis without (acute) exacerbation: Secondary | ICD-10-CM | POA: Diagnosis present

## 2019-10-09 LAB — CBC WITH DIFFERENTIAL/PLATELET
Abs Immature Granulocytes: 0.01 10*3/uL (ref 0.00–0.07)
Basophils Absolute: 0 10*3/uL (ref 0.0–0.1)
Basophils Relative: 1 %
Eosinophils Absolute: 0.1 10*3/uL (ref 0.0–0.5)
Eosinophils Relative: 1 %
HCT: 36.3 % — ABNORMAL LOW (ref 39.0–52.0)
Hemoglobin: 11.9 g/dL — ABNORMAL LOW (ref 13.0–17.0)
Immature Granulocytes: 0 %
Lymphocytes Relative: 32 %
Lymphs Abs: 1.7 10*3/uL (ref 0.7–4.0)
MCH: 25.3 pg — ABNORMAL LOW (ref 26.0–34.0)
MCHC: 32.8 g/dL (ref 30.0–36.0)
MCV: 77.2 fL — ABNORMAL LOW (ref 80.0–100.0)
Monocytes Absolute: 0.7 10*3/uL (ref 0.1–1.0)
Monocytes Relative: 15 %
Neutro Abs: 2.6 10*3/uL (ref 1.7–7.7)
Neutrophils Relative %: 51 %
Platelets: 149 10*3/uL — ABNORMAL LOW (ref 150–400)
RBC: 4.7 MIL/uL (ref 4.22–5.81)
RDW: 15.9 % — ABNORMAL HIGH (ref 11.5–15.5)
WBC: 5.1 10*3/uL (ref 4.0–10.5)
nRBC: 0 % (ref 0.0–0.2)

## 2019-10-22 LAB — MISC LABCORP TEST (SEND OUT): Labcorp test code: 165600

## 2019-11-09 ENCOUNTER — Other Ambulatory Visit
Admission: RE | Admit: 2019-11-09 | Discharge: 2019-11-09 | Disposition: A | Discharge status: Home / Self Care | Payer: Medicare Other | Source: Ambulatory Visit | Attending: Nephrology | Admitting: Nephrology

## 2019-11-09 DIAGNOSIS — E559 Vitamin D deficiency, unspecified: Secondary | ICD-10-CM | POA: Insufficient documentation

## 2019-11-09 DIAGNOSIS — Z114 Encounter for screening for human immunodeficiency virus [HIV]: Secondary | ICD-10-CM | POA: Diagnosis not present

## 2019-11-09 DIAGNOSIS — Z789 Other specified health status: Secondary | ICD-10-CM | POA: Insufficient documentation

## 2019-11-09 DIAGNOSIS — N390 Urinary tract infection, site not specified: Secondary | ICD-10-CM | POA: Insufficient documentation

## 2019-11-09 DIAGNOSIS — D899 Disorder involving the immune mechanism, unspecified: Secondary | ICD-10-CM | POA: Insufficient documentation

## 2019-11-09 DIAGNOSIS — N189 Chronic kidney disease, unspecified: Secondary | ICD-10-CM | POA: Insufficient documentation

## 2019-11-09 DIAGNOSIS — Z9483 Pancreas transplant status: Secondary | ICD-10-CM | POA: Diagnosis not present

## 2019-11-09 DIAGNOSIS — Z79899 Other long term (current) drug therapy: Secondary | ICD-10-CM | POA: Insufficient documentation

## 2019-11-09 DIAGNOSIS — E1129 Type 2 diabetes mellitus with other diabetic kidney complication: Secondary | ICD-10-CM | POA: Diagnosis not present

## 2019-11-09 DIAGNOSIS — E1122 Type 2 diabetes mellitus with diabetic chronic kidney disease: Secondary | ICD-10-CM | POA: Insufficient documentation

## 2019-11-09 DIAGNOSIS — D631 Anemia in chronic kidney disease: Secondary | ICD-10-CM | POA: Insufficient documentation

## 2019-11-09 DIAGNOSIS — B259 Cytomegaloviral disease, unspecified: Secondary | ICD-10-CM | POA: Diagnosis not present

## 2019-11-09 DIAGNOSIS — Z940 Kidney transplant status: Secondary | ICD-10-CM | POA: Insufficient documentation

## 2019-11-09 LAB — CBC WITH DIFFERENTIAL/PLATELET
Abs Immature Granulocytes: 0.01 10*3/uL (ref 0.00–0.07)
Basophils Absolute: 0 10*3/uL (ref 0.0–0.1)
Basophils Relative: 0 %
Eosinophils Absolute: 0 10*3/uL (ref 0.0–0.5)
Eosinophils Relative: 1 %
HCT: 38.2 % — ABNORMAL LOW (ref 39.0–52.0)
Hemoglobin: 11.7 g/dL — ABNORMAL LOW (ref 13.0–17.0)
Immature Granulocytes: 0 %
Lymphocytes Relative: 36 %
Lymphs Abs: 1.6 10*3/uL (ref 0.7–4.0)
MCH: 25.4 pg — ABNORMAL LOW (ref 26.0–34.0)
MCHC: 30.6 g/dL (ref 30.0–36.0)
MCV: 83 fL (ref 80.0–100.0)
Monocytes Absolute: 0.8 10*3/uL (ref 0.1–1.0)
Monocytes Relative: 18 %
Neutro Abs: 2 10*3/uL (ref 1.7–7.7)
Neutrophils Relative %: 45 %
Platelets: 188 10*3/uL (ref 150–400)
RBC: 4.6 MIL/uL (ref 4.22–5.81)
RDW: 15.8 % — ABNORMAL HIGH (ref 11.5–15.5)
WBC: 4.5 10*3/uL (ref 4.0–10.5)
nRBC: 0 % (ref 0.0–0.2)

## 2019-11-09 LAB — BASIC METABOLIC PANEL
Anion gap: 11 (ref 5–15)
BUN: 30 mg/dL — ABNORMAL HIGH (ref 8–23)
CO2: 21 mmol/L — ABNORMAL LOW (ref 22–32)
Calcium: 9.5 mg/dL (ref 8.9–10.3)
Chloride: 108 mmol/L (ref 98–111)
Creatinine, Ser: 1.22 mg/dL (ref 0.61–1.24)
GFR calc Af Amer: >60 mL/min (ref >60)
GFR calc non Af Amer: 58 mL/min — ABNORMAL LOW (ref >60)
Glucose, Bld: 141 mg/dL — ABNORMAL HIGH (ref 70–99)
Potassium: 4.9 mmol/L (ref 3.5–5.1)
Sodium: 140 mmol/L (ref 135–145)

## 2019-11-09 LAB — MAGNESIUM: Magnesium: 1.8 mg/dL (ref 1.7–2.4)

## 2019-11-09 LAB — PHOSPHORUS: Phosphorus: 3.9 mg/dL (ref 2.5–4.6)

## 2019-12-31 ENCOUNTER — Other Ambulatory Visit
Admission: RE | Admit: 2019-12-31 | Discharge: 2019-12-31 | Disposition: A | Discharge status: Home / Self Care | Payer: Medicare Other | Source: Ambulatory Visit | Attending: Nephrology | Admitting: Nephrology

## 2019-12-31 DIAGNOSIS — Z79899 Other long term (current) drug therapy: Secondary | ICD-10-CM | POA: Insufficient documentation

## 2019-12-31 DIAGNOSIS — Z789 Other specified health status: Secondary | ICD-10-CM | POA: Insufficient documentation

## 2019-12-31 DIAGNOSIS — Z940 Kidney transplant status: Secondary | ICD-10-CM | POA: Insufficient documentation

## 2019-12-31 DIAGNOSIS — N390 Urinary tract infection, site not specified: Secondary | ICD-10-CM | POA: Insufficient documentation

## 2019-12-31 DIAGNOSIS — D899 Disorder involving the immune mechanism, unspecified: Secondary | ICD-10-CM | POA: Diagnosis not present

## 2019-12-31 DIAGNOSIS — Z9483 Pancreas transplant status: Secondary | ICD-10-CM | POA: Insufficient documentation

## 2019-12-31 DIAGNOSIS — B259 Cytomegaloviral disease, unspecified: Secondary | ICD-10-CM | POA: Insufficient documentation

## 2019-12-31 DIAGNOSIS — E559 Vitamin D deficiency, unspecified: Secondary | ICD-10-CM | POA: Diagnosis not present

## 2019-12-31 DIAGNOSIS — Z114 Encounter for screening for human immunodeficiency virus [HIV]: Secondary | ICD-10-CM | POA: Diagnosis not present

## 2019-12-31 DIAGNOSIS — Z09 Encounter for follow-up examination after completed treatment for conditions other than malignant neoplasm: Secondary | ICD-10-CM | POA: Insufficient documentation

## 2019-12-31 DIAGNOSIS — E1129 Type 2 diabetes mellitus with other diabetic kidney complication: Secondary | ICD-10-CM | POA: Diagnosis not present

## 2019-12-31 DIAGNOSIS — D631 Anemia in chronic kidney disease: Secondary | ICD-10-CM | POA: Insufficient documentation

## 2019-12-31 LAB — CBC WITH DIFFERENTIAL/PLATELET
Basophils Absolute: 0 10*3/uL (ref 0.0–0.1)
Basophils Relative: 1 %
Eosinophils Absolute: 0 10*3/uL (ref 0.0–0.5)
Eosinophils Relative: 1 %
HCT: 37.3 % — ABNORMAL LOW (ref 39.0–52.0)
Hemoglobin: 11.8 g/dL — ABNORMAL LOW (ref 13.0–17.0)
Lymphocytes Relative: 33 %
Lymphs Abs: 1.4 10*3/uL (ref 0.7–4.0)
MCH: 26.3 pg (ref 26.0–34.0)
MCHC: 31.6 g/dL (ref 30.0–36.0)
MCV: 83.3 fL (ref 80.0–100.0)
Monocytes Absolute: 0.5 10*3/uL (ref 0.1–1.0)
Monocytes Relative: 12 %
Neutro Abs: 2.2 10*3/uL (ref 1.7–7.7)
Neutrophils Relative %: 53 %
Platelets: 194 10*3/uL (ref 150–400)
RBC: 4.48 MIL/uL (ref 4.22–5.81)
RDW: 13.5 % (ref 11.5–15.5)
WBC: 4.1 10*3/uL (ref 4.0–10.5)
nRBC: 0 /100 WBC

## 2019-12-31 LAB — BASIC METABOLIC PANEL
Anion gap: 9 (ref 5–15)
BUN: 32 mg/dL — ABNORMAL HIGH (ref 8–23)
CO2: 20 mmol/L — ABNORMAL LOW (ref 22–32)
Calcium: 9.7 mg/dL (ref 8.9–10.3)
Chloride: 106 mmol/L (ref 98–111)
Creatinine, Ser: 1.38 mg/dL — ABNORMAL HIGH (ref 0.61–1.24)
GFR calc Af Amer: 58 mL/min — ABNORMAL LOW (ref >60)
GFR calc non Af Amer: 50 mL/min — ABNORMAL LOW (ref >60)
Glucose, Bld: 129 mg/dL — ABNORMAL HIGH (ref 70–99)
Potassium: 4.8 mmol/L (ref 3.5–5.1)
Sodium: 135 mmol/L (ref 135–145)

## 2019-12-31 LAB — MAGNESIUM: Magnesium: 1.9 mg/dL (ref 1.7–2.4)

## 2019-12-31 LAB — PHOSPHORUS: Phosphorus: 3.5 mg/dL (ref 2.5–4.6)

## 2020-01-16 ENCOUNTER — Emergency Department
Admission: EM | Admit: 2020-01-16 | Discharge: 2020-01-16 | Disposition: A | Discharge status: Home / Self Care | Payer: Medicare Other | Attending: Student in an Organized Health Care Education/Training Program | Admitting: Student in an Organized Health Care Education/Training Program

## 2020-01-16 ENCOUNTER — Other Ambulatory Visit: Payer: Self-pay

## 2020-01-16 ENCOUNTER — Emergency Department: Payer: Medicare Other

## 2020-01-16 ENCOUNTER — Encounter: Payer: Self-pay | Admitting: Emergency Medicine

## 2020-01-16 DIAGNOSIS — Z91040 Latex allergy status: Secondary | ICD-10-CM | POA: Insufficient documentation

## 2020-01-16 DIAGNOSIS — N189 Chronic kidney disease, unspecified: Secondary | ICD-10-CM | POA: Diagnosis not present

## 2020-01-16 DIAGNOSIS — Z85528 Personal history of other malignant neoplasm of kidney: Secondary | ICD-10-CM | POA: Diagnosis not present

## 2020-01-16 DIAGNOSIS — Z8673 Personal history of transient ischemic attack (TIA), and cerebral infarction without residual deficits: Secondary | ICD-10-CM | POA: Diagnosis not present

## 2020-01-16 DIAGNOSIS — M79602 Pain in left arm: Secondary | ICD-10-CM | POA: Diagnosis present

## 2020-01-16 DIAGNOSIS — Z940 Kidney transplant status: Secondary | ICD-10-CM | POA: Insufficient documentation

## 2020-01-16 DIAGNOSIS — E1140 Type 2 diabetes mellitus with diabetic neuropathy, unspecified: Secondary | ICD-10-CM | POA: Insufficient documentation

## 2020-01-16 DIAGNOSIS — E1122 Type 2 diabetes mellitus with diabetic chronic kidney disease: Secondary | ICD-10-CM | POA: Insufficient documentation

## 2020-01-16 DIAGNOSIS — Z79899 Other long term (current) drug therapy: Secondary | ICD-10-CM | POA: Diagnosis not present

## 2020-01-16 DIAGNOSIS — Z794 Long term (current) use of insulin: Secondary | ICD-10-CM | POA: Diagnosis not present

## 2020-01-16 DIAGNOSIS — Z7982 Long term (current) use of aspirin: Secondary | ICD-10-CM | POA: Diagnosis not present

## 2020-01-16 DIAGNOSIS — I129 Hypertensive chronic kidney disease with stage 1 through stage 4 chronic kidney disease, or unspecified chronic kidney disease: Secondary | ICD-10-CM | POA: Insufficient documentation

## 2020-01-16 LAB — CBC
HCT: 38.8 % — ABNORMAL LOW (ref 39.0–52.0)
Hemoglobin: 12.1 g/dL — ABNORMAL LOW (ref 13.0–17.0)
MCH: 26.3 pg (ref 26.0–34.0)
MCHC: 31.2 g/dL (ref 30.0–36.0)
MCV: 84.3 fL (ref 80.0–100.0)
Platelets: 198 10*3/uL (ref 150–400)
RBC: 4.6 MIL/uL (ref 4.22–5.81)
RDW: 13.4 % (ref 11.5–15.5)
WBC: 4 10*3/uL (ref 4.0–10.5)
nRBC: 0 % (ref 0.0–0.2)

## 2020-01-16 LAB — COMPREHENSIVE METABOLIC PANEL
ALT: 14 U/L (ref 0–44)
AST: 20 U/L (ref 15–41)
Albumin: 4.6 g/dL (ref 3.5–5.0)
Alkaline Phosphatase: 90 U/L (ref 38–126)
Anion gap: 6 (ref 5–15)
BUN: 25 mg/dL — ABNORMAL HIGH (ref 8–23)
CO2: 24 mmol/L (ref 22–32)
Calcium: 10.1 mg/dL (ref 8.9–10.3)
Chloride: 107 mmol/L (ref 98–111)
Creatinine, Ser: 1.18 mg/dL (ref 0.61–1.24)
GFR calc Af Amer: >60 mL/min (ref >60)
GFR calc non Af Amer: >60 mL/min (ref >60)
Glucose, Bld: 173 mg/dL — ABNORMAL HIGH (ref 70–99)
Potassium: 4.7 mmol/L (ref 3.5–5.1)
Sodium: 137 mmol/L (ref 135–145)
Total Bilirubin: 0.8 mg/dL (ref 0.3–1.2)
Total Protein: 7.7 g/dL (ref 6.5–8.1)

## 2020-01-16 LAB — PROTIME-INR
INR: 1 (ref 0.8–1.2)
Prothrombin Time: 13.4 seconds (ref 11.4–15.2)

## 2020-01-16 LAB — TROPONIN I (HIGH SENSITIVITY): Troponin I (High Sensitivity): 8 ng/L (ref <18)

## 2020-01-16 NOTE — ED Triage Notes (Addendum)
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  Pt states he has not used the access in many years because of kidney transplant.  Pt states the site was originally a fistual, however due to clotting issues they made it into a graft.  Pt states noticed the hardness last night.

## 2020-01-16 NOTE — ED Provider Notes (Signed)
[facility] Emergency Department Provider Note    First MD Initiated Contact with Patient 01/16/20 1627     (approximate)  I have reviewed the triage vital signs and the nursing notes.   HISTORY  Chief Complaint Arm Pain    HPI James Hicks is a 74 y.o. 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  States the pain started yesterday.  Does not recall any trauma.  No fevers.  Felt he did have a tentative left chest discomfort yesterday but denies any pain right now.  No radiation of pain through to his back.  No nausea or vomiting.  Denies any numbness or tingling in the left extremity.  He is status post renal transplant.  Denies any shortness of breath.    Past Medical History:  Diagnosis Date  . Cholelithiasis   . Chronic renal failure    kidney transplant   . Colon polyps   . CVA (cerebral infarction)   . Degenerative joint disease   . Diabetes mellitus without complication ([place])   . Diabetic neuropathy ([place])   . Hydronephrosis of right kidney   . Hyperlipidemia   . Hypertension   . Renal cell carcinoma ([place])   . Stroke [facility])    Family History  Problem Relation Age of Onset  . Colon cancer Mother   . Diabetes Mother   . Breast cancer Sister    Past Surgical History:  Procedure Laterality Date  . CATARACT EXTRACTION    . CHOLECYSTECTOMY    . COLONOSCOPY    . COLONOSCOPY WITH PROPOFOL N/A 05/28/2018   Procedure: COLONOSCOPY WITH PROPOFOL;  Surgeon: Toledo, Benay Pike, MD;  Location: [facility] ENDOSCOPY;  Service: Gastroenterology;  Laterality: N/A;  . KIDNEY TRANSPLANT    . KNEE ARTHROSCOPY    . NEPHRECTOMY    . TONSILLECTOMY    . WISDOM TOOTH EXTRACTION     There are no problems to display for this patient.     Prior to Admission medications   Medication Sig Start Date End Date Taking? Authorizing Provider  allopurinol (ZYLOPRIM) 100 MG tablet Take 100 mg by mouth daily.    [provider]    aspirin EC 81 MG tablet Take 81 mg by mouth daily.    [provider]  atorvastatin (LIPITOR) 10 MG tablet Take 10 mg by mouth daily.    [provider]  calcium-vitamin D (OSCAL WITH D) 500-200 MG-UNIT tablet Take 1 tablet by mouth 2 (two) times daily.    [provider]  clopidogrel (PLAVIX) 75 MG tablet Take 75 mg by mouth daily.    [provider]  colchicine 0.6 MG tablet Take 0.6 mg by mouth daily.    [provider]  enalapril (VASOTEC) 20 MG tablet Take 20 mg by mouth daily.    [provider]  furosemide (LASIX) 20 MG tablet Take 20 mg by mouth 2 (two) times daily.    [provider]  hydrochlorothiazide (HYDRODIURIL) 50 MG tablet Take 50 mg by mouth daily.    [provider]  HYDROcodone-acetaminophen (NORCO/VICODIN) 5-325 MG tablet Take 1 tablet by mouth every 4 (four) hours as needed for moderate pain. 07/05/19   Cuthriell, Charline Bills, PA-C  insulin glargine (LANTUS) 100 UNIT/ML injection Inject 15 Units into the skin at bedtime.     [provider]  insulin lispro (HUMALOG) 100 UNIT/ML injection Inject 10 Units into the skin 3 (three) times daily  with meals. Per sliding scale    [provider]  metoprolol succinate (TOPROL-XL) 25 MG 24 hr tablet Take 25 mg by mouth daily. 07/31/18   [provider]  mycophenolate (CELLCEPT) 500 MG tablet Take 1,000 mg by mouth 2 (two) times daily.     [provider]  NIFEdipine (PROCARDIA XL/ADALAT-CC) 60 MG 24 hr tablet Take 60 mg by mouth 2 (two) times daily.    [provider]  predniSONE (DELTASONE) 10 MG tablet Take 1 tablet (10 mg total) by mouth daily. 07/05/19   Cuthriell, Charline Bills, PA-C  tacrolimus (PROGRAF) 1 MG capsule Take 5 mg by mouth 2 (two) times daily.    [provider]  timolol (TIMOPTIC) 0.25 % ophthalmic solution Place 1 drop into both eyes 2 (two) times daily.     [provider]  traMADol (ULTRAM)  50 MG tablet Take 1 tablet (50 mg total) by mouth every 6 (six) hours as needed. 08/06/19   Caryn Section Linden Dolin, PA-C    Allergies Hylan g-f 20, Latex, and Other    Social History Social History   Tobacco Use  . Smoking status: Never Smoker  . Smokeless tobacco: Never Used  Substance Use Topics  . Alcohol use: Not Currently  . Drug use: Not Currently    Review of Systems Patient denies headaches, rhinorrhea, blurry vision, numbness, shortness of breath, chest pain, edema, cough, abdominal pain, nausea, vomiting, diarrhea, dysuria, fevers, rashes or hallucinations unless otherwise stated above in HPI. ____________________________________________   PHYSICAL EXAM:  VITAL SIGNS: Vitals:   01/16/20 1623 01/16/20 1800  BP: (!) 144/54 (!) 138/57  Pulse: 61 65  Resp: 18 17  Temp:    SpO2: 100% 99%    Constitutional: Alert and oriented.  Eyes: Conjunctivae are normal.  Head: Atraumatic. Nose: No congestion/rhinnorhea. Mouth/Throat: Mucous membranes are moist.   Neck: No stridor. Painless ROM.  Cardiovascular: Normal rate, regular rhythm. Grossly normal heart sounds.  Good peripheral circulation.  Firm lue fistula without overlying warmth or erythema Respiratory: Normal respiratory effort.  No retractions. Lungs CTAB. Gastrointestinal: Soft and nontender. No distention. No abdominal bruits. No CVA tenderness. Genitourinary:  Musculoskeletal: No lower extremity tenderness nor edema.  No joint effusions. Neurologic:  Normal speech and language. No gross focal neurologic deficits are appreciated. No facial droop Skin:  Skin is warm, dry and intact. No rash noted. Psychiatric: Mood and affect are normal. Speech and behavior are normal.  ____________________________________________   LABS (all labs ordered are listed, but only abnormal results are displayed)  Results for orders placed or performed during the hospital encounter of 01/16/20 (from the past 24 hour(s))  CBC      Status: Abnormal   Collection Time: 01/16/20  3:23 PM  Result Value Ref Range   WBC 4.0 4.0 - 10.5 K/uL   RBC 4.60 4.22 - 5.81 MIL/uL   Hemoglobin 12.1 (L) 13.0 - 17.0 g/dL   HCT 38.8 (L) 39.0 - 52.0 %   MCV 84.3 80.0 - 100.0 fL   MCH 26.3 26.0 - 34.0 pg   MCHC 31.2 30.0 - 36.0 g/dL   RDW 13.4 11.5 - 15.5 %   Platelets 198 150 - 400 K/uL   nRBC 0.0 0.0 - 0.2 %  Comprehensive metabolic panel     Status: Abnormal   Collection Time: 01/16/20  3:23 PM  Result Value Ref Range   Sodium 137 135 - 145 mmol/L   Potassium 4.7 3.5 - 5.1 mmol/L   Chloride  107 98 - 111 mmol/L   CO2 24 22 - 32 mmol/L   Glucose, Bld 173 (H) 70 - 99 mg/dL   BUN 25 (H) 8 - 23 mg/dL   Creatinine, Ser 1.18 0.61 - 1.24 mg/dL   Calcium 10.1 8.9 - 10.3 mg/dL   Total Protein 7.7 6.5 - 8.1 g/dL   Albumin 4.6 3.5 - 5.0 g/dL   AST 20 15 - 41 U/L   ALT 14 0 - 44 U/L   Alkaline Phosphatase 90 38 - 126 U/L   Total Bilirubin 0.8 0.3 - 1.2 mg/dL   GFR calc non Af Amer >60 >60 mL/min   GFR calc Af Amer >60 >60 mL/min   Anion gap 6 5 - 15  Protime-INR     Status: None   Collection Time: 01/16/20  3:23 PM  Result Value Ref Range   Prothrombin Time 13.4 11.4 - 15.2 seconds   INR 1.0 0.8 - 1.2  Troponin I (High Sensitivity)     Status: None   Collection Time: 01/16/20  5:35 PM  Result Value Ref Range   Troponin I (High Sensitivity) 8 <18 ng/L   ____________________________________________  EKG  My review and personal interpretation at Time: 18:50   Indication: sob  Rate: 60  Rhythm: sinus Axis: normal Other: normal intervals, no stemi, nonspecific abn ____________________________________________  RADIOLOGY  I personally reviewed all radiographic images ordered to evaluate for the above acute complaints and reviewed radiology reports and findings.  These findings were personally discussed with the patient.  Please see medical record for radiology  report.  ____________________________________________   PROCEDURES  Procedure(s) performed:  Procedures    Critical Care performed: no ____________________________________________   INITIAL IMPRESSION / ASSESSMENT AND PLAN / ED COURSE  Pertinent labs & imaging results that were available during my care of the patient were reviewed by me and considered in my medical decision making (see chart for details).   DDX: DVT, fistula, erythema, contusion, phlebitis musculoskeletal strain  James Hicks is a 74 y.o. who presents to the ED with symptoms as described above.  Well-appearing and nontoxic.  Blood work is reassuring.  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  No evidence of symptoms or blood work to suggest ACS.  Patient does think he might of slept on his left arm causing some discomfort.  He has good distal perfusion.  There is certainly no evidence of overlying erythema or infectious findings.  I do believe he stable appropriate for outpatient follow-up.     The patient was evaluated in Emergency Department today for the symptoms described in the history of present illness. He/she was evaluated in the context of the global COVID-19 pandemic, which necessitated consideration that the patient might be at risk for infection with the SARS-CoV-2 virus that causes COVID-19. Institutional protocols and algorithms that pertain to the evaluation of patients at risk for COVID-19 are in a state of rapid change based on information released by regulatory bodies including the CDC and federal and state organizations. These policies and algorithms were followed during the patient's care in the ED.  As part of my medical decision making, I reviewed the following data within the [place] notes reviewed and incorporated, Labs reviewed, notes from prior ED visits and [place] Controlled Substance  Database   ____________________________________________   FINAL CLINICAL IMPRESSION(S) / ED DIAGNOSES  Final diagnoses:  Left arm pain      NEW  MEDICATIONS STARTED DURING THIS VISIT:  New Prescriptions   No medications on file     Note:  This document was prepared using Dragon voice recognition software and may include unintentional dictation errors.    Merlyn Lot, MD 01/16/20 [id number]

## 2020-01-16 NOTE — Discharge Instructions (Addendum)
Please follow up with PCP.  Reutnr for worsening chest pain, shortness of breath, fevers or for any additional questions or concerns.

## 2020-01-16 NOTE — ED Notes (Signed)
US at bedside

## 2020-01-16 NOTE — ED Notes (Signed)
First Nurse Note: Pt to ED for left arm pain. Pt has old fistula in left arm where pain is. Pt is in NAD.

## 2020-03-10 ENCOUNTER — Other Ambulatory Visit: Payer: Self-pay

## 2020-03-10 ENCOUNTER — Other Ambulatory Visit
Admission: RE | Admit: 2020-03-10 | Discharge: 2020-03-10 | Disposition: A | Discharge status: Home / Self Care | Payer: Medicare Other | Attending: Nephrology | Admitting: Nephrology

## 2020-03-10 DIAGNOSIS — D899 Disorder involving the immune mechanism, unspecified: Secondary | ICD-10-CM | POA: Diagnosis present

## 2020-03-10 LAB — BASIC METABOLIC PANEL
Anion gap: 7 (ref 5–15)
BUN: 42 mg/dL — ABNORMAL HIGH (ref 8–23)
CO2: 24 mmol/L (ref 22–32)
Calcium: 9.8 mg/dL (ref 8.9–10.3)
Chloride: 105 mmol/L (ref 98–111)
Creatinine, Ser: 1.68 mg/dL — ABNORMAL HIGH (ref 0.61–1.24)
GFR calc Af Amer: 46 mL/min — ABNORMAL LOW (ref >60)
GFR calc non Af Amer: 39 mL/min — ABNORMAL LOW (ref >60)
Glucose, Bld: 130 mg/dL — ABNORMAL HIGH (ref 70–99)
Potassium: 5.2 mmol/L — ABNORMAL HIGH (ref 3.5–5.1)
Sodium: 136 mmol/L (ref 135–145)

## 2020-03-10 LAB — CBC WITH DIFFERENTIAL/PLATELET
Abs Immature Granulocytes: 0 10*3/uL (ref 0.00–0.07)
Basophils Absolute: 0 10*3/uL (ref 0.0–0.1)
Basophils Relative: 1 %
Eosinophils Absolute: 0.1 10*3/uL (ref 0.0–0.5)
Eosinophils Relative: 2 %
HCT: 36.4 % — ABNORMAL LOW (ref 39.0–52.0)
Hemoglobin: 11.4 g/dL — ABNORMAL LOW (ref 13.0–17.0)
Immature Granulocytes: 0 %
Lymphocytes Relative: 36 %
Lymphs Abs: 1.2 10*3/uL (ref 0.7–4.0)
MCH: 26.6 pg (ref 26.0–34.0)
MCHC: 31.3 g/dL (ref 30.0–36.0)
MCV: 84.8 fL (ref 80.0–100.0)
Monocytes Absolute: 0.5 10*3/uL (ref 0.1–1.0)
Monocytes Relative: 16 %
Neutro Abs: 1.6 10*3/uL — ABNORMAL LOW (ref 1.7–7.7)
Neutrophils Relative %: 45 %
Platelets: 171 10*3/uL (ref 150–400)
RBC: 4.29 MIL/uL (ref 4.22–5.81)
RDW: 13.4 % (ref 11.5–15.5)
WBC: 3.4 10*3/uL — ABNORMAL LOW (ref 4.0–10.5)
nRBC: 0 % (ref 0.0–0.2)

## 2020-03-10 LAB — PHOSPHORUS: Phosphorus: 3.7 mg/dL (ref 2.5–4.6)

## 2020-03-10 LAB — MAGNESIUM: Magnesium: 2.1 mg/dL (ref 1.7–2.4)

## 2020-03-11 LAB — TACROLIMUS LEVEL: Tacrolimus (FK506) - LabCorp: 5.2 ng/mL (ref 2.0–20.0)

## 2020-03-21 ENCOUNTER — Emergency Department: Payer: Medicare Other

## 2020-03-21 ENCOUNTER — Ambulatory Visit (HOSPITAL_COMMUNITY)
Admission: AD | Admit: 2020-03-21 | Discharge: 2020-03-21 | Disposition: A | Discharge status: Home / Self Care | Payer: Medicare Other | Source: Other Acute Inpatient Hospital | Attending: Emergency Medicine | Admitting: Emergency Medicine

## 2020-03-21 ENCOUNTER — Emergency Department
Admission: EM | Admit: 2020-03-21 | Discharge: 2020-03-21 | Payer: Medicare Other | Attending: Emergency Medicine | Admitting: Emergency Medicine

## 2020-03-21 ENCOUNTER — Other Ambulatory Visit: Payer: Self-pay

## 2020-03-21 DIAGNOSIS — N189 Chronic kidney disease, unspecified: Secondary | ICD-10-CM | POA: Diagnosis not present

## 2020-03-21 DIAGNOSIS — Z940 Kidney transplant status: Secondary | ICD-10-CM | POA: Diagnosis not present

## 2020-03-21 DIAGNOSIS — I6200 Nontraumatic subdural hemorrhage, unspecified: Secondary | ICD-10-CM | POA: Insufficient documentation

## 2020-03-21 DIAGNOSIS — Z8673 Personal history of transient ischemic attack (TIA), and cerebral infarction without residual deficits: Secondary | ICD-10-CM | POA: Diagnosis not present

## 2020-03-21 DIAGNOSIS — Z91040 Latex allergy status: Secondary | ICD-10-CM | POA: Insufficient documentation

## 2020-03-21 DIAGNOSIS — Z794 Long term (current) use of insulin: Secondary | ICD-10-CM | POA: Diagnosis not present

## 2020-03-21 DIAGNOSIS — Z7982 Long term (current) use of aspirin: Secondary | ICD-10-CM | POA: Insufficient documentation

## 2020-03-21 DIAGNOSIS — Z7902 Long term (current) use of antithrombotics/antiplatelets: Secondary | ICD-10-CM | POA: Diagnosis not present

## 2020-03-21 DIAGNOSIS — E1122 Type 2 diabetes mellitus with diabetic chronic kidney disease: Secondary | ICD-10-CM | POA: Diagnosis not present

## 2020-03-21 DIAGNOSIS — Z79899 Other long term (current) drug therapy: Secondary | ICD-10-CM | POA: Insufficient documentation

## 2020-03-21 DIAGNOSIS — Z85528 Personal history of other malignant neoplasm of kidney: Secondary | ICD-10-CM | POA: Insufficient documentation

## 2020-03-21 DIAGNOSIS — Y93H2 Activity, gardening and landscaping: Secondary | ICD-10-CM | POA: Insufficient documentation

## 2020-03-21 DIAGNOSIS — S06369A Traumatic hemorrhage of cerebrum, unspecified, with loss of consciousness of unspecified duration, initial encounter: Secondary | ICD-10-CM | POA: Diagnosis not present

## 2020-03-21 DIAGNOSIS — S0990XA Unspecified injury of head, initial encounter: Secondary | ICD-10-CM | POA: Diagnosis present

## 2020-03-21 DIAGNOSIS — Y92017 Garden or yard in single-family (private) house as the place of occurrence of the external cause: Secondary | ICD-10-CM | POA: Diagnosis not present

## 2020-03-21 DIAGNOSIS — I129 Hypertensive chronic kidney disease with stage 1 through stage 4 chronic kidney disease, or unspecified chronic kidney disease: Secondary | ICD-10-CM | POA: Diagnosis not present

## 2020-03-21 DIAGNOSIS — S065X9A Traumatic subdural hemorrhage with loss of consciousness of unspecified duration, initial encounter: Secondary | ICD-10-CM | POA: Diagnosis not present

## 2020-03-21 DIAGNOSIS — W108XXA Fall (on) (from) other stairs and steps, initial encounter: Secondary | ICD-10-CM | POA: Insufficient documentation

## 2020-03-21 DIAGNOSIS — Y999 Unspecified external cause status: Secondary | ICD-10-CM | POA: Insufficient documentation

## 2020-03-21 LAB — PROTIME-INR
INR: 1.1 (ref 0.8–1.2)
Prothrombin Time: 14 seconds (ref 11.4–15.2)

## 2020-03-21 LAB — CBC
HCT: 32.6 % — ABNORMAL LOW (ref 39.0–52.0)
Hemoglobin: 10.3 g/dL — ABNORMAL LOW (ref 13.0–17.0)
MCH: 26.5 pg (ref 26.0–34.0)
MCHC: 31.6 g/dL (ref 30.0–36.0)
MCV: 83.8 fL (ref 80.0–100.0)
Platelets: 159 10*3/uL (ref 150–400)
RBC: 3.89 MIL/uL — ABNORMAL LOW (ref 4.22–5.81)
RDW: 13.5 % (ref 11.5–15.5)
WBC: 4.3 10*3/uL (ref 4.0–10.5)
nRBC: 0 % (ref 0.0–0.2)

## 2020-03-21 LAB — ABO/RH: ABO/RH(D): AB POS

## 2020-03-21 LAB — BASIC METABOLIC PANEL
Anion gap: 7 (ref 5–15)
BUN: 52 mg/dL — ABNORMAL HIGH (ref 8–23)
CO2: 22 mmol/L (ref 22–32)
Calcium: 10 mg/dL (ref 8.9–10.3)
Chloride: 108 mmol/L (ref 98–111)
Creatinine, Ser: 1.6 mg/dL — ABNORMAL HIGH (ref 0.61–1.24)
GFR calc Af Amer: 48 mL/min — ABNORMAL LOW (ref >60)
GFR calc non Af Amer: 42 mL/min — ABNORMAL LOW (ref >60)
Glucose, Bld: 124 mg/dL — ABNORMAL HIGH (ref 70–99)
Potassium: 5 mmol/L (ref 3.5–5.1)
Sodium: 137 mmol/L (ref 135–145)

## 2020-03-21 MED ORDER — SODIUM CHLORIDE 0.9 % IV SOLN
10.0000 mL/h | Freq: Once | INTRAVENOUS | Status: AC
  Administered 2020-03-21: 10 mL/h via INTRAVENOUS

## 2020-03-21 MED ORDER — ACETAMINOPHEN 500 MG PO TABS
1000.0000 mg | ORAL_TABLET | ORAL | Status: AC
  Administered 2020-03-21: 1000 mg via ORAL
  Filled 2020-03-21: qty 2

## 2020-03-21 NOTE — ED Notes (Signed)
XRAY  POWERSHARE  WITH  [facility] 

## 2020-03-21 NOTE — ED Notes (Signed)
[facility]  CALLED  PER  DR  Jacqualine Code  MD

## 2020-03-21 NOTE — ED Provider Notes (Signed)
[facility] Emergency Department Provider Note  ____________________________________________   First MD Initiated Contact with Patient 03/21/20 1259     (approximate)  I have reviewed the triage vital signs and the nursing notes.   HISTORY  Chief Complaint Fall and Altered Mental Status   EM caveat: Patient confused, poor recollection of the event and some baseline mild memory issues per wife  HPI James Hicks is a 74 y.o. male presents for evaluation after a fall  Patient was noted to fall, has a large bruise on the back of his head.  Also confusion.   Wife reports he was out gardening, she told him to come in because was so hot outside.  About a minute or 2 later she went outside and found that he had fallen at the base of their steps coming up to the house her deck.  He was confused.  She called EMS for further evaluation  He has been in his normal state of health until this happened, he did not lose consciousness, but he does seem a little more confused than normal.  He does take Plavix which she last took last night  Past Medical History:  Diagnosis Date  . Cholelithiasis   . Chronic renal failure    kidney transplant   . Colon polyps   . CVA (cerebral infarction)   . Degenerative joint disease   . Diabetes mellitus without complication ([place])   . Diabetic neuropathy ([place])   . Hydronephrosis of right kidney   . Hyperlipidemia   . Hypertension   . Renal cell carcinoma ([place])   . Stroke [facility])     There are no problems to display for this patient.   Past Surgical History:  Procedure Laterality Date  . CATARACT EXTRACTION    . CHOLECYSTECTOMY    . COLONOSCOPY    . COLONOSCOPY WITH PROPOFOL N/A 05/28/2018   Procedure: COLONOSCOPY WITH PROPOFOL;  Surgeon: Toledo, Benay Pike, MD;  Location: [facility] ENDOSCOPY;  Service: Gastroenterology;  Laterality: N/A;  . KIDNEY TRANSPLANT    . KNEE ARTHROSCOPY    . NEPHRECTOMY    . TONSILLECTOMY    .  WISDOM TOOTH EXTRACTION      Prior to Admission medications   Medication Sig Start Date End Date Taking? Authorizing Provider  allopurinol (ZYLOPRIM) 100 MG tablet Take 100 mg by mouth daily.    [provider]  aspirin EC 81 MG tablet Take 81 mg by mouth daily.    [provider]  atorvastatin (LIPITOR) 10 MG tablet Take 10 mg by mouth daily.    [provider]  calcium-vitamin D (OSCAL WITH D) 500-200 MG-UNIT tablet Take 1 tablet by mouth 2 (two) times daily.    [provider]  clopidogrel (PLAVIX) 75 MG tablet Take 75 mg by mouth daily.    [provider]  colchicine 0.6 MG tablet Take 0.6 mg by mouth daily.    [provider]  enalapril (VASOTEC) 20 MG tablet Take 20 mg by mouth daily.    [provider]  furosemide (LASIX) 20 MG tablet Take 20 mg by mouth 2 (two) times daily.    [provider]  hydrochlorothiazide (HYDRODIURIL) 50 MG tablet Take 50 mg by mouth daily.    [provider]  HYDROcodone-acetaminophen (NORCO/VICODIN) 5-325 MG tablet Take 1 tablet by mouth every 4 (four) hours as needed for moderate pain. 07/05/19   Cuthriell, Charline Bills, PA-C  insulin glargine (LANTUS) 100 UNIT/ML injection Inject  15 Units into the skin at bedtime.     [provider]  insulin lispro (HUMALOG) 100 UNIT/ML injection Inject 10 Units into the skin 3 (three) times daily with meals. Per sliding scale    [provider]  metoprolol succinate (TOPROL-XL) 25 MG 24 hr tablet Take 25 mg by mouth daily. 07/31/18   [provider]  mycophenolate (CELLCEPT) 500 MG tablet Take 1,000 mg by mouth 2 (two) times daily.     [provider]  NIFEdipine (PROCARDIA XL/ADALAT-CC) 60 MG 24 hr tablet Take 60 mg by mouth 2 (two) times daily.    [provider]  predniSONE (DELTASONE) 10 MG tablet Take 1 tablet (10 mg total) by mouth daily. 07/05/19   Cuthriell, Charline Bills, PA-C  tacrolimus (PROGRAF) 1  MG capsule Take 5 mg by mouth 2 (two) times daily.    [provider]  timolol (TIMOPTIC) 0.25 % ophthalmic solution Place 1 drop into both eyes 2 (two) times daily.     [provider]  traMADol (ULTRAM) 50 MG tablet Take 1 tablet (50 mg total) by mouth every 6 (six) hours as needed. 08/06/19   Fisher, Linden Dolin, PA-C    Allergies Hylan g-f 20, Latex, and Other  Family History  Problem Relation Age of Onset  . Colon cancer Mother   . Diabetes Mother   . Breast cancer Sister     Social History Social History   Tobacco Use  . Smoking status: Never Smoker  . Smokeless tobacco: Never Used  Substance Use Topics  . Alcohol use: Not Currently  . Drug use: Not Currently    Review of Systems Constitutional: No fever/chills Eyes: No visual changes. ENT: No sore throat.  Lazy left eye, nothing new. Cardiovascular: Denies chest pain. Respiratory: Denies shortness of breath. Gastrointestinal: No abdominal pain.   Genitourinary: Negative for dysuria. Musculoskeletal: Negative for back pain. Skin: Negative for rash. Neurological: Negative for areas of focal weakness or numbness.    ____________________________________________   PHYSICAL EXAM:  VITAL SIGNS: ED Triage Vitals  Enc Vitals Group     BP 03/21/20 1247 (!) 115/58     Pulse Rate 03/21/20 1247 64     Resp 03/21/20 1247 17     Temp 03/21/20 1247 98 F (36.7 C)     Temp src --      SpO2 03/21/20 1247 100 %     Weight 03/21/20 1248 160 lb (72.6 kg)     Height 03/21/20 1248 6\' 3"  (1.905 m)     Head Circumference --      Peak Flow --      Pain Score 03/21/20 1248 10     Pain Loc --      Pain Edu? --      Excl. in [place]? --     Constitutional: Alert and oriented to self and wife but not to year or events of the day. Well appearing and in no acute distress. Eyes: Conjunctivae are normal.  Mild occasional esotropia noted in the left eye, patient and wife report this is chronic. Head: Atraumatic except  for a fairly large left posterior scalp hematoma without bleeding. Nose: No congestion/rhinnorhea. Mouth/Throat: Mucous membranes are moist. Neck: No stridor.  No midline cervical tenderness. Cardiovascular: Normal rate, regular rhythm. Grossly normal heart sounds.  Good peripheral circulation. Respiratory: Normal respiratory effort.  No retractions. Lungs CTAB. Gastrointestinal: Soft and nontender. No distention. Musculoskeletal: No lower extremity tenderness nor edema.  Some abrasion over the  posterior left ear shoulder.  Ranges both shoulders well. Neurologic:  Normal speech and language. No gross focal neurologic deficits are appreciated.  No facial droop.  Good use of all extremities to command. Skin:  Skin is warm, dry and intact. No rash noted. Psychiatric: Mood and affect are normal. Speech and behavior are normal.  He does seem slightly confused at times however.  ____________________________________________   LABS (all labs ordered are listed, but only abnormal results are displayed)  Labs Reviewed  CBC - Abnormal; Notable for the following components:      Result Value   RBC 3.89 (*)    Hemoglobin 10.3 (*)    HCT 32.6 (*)    All other components within normal limits  BASIC METABOLIC PANEL - Abnormal; Notable for the following components:   Glucose, Bld 124 (*)    BUN 52 (*)    Creatinine, Ser 1.60 (*)    GFR calc non Af Amer 42 (*)    GFR calc Af Amer 48 (*)    All other components within normal limits  PROTIME-INR  URINALYSIS, COMPLETE (UACMP) WITH MICROSCOPIC  PREPARE PLATELET PHERESIS  ABO/RH  TYPE AND SCREEN   ____________________________________________  EKG  EKG is reviewed and interpreted by me at 1242 QRS 80 QTc 400 Sinus rhythm, heart rate 65 Normal sinus rhythm, repolarization abnormality, compared with previous EKG from January 16, 2020 no significant changes are noted.  ____________________________________________  RADIOLOGY  CT Head Wo  Contrast  Result Date: 03/21/2020 CLINICAL DATA:  [place]. Hit head. EXAM: CT HEAD WITHOUT CONTRAST TECHNIQUE: Contiguous axial images were obtained from the base of the skull through the vertex without intravenous contrast. COMPARISON:  Head CT 08/13/2018 FINDINGS: Brain: There is a right-sided subdural hematoma with maximum diameter of 11.5 mm. There is also a small focus of probable subarachnoid hemorrhage in the left parietal area. Alternatively this could be a small parenchymal contusion. Underlying age advanced cerebral atrophy, ventriculomegaly and periventricular white matter disease. No significant mass effect on the midline structures. Remote lacunar type infarct noted in the right thalamic area. No CT findings for hemispheric infarction. No intraventricular blood. Vascular: Stable vascular calcifications. No aneurysm or hyperdense vessels. Skull: No skull fracture is identified. No skull lesions. Sinuses/Orbits: The paranasal sinuses and mastoid air cells are clear. The globes are intact. Other: There is a sizable scalp hematoma in the left high posterior parietal region. No underlying skull fracture. CT CERVICAL SPINE FINDINGS Alignment: Normal overall alignment. Moderate degenerative cervical spondylosis with multilevel disc disease and facet disease. C6-7 are partially fused. Skull base and vertebrae: No acute cervical spine fracture is identified. Soft tissues and spinal canal: No prevertebral fluid or swelling. No visible canal hematoma. Disc levels: Advanced degenerative disc disease and facet disease but no large disc protrusions or canal hematoma. Upper chest: The lung apices are grossly clear. Other: No significant findings.  Vascular calcifications noted. IMPRESSION: 1. Right-sided subdural hematoma and small amount of traumatic left parietal subarachnoid blood or possible parenchymal hemorrhagic contusion. No significant mass effect on the midline structures. 2. Sizable left posterior parietal  scalp hematoma but no underlying skull fracture. 3. Age advanced cerebral atrophy, ventriculomegaly and periventricular white matter disease. 4. Advanced degenerative cervical spondylosis but no acute cervical spine fracture. Electronically Signed   By: Marijo Sanes M.D.   On: 03/21/2020 14:07   CT Cervical Spine Wo Contrast  Result Date: 03/21/2020 CLINICAL DATA:  [place]. Hit head. EXAM: CT HEAD WITHOUT CONTRAST TECHNIQUE: Contiguous axial images  were obtained from the base of the skull through the vertex without intravenous contrast. COMPARISON:  Head CT 08/13/2018 FINDINGS: Brain: There is a right-sided subdural hematoma with maximum diameter of 11.5 mm. There is also a small focus of probable subarachnoid hemorrhage in the left parietal area. Alternatively this could be a small parenchymal contusion. Underlying age advanced cerebral atrophy, ventriculomegaly and periventricular white matter disease. No significant mass effect on the midline structures. Remote lacunar type infarct noted in the right thalamic area. No CT findings for hemispheric infarction. No intraventricular blood. Vascular: Stable vascular calcifications. No aneurysm or hyperdense vessels. Skull: No skull fracture is identified. No skull lesions. Sinuses/Orbits: The paranasal sinuses and mastoid air cells are clear. The globes are intact. Other: There is a sizable scalp hematoma in the left high posterior parietal region. No underlying skull fracture. CT CERVICAL SPINE FINDINGS Alignment: Normal overall alignment. Moderate degenerative cervical spondylosis with multilevel disc disease and facet disease. C6-7 are partially fused. Skull base and vertebrae: No acute cervical spine fracture is identified. Soft tissues and spinal canal: No prevertebral fluid or swelling. No visible canal hematoma. Disc levels: Advanced degenerative disc disease and facet disease but no large disc protrusions or canal hematoma. Upper chest: The lung apices are  grossly clear. Other: No significant findings.  Vascular calcifications noted. IMPRESSION: 1. Right-sided subdural hematoma and small amount of traumatic left parietal subarachnoid blood or possible parenchymal hemorrhagic contusion. No significant mass effect on the midline structures. 2. Sizable left posterior parietal scalp hematoma but no underlying skull fracture. 3. Age advanced cerebral atrophy, ventriculomegaly and periventricular white matter disease. 4. Advanced degenerative cervical spondylosis but no acute cervical spine fracture. Electronically Signed   By: Marijo Sanes M.D.   On: 03/21/2020 14:07    Cervical CT reviewed negative for acute cervical injury.  CT of the head reviewed discussed with radiologist, right-sided subdural hematoma.  Also noted left scalp hematoma.  Small amount of traumatic left parietal subarachnoid or possible parenchymal hemorrhagic contusion  Imaging power shared to [facility] by tech ____________________________________________   PROCEDURES  Procedure(s) performed: None  Procedures  Critical Care performed: Yes, see critical care note(s)  CRITICAL CARE Performed by: Delman Kitten   Total critical care time: 65 minutes  Critical care time was exclusive of separately billable procedures and treating other patients.  Critical care was necessary to treat or prevent imminent or life-threatening deterioration.  Critical care was time spent personally by me on the following activities: development of treatment plan with patient and/or surrogate as well as nursing, discussions with consultants, evaluation of patient's response to treatment, examination of patient, obtaining history from patient or surrogate, ordering and performing treatments and interventions, ordering and review of laboratory studies, ordering and review of radiographic studies, pulse oximetry and re-evaluation of patient's  condition.   ____________________________________________   INITIAL IMPRESSION / ASSESSMENT AND PLAN / ED COURSE  Pertinent labs & imaging results that were available during my care of the patient were reviewed by me and considered in my medical decision making (see chart for details).   Patient presents for a fall, unwitnessed.  Port was out in the yard, and found having fallen at the base of his steps.  Wife reports he thinks he probably fell down some of the steps.    Clinical Course as of Mar 21 1444  Mon Mar 21, 2020  1344 Patient awake and alert.  Normotensive.  Still some slight confusion.  CT of the head reviewed, very  concerning for acute traumatic hemorrhage.  Dr. Lacinda Axon of neurosurgery reviewing images at this time   [MQ]  1344 Spoke with the patient and his wife regarding CT findings, they would prefer transfer to [facility] given the patient's continuity of care and previous care there, but are also amenable to transfer to [facility] or [facility] if [facility] is busy or delayed.   [MQ]  [id number] Both patient and his wife agreeable and verbally consenting after discussing risks and benefits for platelet transfusion.  Reviewed risks and benefits.  Dr. Lacinda Axon of neurosurgery has also seen the patient and recommends transfer to neurosurgical center and advises recommendation to transfuse platelets as well because of the patient's known status on Plavix   [MQ]  1414 Transfer call to [facility] initiated at this time   [MQ]  1439 Blood bank affirmed, platelets available in house and preparing. Should be ready in "next few minute". Transfer request to [facility] ground for emergent transfer request. Latrelle Dodrill (MD) accepting to [facility]   [MQ]    Clinical Course User Index [MQ] Delman Kitten, MD   ----------------------------------------- 2:59 PM on 03/21/2020 -----------------------------------------  Patient reassessed.  Stable neurologic exam.  Currently  awaiting arrival of platelets, then transferred via critical care team to [facility] emergency room.  Patient and wife both understanding agreeable.  Stable for transport to neurosurgical center/trauma center  Vitals:   03/21/20 1415 03/21/20 1430  BP:  131/67  Pulse: 77 74  Resp: 18 19  Temp:    SpO2: 100% 100%    Blood pressure controlled. ____________________________________________   FINAL CLINICAL IMPRESSION(S) / ED DIAGNOSES  Final diagnoses:  Traumatic subdural hematoma with loss of consciousness, initial encounter [facility])  Traumatic hemorrhage of cerebrum with loss of consciousness, unspecified laterality, initial encounter [facility])        Note:  This document was prepared using Dragon voice recognition software and may include unintentional dictation errors       Delman Kitten, MD 03/21/20 1502

## 2020-03-21 NOTE — ED Notes (Signed)
Received call from lab that type and screen is hemolyzed. Will collect another one.

## 2020-03-21 NOTE — ED Notes (Signed)
[facility] requesting trop per MD Quale pt doesn't need trop. Order cancelled at this time.

## 2020-03-21 NOTE — ED Notes (Signed)
EDT Kayla going to pick up platelets

## 2020-03-21 NOTE — ED Notes (Signed)
RN Deneise Lever unable to gain 2nd line

## 2020-03-21 NOTE — ED Notes (Signed)
[facility] at bedside 

## 2020-03-21 NOTE — ED Triage Notes (Signed)
Pt comes  Via [facility] from home after a fall. Pt was outside planting tomato plants  When he apparently had a syncopal episode. EMS reports wife came outside and found him down. Pt unsure of what happened.  Pt able to states name and DOB. EMS reports some confusion with them and that is not baseline. Pt is on blood thinners. Pt hit his head and has a notable large knot on the back left side.  Pt arrives speaking in complete sentence. Pt able to follow commands.  Pt has hx of stroke and diabetes.  EMS reports VSS and CBG-124. Pt also reports weakness for last 2 days.

## 2020-03-21 NOTE — ED Notes (Signed)
Report given to Brandy RN

## 2020-03-21 NOTE — ED Notes (Signed)
Type and screen sent to lab. Unsuccessful 2nd IV attempt. Will have another RN attempt.

## 2020-03-21 NOTE — ED Notes (Signed)
Report given to [facility] ETA is about 20 minutes

## 2020-03-22 LAB — PREPARE PLATELET PHERESIS: Unit division: 0

## 2020-03-22 LAB — BPAM PLATELET PHERESIS
Blood Product Expiration Date: 202105262359
ISSUE DATE / TIME: 202105241508
Unit Type and Rh: 5100

## 2020-03-22 MED ORDER — METOPROLOL TARTRATE 25 MG PO TABS
12.50 | ORAL_TABLET | ORAL | Status: DC
Start: 2020-03-22 — End: 2020-03-22

## 2020-03-22 MED ORDER — NIFEDIPINE ER OSMOTIC RELEASE 60 MG PO TB24
60.00 | ORAL_TABLET | ORAL | Status: DC
Start: 2020-03-22 — End: 2020-03-22

## 2020-03-22 MED ORDER — LABETALOL HCL 5 MG/ML IV SOLN
10.00 | INTRAVENOUS | Status: DC
Start: ? — End: 2020-03-22

## 2020-03-22 MED ORDER — ENALAPRIL MALEATE 10 MG PO TABS
20.00 | ORAL_TABLET | ORAL | Status: DC
Start: 2020-03-23 — End: 2020-03-22

## 2020-03-22 MED ORDER — HYDRALAZINE HCL 20 MG/ML IJ SOLN
10.00 | INTRAMUSCULAR | Status: DC
Start: ? — End: 2020-03-22

## 2020-03-22 MED ORDER — FUROSEMIDE 20 MG PO TABS
20.00 | ORAL_TABLET | ORAL | Status: DC
Start: 2020-03-22 — End: 2020-03-22

## 2020-03-22 MED ORDER — TACROLIMUS 5 MG PO CAPS
5.00 | ORAL_CAPSULE | ORAL | Status: DC
Start: 2020-03-22 — End: 2020-03-22

## 2020-03-22 MED ORDER — GENERIC EXTERNAL MEDICATION
Status: DC
Start: 2020-03-23 — End: 2020-03-22

## 2020-03-22 MED ORDER — ACETAMINOPHEN 325 MG PO TABS
650.00 | ORAL_TABLET | ORAL | Status: DC
Start: ? — End: 2020-03-22

## 2020-03-22 MED ORDER — DEXTROSE 50 % IV SOLN
12.50 | INTRAVENOUS | Status: DC
Start: ? — End: 2020-03-22

## 2020-03-22 MED ORDER — COLCHICINE 0.6 MG PO TABS
0.60 | ORAL_TABLET | ORAL | Status: DC
Start: ? — End: 2020-03-22

## 2020-03-22 MED ORDER — MYCOPHENOLATE MOFETIL 500 MG PO TABS
500.00 | ORAL_TABLET | ORAL | Status: DC
Start: 2020-03-22 — End: 2020-03-22

## 2020-03-22 MED ORDER — OXYCODONE HCL 5 MG PO TABS
5.00 | ORAL_TABLET | ORAL | Status: DC
Start: ? — End: 2020-03-22

## 2020-03-22 MED ORDER — ATORVASTATIN CALCIUM 10 MG PO TABS
10.00 | ORAL_TABLET | ORAL | Status: DC
Start: 2020-03-23 — End: 2020-03-22

## 2020-03-22 MED ORDER — ALLOPURINOL 100 MG PO TABS
200.00 | ORAL_TABLET | ORAL | Status: DC
Start: 2020-03-23 — End: 2020-03-22

## 2020-03-22 MED ORDER — INSULIN REGULAR HUMAN 100 UNIT/ML IJ SOLN
0.00 | INTRAMUSCULAR | Status: DC
Start: 2020-03-22 — End: 2020-03-22

## 2020-03-22 NOTE — Consult Note (Signed)
Neurosurgery-New Consultation Evaluation 03/22/2020 CHARMING ANTONOVICH [id number]  Identifying Statement: CAN DETTMAN is a 74 y.o. male from [place] [address] with fall  Physician Requesting Consultation: [place] [facility]  History of Present Illness: Mr. Venecia is here for evaluation after a fall earlier in the day where he did hit the posterior side of his left head.  He states that he has had some headaches since then but he denies any changes in his speech, strength, or sensation.  He does take Plavix for antiplatelet therapy due to stroke history.  He does not report any other antiplatelet or anticoagulant therapy.  His wife is with him and they deny any loss of consciousness with the fall.  He is not endorsing any significant neck pain.  Given the trauma, a CT scan of the head and cervical spine were obtained and there is concern for intracranial hemorrhage.  Past Medical History:  Past Medical History:  Diagnosis Date  . Cholelithiasis   . Chronic renal failure    kidney transplant   . Colon polyps   . CVA (cerebral infarction)   . Degenerative joint disease   . Diabetes mellitus without complication ([place])   . Diabetic neuropathy ([place])   . Hydronephrosis of right kidney   . Hyperlipidemia   . Hypertension   . Renal cell carcinoma ([place])   . Stroke [facility])     Social History: Social History   Socioeconomic History  . Marital status: Married    Spouse name: Not on file  . Number of children: Not on file  . Years of education: Not on file  . Highest education level: Not on file  Occupational History  . Not on file  Tobacco Use  . Smoking status: Never Smoker  . Smokeless tobacco: Never Used  Substance and Sexual Activity  . Alcohol use: Not Currently  . Drug use: Not Currently  . Sexual activity: Not on file    Comment: Married   Other Topics Concern  . Not on file  Social History Narrative  . Not on file   Social Determinants of Health    Financial Resource Strain:   . Difficulty of Paying Living Expenses:   Food Insecurity:   . Worried About Charity fundraiser in the Last Year:   . Arboriculturist in the Last Year:   Transportation Needs:   . Film/video editor (Medical):   Marland Kitchen Lack of Transportation (Non-Medical):   Physical Activity:   . Days of Exercise per Week:   . Minutes of Exercise per Session:   Stress:   . Feeling of Stress :   Social Connections:   . Frequency of Communication with Friends and Family:   . Frequency of Social Gatherings with Friends and Family:   . Attends Religious Services:   . Active Member of Clubs or Organizations:   . Attends Archivist Meetings:   Marland Kitchen Marital Status:   Intimate Partner Violence:   . Fear of Current or Ex-Partner:   . Emotionally Abused:   Marland Kitchen Physically Abused:   . Sexually Abused:    Living arrangements (living alone, with partner): Is in room with spouse  Family History: Family History  Problem Relation Age of Onset  . Colon cancer Mother   . Diabetes Mother   . Breast cancer Sister     Review of Systems:  Review of Systems - General ROS: Negative Psychological ROS: Negative Ophthalmic ROS: Negative ENT ROS: Negative Hematological  and Lymphatic ROS: Negative  Endocrine ROS: Negative Respiratory ROS: Negative Cardiovascular ROS: Negative Gastrointestinal ROS: Negative Genito-Urinary ROS: Negative Musculoskeletal ROS: Negative Neurological ROS: Positive for headache Dermatological ROS: Negative  Physical Exam: BP 135/75   Pulse 75   Temp 98 F (36.7 C)   Resp 18   Ht 6\' 3"  (1.905 m)   Wt 72.6 kg   SpO2 99%   BMI 20.00 kg/m  Body mass index is 20 kg/m. Body surface area is 1.96 meters squared. General appearance: Alert, cooperative, in no acute distress Head: Normocephalic, obvious subgaleal hematoma noted in the left parietal area Eyes: Normal, EOM intact Oropharynx: Moist without lesions Ext: No edema in LE  bilaterally  Neurologic exam:  Mental status: alertness: alert, orientation: Oriented to person and city, did not know month or year,  affect: normal Speech: fluent and clear, naming and repetition are intact Cranial nerves:  II: Visual fields are full by confrontation, no ptosis III/IV/VI: extra-ocular motions intact bilaterally V/VII:no evidence of facial droop or weakness  VIII: hearing normal XI: trapezius strength symmetric,  sternocleidomastoid strength symmetric XII: tongue strength symmetric  Motor:strength symmetric 5/5, normal muscle mass and tone in all extremities and no pronator drift Sensory: intact to light touch in all extremities Gait: Not tested  Laboratory: Results for orders placed or performed during the hospital encounter of 03/21/20  CBC  Result Value Ref Range   WBC 4.3 4.0 - 10.5 K/uL   RBC 3.89 (L) 4.22 - 5.81 MIL/uL   Hemoglobin 10.3 (L) 13.0 - 17.0 g/dL   HCT 32.6 (L) 39.0 - 52.0 %   MCV 83.8 80.0 - 100.0 fL   MCH 26.5 26.0 - 34.0 pg   MCHC 31.6 30.0 - 36.0 g/dL   RDW 13.5 11.5 - 15.5 %   Platelets 159 150 - 400 K/uL   nRBC 0.0 0.0 - 0.2 %  Basic metabolic panel  Result Value Ref Range   Sodium 137 135 - 145 mmol/L   Potassium 5.0 3.5 - 5.1 mmol/L   Chloride 108 98 - 111 mmol/L   CO2 22 22 - 32 mmol/L   Glucose, Bld 124 (H) 70 - 99 mg/dL   BUN 52 (H) 8 - 23 mg/dL   Creatinine, Ser 1.60 (H) 0.61 - 1.24 mg/dL   Calcium 10.0 8.9 - 10.3 mg/dL   GFR calc non Af Amer 42 (L) >60 mL/min   GFR calc Af Amer 48 (L) >60 mL/min   Anion gap 7 5 - 15  Protime-INR  Result Value Ref Range   Prothrombin Time 14.0 11.4 - 15.2 seconds   INR 1.1 0.8 - 1.2  Prepare Pheresed Platelets  Result Value Ref Range   Unit Number [id number]    Blood Component Type PLTP2 PSORALEN TREATED    Unit division 00    Status of Unit ISSUED    Transfusion Status      OK TO TRANSFUSE Performed at [facility], [address], [place], [place] [address]   ABO/Rh   Result Value Ref Range   ABO/RH(D)      AB POS Performed at [facility], [place], [place] [address]   [facility] Platelet Pheresis  Result Value Ref Range   ISSUE DATE / TIME [id number]    Blood Product Unit Number [id number]    PRODUCT CODE [id number]    Unit Type and Rh 5100    Blood Product Expiration Date [id number]    I personally reviewed labs  Imaging: CT Head: 1. Right-sided subdural hematoma and small amount of traumatic left parietal subarachnoid blood or possible parenchymal hemorrhagic contusion. No significant mass effect on the midline structures. 2. Sizable left posterior parietal scalp hematoma but no underlying skull fracture. 3. Age advanced cerebral atrophy, ventriculomegaly and periventricular white matter disease. 4. Advanced degenerative cervical spondylosis but no acute cervical spine fracture.  Impression/Plan:  Ms. Mastrogiacomo is here for evaluation of a right-sided subdural/epidural hematoma.  There is a small area of what appears to be traumatic subarachnoid hemorrhage on the left.  Given the acute hemorrhage, I did recommend reversal of his Plavix with platelets.  His blood pressure is well controlled at this point.  His neurologic exam is reassuring but given the size and location, I do think serial imaging is warranted.  Given we are unable to perform a cranial procedure if this should progress, I would recommend transferring to higher level facility.  This will allow her to be monitored and treated if necessary.   1.  Diagnosis: Right subdural hematoma  2.  Plan -Recommend platelet transfusion for Plavix use -Recommend blood pressure monitoring and keeping systolics less than 0000000 -He will need a repeat CT scan in 6 to 8 hours but recommend transfer to higher level facility to facilitate this

## 2020-05-19 ENCOUNTER — Encounter (INDEPENDENT_AMBULATORY_CARE_PROVIDER_SITE_OTHER): Payer: Medicare Other | Admitting: Ophthalmology

## 2020-10-06 ENCOUNTER — Encounter: Payer: Self-pay | Admitting: Emergency Medicine

## 2020-10-06 ENCOUNTER — Emergency Department: Payer: Medicare Other

## 2020-10-06 ENCOUNTER — Other Ambulatory Visit: Payer: Self-pay

## 2020-10-06 ENCOUNTER — Inpatient Hospital Stay
Admission: EM | Admit: 2020-10-06 | Discharge: 2020-10-11 | DRG: 388 | Disposition: A | Discharge status: Skilled Nursing Facility | Payer: Medicare Other | Attending: Internal Medicine | Admitting: Internal Medicine

## 2020-10-06 DIAGNOSIS — Z833 Family history of diabetes mellitus: Secondary | ICD-10-CM

## 2020-10-06 DIAGNOSIS — E1140 Type 2 diabetes mellitus with diabetic neuropathy, unspecified: Secondary | ICD-10-CM | POA: Diagnosis present

## 2020-10-06 DIAGNOSIS — K6389 Other specified diseases of intestine: Secondary | ICD-10-CM | POA: Diagnosis present

## 2020-10-06 DIAGNOSIS — Z86010 Personal history of colonic polyps: Secondary | ICD-10-CM | POA: Diagnosis not present

## 2020-10-06 DIAGNOSIS — Z7982 Long term (current) use of aspirin: Secondary | ICD-10-CM | POA: Diagnosis not present

## 2020-10-06 DIAGNOSIS — K56609 Unspecified intestinal obstruction, unspecified as to partial versus complete obstruction: Secondary | ICD-10-CM | POA: Diagnosis present

## 2020-10-06 DIAGNOSIS — Z7952 Long term (current) use of systemic steroids: Secondary | ICD-10-CM

## 2020-10-06 DIAGNOSIS — Z85528 Personal history of other malignant neoplasm of kidney: Secondary | ICD-10-CM | POA: Diagnosis not present

## 2020-10-06 DIAGNOSIS — I129 Hypertensive chronic kidney disease with stage 1 through stage 4 chronic kidney disease, or unspecified chronic kidney disease: Secondary | ICD-10-CM | POA: Diagnosis present

## 2020-10-06 DIAGNOSIS — R112 Nausea with vomiting, unspecified: Secondary | ICD-10-CM | POA: Diagnosis not present

## 2020-10-06 DIAGNOSIS — Z8673 Personal history of transient ischemic attack (TIA), and cerebral infarction without residual deficits: Secondary | ICD-10-CM

## 2020-10-06 DIAGNOSIS — E785 Hyperlipidemia, unspecified: Secondary | ICD-10-CM | POA: Diagnosis present

## 2020-10-06 DIAGNOSIS — Z7902 Long term (current) use of antithrombotics/antiplatelets: Secondary | ICD-10-CM

## 2020-10-06 DIAGNOSIS — Z940 Kidney transplant status: Secondary | ICD-10-CM

## 2020-10-06 DIAGNOSIS — Z7401 Bed confinement status: Secondary | ICD-10-CM | POA: Diagnosis not present

## 2020-10-06 DIAGNOSIS — Z79899 Other long term (current) drug therapy: Secondary | ICD-10-CM

## 2020-10-06 DIAGNOSIS — R410 Disorientation, unspecified: Secondary | ICD-10-CM | POA: Diagnosis not present

## 2020-10-06 DIAGNOSIS — Z794 Long term (current) use of insulin: Secondary | ICD-10-CM

## 2020-10-06 DIAGNOSIS — G9341 Metabolic encephalopathy: Secondary | ICD-10-CM

## 2020-10-06 DIAGNOSIS — E119 Type 2 diabetes mellitus without complications: Secondary | ICD-10-CM | POA: Diagnosis not present

## 2020-10-06 DIAGNOSIS — Z66 Do not resuscitate: Secondary | ICD-10-CM | POA: Diagnosis not present

## 2020-10-06 DIAGNOSIS — J690 Pneumonitis due to inhalation of food and vomit: Secondary | ICD-10-CM | POA: Diagnosis present

## 2020-10-06 DIAGNOSIS — D849 Immunodeficiency, unspecified: Secondary | ICD-10-CM | POA: Diagnosis not present

## 2020-10-06 DIAGNOSIS — Z515 Encounter for palliative care: Secondary | ICD-10-CM | POA: Diagnosis not present

## 2020-10-06 DIAGNOSIS — E1122 Type 2 diabetes mellitus with diabetic chronic kidney disease: Secondary | ICD-10-CM | POA: Diagnosis present

## 2020-10-06 DIAGNOSIS — I10 Essential (primary) hypertension: Secondary | ICD-10-CM | POA: Diagnosis not present

## 2020-10-06 DIAGNOSIS — Z7189 Other specified counseling: Secondary | ICD-10-CM | POA: Diagnosis not present

## 2020-10-06 DIAGNOSIS — T68XXXA Hypothermia, initial encounter: Secondary | ICD-10-CM | POA: Diagnosis not present

## 2020-10-06 DIAGNOSIS — K559 Vascular disorder of intestine, unspecified: Secondary | ICD-10-CM | POA: Diagnosis not present

## 2020-10-06 DIAGNOSIS — Z905 Acquired absence of kidney: Secondary | ICD-10-CM

## 2020-10-06 DIAGNOSIS — E872 Acidosis, unspecified: Secondary | ICD-10-CM

## 2020-10-06 DIAGNOSIS — G9340 Encephalopathy, unspecified: Secondary | ICD-10-CM | POA: Diagnosis not present

## 2020-10-06 DIAGNOSIS — B998 Other infectious disease: Secondary | ICD-10-CM | POA: Diagnosis not present

## 2020-10-06 DIAGNOSIS — Z888 Allergy status to other drugs, medicaments and biological substances status: Secondary | ICD-10-CM

## 2020-10-06 DIAGNOSIS — R6520 Severe sepsis without septic shock: Secondary | ICD-10-CM | POA: Diagnosis not present

## 2020-10-06 DIAGNOSIS — Z9049 Acquired absence of other specified parts of digestive tract: Secondary | ICD-10-CM

## 2020-10-06 DIAGNOSIS — K3189 Other diseases of stomach and duodenum: Secondary | ICD-10-CM | POA: Diagnosis present

## 2020-10-06 DIAGNOSIS — F0150 Vascular dementia without behavioral disturbance: Secondary | ICD-10-CM | POA: Diagnosis present

## 2020-10-06 DIAGNOSIS — Z20822 Contact with and (suspected) exposure to covid-19: Secondary | ICD-10-CM | POA: Diagnosis present

## 2020-10-06 DIAGNOSIS — Z91040 Latex allergy status: Secondary | ICD-10-CM

## 2020-10-06 DIAGNOSIS — R935 Abnormal findings on diagnostic imaging of other abdominal regions, including retroperitoneum: Secondary | ICD-10-CM | POA: Diagnosis not present

## 2020-10-06 DIAGNOSIS — A419 Sepsis, unspecified organism: Secondary | ICD-10-CM | POA: Diagnosis not present

## 2020-10-06 DIAGNOSIS — F0390 Unspecified dementia without behavioral disturbance: Secondary | ICD-10-CM | POA: Diagnosis not present

## 2020-10-06 LAB — LACTIC ACID, PLASMA
Lactic Acid, Venous: 1.5 mmol/L (ref 0.5–1.9)
Lactic Acid, Venous: 2.5 mmol/L (ref 0.5–1.9)

## 2020-10-06 LAB — CBC WITH DIFFERENTIAL/PLATELET
Abs Immature Granulocytes: 0.18 10*3/uL — ABNORMAL HIGH (ref 0.00–0.07)
Basophils Absolute: 0.1 10*3/uL (ref 0.0–0.1)
Basophils Relative: 0 %
Eosinophils Absolute: 0 10*3/uL (ref 0.0–0.5)
Eosinophils Relative: 0 %
HCT: 37.2 % — ABNORMAL LOW (ref 39.0–52.0)
Hemoglobin: 11.8 g/dL — ABNORMAL LOW (ref 13.0–17.0)
Immature Granulocytes: 1 %
Lymphocytes Relative: 7 %
Lymphs Abs: 1.9 10*3/uL (ref 0.7–4.0)
MCH: 25.9 pg — ABNORMAL LOW (ref 26.0–34.0)
MCHC: 31.7 g/dL (ref 30.0–36.0)
MCV: 81.8 fL (ref 80.0–100.0)
Monocytes Absolute: 1.5 10*3/uL — ABNORMAL HIGH (ref 0.1–1.0)
Monocytes Relative: 5 %
Neutro Abs: 24.7 10*3/uL — ABNORMAL HIGH (ref 1.7–7.7)
Neutrophils Relative %: 87 %
Platelets: 273 10*3/uL (ref 150–400)
RBC: 4.55 MIL/uL (ref 4.22–5.81)
RDW: 15.6 % — ABNORMAL HIGH (ref 11.5–15.5)
Smear Review: NORMAL
WBC: 28.3 10*3/uL — ABNORMAL HIGH (ref 4.0–10.5)
nRBC: 0 % (ref 0.0–0.2)

## 2020-10-06 LAB — URINALYSIS, COMPLETE (UACMP) WITH MICROSCOPIC
Bilirubin Urine: NEGATIVE
Glucose, UA: >=500 mg/dL — AB
Ketones, ur: NEGATIVE mg/dL
Nitrite: NEGATIVE
Protein, ur: 100 mg/dL — AB
RBC / HPF: >50 RBC/hpf — ABNORMAL HIGH (ref 0–5)
Specific Gravity, Urine: 1.016 (ref 1.005–1.030)
pH: 6 (ref 5.0–8.0)

## 2020-10-06 LAB — LIPASE, BLOOD: Lipase: 25 U/L (ref 11–51)

## 2020-10-06 LAB — TROPONIN I (HIGH SENSITIVITY)
Troponin I (High Sensitivity): 23 ng/L — ABNORMAL HIGH (ref <18)
Troponin I (High Sensitivity): 23 ng/L — ABNORMAL HIGH (ref <18)

## 2020-10-06 LAB — COMPREHENSIVE METABOLIC PANEL
ALT: 13 U/L (ref 0–44)
AST: 23 U/L (ref 15–41)
Albumin: 3.3 g/dL — ABNORMAL LOW (ref 3.5–5.0)
Alkaline Phosphatase: 65 U/L (ref 38–126)
Anion gap: 11 (ref 5–15)
BUN: 40 mg/dL — ABNORMAL HIGH (ref 8–23)
CO2: 30 mmol/L (ref 22–32)
Calcium: 9.4 mg/dL (ref 8.9–10.3)
Chloride: 94 mmol/L — ABNORMAL LOW (ref 98–111)
Creatinine, Ser: 1.14 mg/dL (ref 0.61–1.24)
GFR, Estimated: >60 mL/min (ref >60)
Glucose, Bld: 208 mg/dL — ABNORMAL HIGH (ref 70–99)
Potassium: 4.3 mmol/L (ref 3.5–5.1)
Sodium: 135 mmol/L (ref 135–145)
Total Bilirubin: 0.9 mg/dL (ref 0.3–1.2)
Total Protein: 7.3 g/dL (ref 6.5–8.1)

## 2020-10-06 LAB — CBG MONITORING, ED
Glucose-Capillary: 226 mg/dL — ABNORMAL HIGH (ref 70–99)
Glucose-Capillary: 229 mg/dL — ABNORMAL HIGH (ref 70–99)

## 2020-10-06 LAB — RESP PANEL BY RT-PCR (FLU A&B, COVID) ARPGX2
Influenza A by PCR: NEGATIVE
Influenza B by PCR: NEGATIVE
SARS Coronavirus 2 by RT PCR: NEGATIVE

## 2020-10-06 LAB — HEMOGLOBIN A1C
Hgb A1c MFr Bld: 7.4 % — ABNORMAL HIGH (ref 4.8–5.6)
Mean Plasma Glucose: 165.68 mg/dL

## 2020-10-06 LAB — GLUCOSE, CAPILLARY: Glucose-Capillary: 222 mg/dL — ABNORMAL HIGH (ref 70–99)

## 2020-10-06 MED ORDER — ONDANSETRON HCL 4 MG/2ML IJ SOLN
4.0000 mg | Freq: Once | INTRAMUSCULAR | Status: AC
  Administered 2020-10-06: 4 mg via INTRAVENOUS
  Filled 2020-10-06: qty 2

## 2020-10-06 MED ORDER — LACTATED RINGERS IV BOLUS
1000.0000 mL | Freq: Once | INTRAVENOUS | Status: AC
  Administered 2020-10-06: 1000 mL via INTRAVENOUS

## 2020-10-06 MED ORDER — HYDRALAZINE HCL 20 MG/ML IJ SOLN
INTRAMUSCULAR | Status: AC
  Administered 2020-10-06: 10 mg via INTRAVENOUS
  Filled 2020-10-06: qty 1

## 2020-10-06 MED ORDER — ENOXAPARIN SODIUM 40 MG/0.4ML ~~LOC~~ SOLN
40.0000 mg | SUBCUTANEOUS | Status: DC
  Administered 2020-10-06 – 2020-10-11 (×6): 40 mg via SUBCUTANEOUS
  Filled 2020-10-06 (×5): qty 0.4

## 2020-10-06 MED ORDER — MORPHINE SULFATE (PF) 2 MG/ML IV SOLN: 2.0000 mg | INTRAVENOUS | Status: DC | PRN

## 2020-10-06 MED ORDER — HYDRALAZINE HCL 20 MG/ML IJ SOLN
10.0000 mg | Freq: Four times a day (QID) | INTRAMUSCULAR | Status: DC | PRN
  Administered 2020-10-08 – 2020-10-09 (×2): 10 mg via INTRAVENOUS
  Filled 2020-10-06 (×2): qty 1

## 2020-10-06 MED ORDER — INSULIN ASPART 100 UNIT/ML ~~LOC~~ SOLN
SUBCUTANEOUS | Status: AC
  Administered 2020-10-06: 5 [IU] via SUBCUTANEOUS
  Filled 2020-10-06: qty 1

## 2020-10-06 MED ORDER — SODIUM CHLORIDE 0.9 % IV SOLN
3.0000 g | Freq: Once | INTRAVENOUS | Status: AC
  Administered 2020-10-06: 3 g via INTRAVENOUS
  Filled 2020-10-06: qty 8

## 2020-10-06 MED ORDER — METHYLPREDNISOLONE SODIUM SUCC 40 MG IJ SOLR
20.0000 mg | Freq: Every day | INTRAMUSCULAR | Status: DC
  Administered 2020-10-07: 09:00:00 20 mg via INTRAVENOUS
  Filled 2020-10-06: qty 1

## 2020-10-06 MED ORDER — INSULIN ASPART 100 UNIT/ML ~~LOC~~ SOLN
0.0000 [IU] | SUBCUTANEOUS | Status: DC
  Administered 2020-10-06 (×2): 5 [IU] via SUBCUTANEOUS
  Administered 2020-10-07: 03:00:00 3 [IU] via SUBCUTANEOUS
  Administered 2020-10-07: 2 [IU] via SUBCUTANEOUS
  Administered 2020-10-07: 17:00:00 3 [IU] via SUBCUTANEOUS
  Administered 2020-10-07 – 2020-10-09 (×4): 2 [IU] via SUBCUTANEOUS
  Administered 2020-10-09 – 2020-10-10 (×5): 3 [IU] via SUBCUTANEOUS
  Administered 2020-10-11: 05:00:00 2 [IU] via SUBCUTANEOUS
  Filled 2020-10-06 (×14): qty 1

## 2020-10-06 MED ORDER — ONDANSETRON HCL 4 MG PO TABS: 4.0000 mg | ORAL_TABLET | Freq: Four times a day (QID) | ORAL | Status: DC | PRN

## 2020-10-06 MED ORDER — PANTOPRAZOLE SODIUM 40 MG IV SOLR
40.0000 mg | INTRAVENOUS | Status: DC
  Administered 2020-10-06 – 2020-10-11 (×6): 40 mg via INTRAVENOUS
  Filled 2020-10-06 (×5): qty 40

## 2020-10-06 MED ORDER — ONDANSETRON HCL 4 MG/2ML IJ SOLN: 4.0000 mg | Freq: Four times a day (QID) | INTRAMUSCULAR | Status: DC | PRN

## 2020-10-06 MED ORDER — SODIUM CHLORIDE 0.9 % IV SOLN: INTRAVENOUS | Status: DC

## 2020-10-06 MED ORDER — IOHEXOL 300 MG/ML  SOLN
100.0000 mL | Freq: Once | INTRAMUSCULAR | Status: DC | PRN
  Filled 2020-10-06: qty 100

## 2020-10-06 MED ORDER — METHYLPREDNISOLONE SODIUM SUCC 125 MG IJ SOLR
INTRAMUSCULAR | Status: AC
  Administered 2020-10-06: 40 mg
  Filled 2020-10-06: qty 2

## 2020-10-06 MED ORDER — SODIUM CHLORIDE 0.9 % IV SOLN
3.0000 g | Freq: Four times a day (QID) | INTRAVENOUS | Status: DC
  Administered 2020-10-06 – 2020-10-10 (×18): 3 g via INTRAVENOUS
  Filled 2020-10-06 (×2): qty 8
  Filled 2020-10-06: qty 3
  Filled 2020-10-06: qty 8
  Filled 2020-10-06: qty 3
  Filled 2020-10-06: qty 8
  Filled 2020-10-06: qty 3
  Filled 2020-10-06: qty 8
  Filled 2020-10-06: qty 3
  Filled 2020-10-06: qty 8
  Filled 2020-10-06 (×2): qty 3
  Filled 2020-10-06: qty 8
  Filled 2020-10-06 (×2): qty 3
  Filled 2020-10-06 (×2): qty 8
  Filled 2020-10-06 (×2): qty 3
  Filled 2020-10-06: qty 8
  Filled 2020-10-06 (×2): qty 3
  Filled 2020-10-06: qty 8

## 2020-10-06 NOTE — ED Triage Notes (Signed)
Pt to ED via [facility] from [facility]. EMS stated that his last know normal was 2300 on 10/07/20. Pt started to guard RLQ of  his abd and also vomiting. Pt had abnormal labs from last physician visit and was sent to [facility]. Facility report to EMS ileus and partial blockage.

## 2020-10-06 NOTE — Consult Note (Signed)
Pharmacy Antibiotic Note  James Hicks is a 74 y.o. male admitted on 10/06/2020 with aspiration pneumonia.  Pharmacy has been consulted for unasyn dosing. CT abdomen c/f LLL aspiration PNA and known recent emesis with ongoing further w/u for bowel obstruction or other source abd'l pain. Current leukocytosis WBC 28.3, afeb, VSS. 12/9 BCx sent/pending.  Plan: Initiate Unasyn 3g IV q6h for aspiration event. Monitor CBC, cultures, and for clinical signs of infectious course.  Height: 6\' 3"  (190.5 cm) Weight: 72.6 kg (160 lb) IBW/kg (Calculated) : 84.5  Temp (24hrs), Avg:99 F (37.2 C), Min:98.8 F (37.1 C), Max:99.1 F (37.3 C)  Recent Labs  Lab 10/06/20 0250 10/06/20 0316 10/06/20 0754  WBC  --  28.3*  --   CREATININE 1.14  --   --   LATICACIDVEN  --   --  1.5    Estimated Creatinine Clearance: 57.5 mL/min (by C-G formula based on SCr of 1.14 mg/dL).    Allergies  Allergen Reactions  . Hylan G-F 20 Anaphylaxis    Other reaction(s): Other (See Comments), Unknown Other reaction(s): Unknown Other reaction(s): Unknown Other reaction(s): Unknown   . Latex Hives  . Other Anaphylaxis and Other (See Comments)    IBAND IBAND IBAND IBAND     Antimicrobials this admission: unasyn 3g q6h (12/9>>  Dose adjustments this admission: N/A  Microbiology results: 12/9 BCx: sent   Thank you for allowing pharmacy to be a part of this patient's care.  Shanon Brow Shaelee Forni 10/06/2020 9:51 AM

## 2020-10-06 NOTE — H&P (Signed)
History and Physical    James Hicks ZES:[id number] DOB: [birth year] DOA: 10/06/2020  PCP: Derinda Late, MD   Patient coming from: Skilled nursing facility  I have personally briefly reviewed patient's old medical records in [place] Most of the history was obtained from his guardian at the bedside  Chief Complaint: Abdominal pain  HPI: James Hicks is a 74 y.o. male with medical history significant for CVA who is bedbound and nonverbal, history of renal transplant on immunosuppressive therapy, diabetes mellitus, hypertension and dyslipidemia who was sent from the skilled nursing facility where he resides to the emergency room for evaluation of abdominal pain.  Per nursing home staff patient was noted to guard his right lower quadrant and had some emesis.  There was a concern about [place] bowel obstruction and patient was said to have labs drawn at the facility which came back abnormal.  Per skilled nursing facility, he had a white count of 18,000 and an abnormal KUB. I am unable to do a review of systems on this patient since he is nonverbal Labs show sodium 135, potassium 4.3, chloride 94, bicarb 30, glucose 208, BUN 40, creatinine 1.14, calcium 9.4, alkaline phosphatase 65, albumin 3.3, lipase 25, AST 23, ALT 13, total protein 7.3, troponin XX 3, lactic acid 1.5, white count 28.3, hemoglobin 11.8, hematocrit 37.2, MCV 81.8, RDW 15.6, platelet count 273 CT scan of abdomen and pelvis shows distended stomach with diffuse pneumatosis tracking superiorly along the esophagus. The pneumatosis could be benign (from distension and vomiting) or an ischemic manifestation. Distended proximal small bowel loops without fluid level, occult obstruction versus gastroenteritis. Left lower lobe airspace disease, presumed aspiration. Twelve-lead EKG shows sinus tachycardia with LVH  ED Course: Patient is a 74 year old African-American male who was sent to the ER from the skilled nursing facility where he  resides for evaluation of abdominal pain and emesis concerning for small bowel obstruction.  CT scan of abdomen and pelvis shows a distended stomach with diffuse pneumatosis and distended proximal small bowel loops without fluid level.  He also has a left lower lobe pneumonia presumed to be secondary to aspiration.  Surgery was consulted in the ER and patient will be admitted to the hospital for evaluation.  Review of Systems: As per HPI otherwise 10 point review of systems negative.    Past Medical History:  Diagnosis Date  . Cholelithiasis   . Chronic renal failure    kidney transplant   . Colon polyps   . CVA (cerebral infarction)   . Degenerative joint disease   . Diabetes mellitus without complication ([place])   . Diabetic neuropathy ([place])   . Hydronephrosis of right kidney   . Hyperlipidemia   . Hypertension   . Renal cell carcinoma ([place])   . Stroke [facility])     Past Surgical History:  Procedure Laterality Date  . CATARACT EXTRACTION    . CHOLECYSTECTOMY    . COLONOSCOPY    . COLONOSCOPY WITH PROPOFOL N/A 05/28/2018   Procedure: COLONOSCOPY WITH PROPOFOL;  Surgeon: Toledo, Benay Pike, MD;  Location: [facility] ENDOSCOPY;  Service: Gastroenterology;  Laterality: N/A;  . KIDNEY TRANSPLANT    . KNEE ARTHROSCOPY    . NEPHRECTOMY    . TONSILLECTOMY    . WISDOM TOOTH EXTRACTION       reports that he has never smoked. He has never used smokeless tobacco. He reports previous alcohol use. He reports previous drug use.  Allergies  Allergen Reactions  . Hylan G-F 20  Anaphylaxis    Other reaction(s): Other (See Comments), Unknown Other reaction(s): Unknown Other reaction(s): Unknown Other reaction(s): Unknown   . Latex Hives  . Other Anaphylaxis and Other (See Comments)    IBAND IBAND IBAND IBAND     Family History  Problem Relation Age of Onset  . Colon cancer Mother   . Diabetes Mother   . Breast cancer Sister      Prior to Admission medications   Medication Sig Start  Date End Date Taking? Authorizing Provider  allopurinol (ZYLOPRIM) 100 MG tablet Take 100 mg by mouth daily.   Yes [provider]  atorvastatin (LIPITOR) 10 MG tablet Take 10 mg by mouth daily.   Yes [provider]  calcium-vitamin D (OSCAL WITH D) 500-200 MG-UNIT tablet Take 1 tablet by mouth 2 (two) times daily.   Yes [provider]  DEPAKOTE SPRINKLES 125 MG capsule Take 125 mg by mouth 2 (two) times daily. 09/26/20  Yes [provider]  diclofenac Sodium (VOLTAREN) 1 % GEL Apply 2 g topically 4 (four) times daily. To right shoulder and lower back   Yes [provider]  DULCOLAX 5 MG EC tablet Take 5 mg by mouth 2 (two) times daily as needed. 09/26/20  Yes [provider]  FLOMAX 0.4 MG CAPS capsule Take 0.8 mg by mouth at bedtime. 09/26/20  Yes [provider]  hydrochlorothiazide (HYDRODIURIL) 25 MG tablet Take 25 mg by mouth daily.   Yes [provider]  insulin glargine (LANTUS) 100 UNIT/ML injection Inject 15 Units into the skin daily.   Yes [provider]  insulin lispro (HUMALOG) 100 UNIT/ML injection Inject 6 Units into the skin 3 (three) times daily. 0730, 1130, 1630   Yes [provider]  MELATONIN MAXIMUM STRENGTH 5 MG TABS Take 5 mg by mouth at bedtime. 09/26/20  Yes [provider]  metoprolol succinate (TOPROL-XL) 25 MG 24 hr tablet Take 25 mg by mouth daily. 07/31/18  Yes [provider]  mycophenolate (CELLCEPT) 500 MG tablet Take 500 mg by mouth 2 (two) times daily.   Yes [provider]  NIFEdipine (PROCARDIA-XL/NIFEDICAL-XL) 30 MG 24 hr tablet Take 30 mg by mouth daily.   Yes [provider]  predniSONE (DELTASONE) 10 MG tablet Take 1 tablet (10 mg total) by mouth daily. 07/05/19  Yes Cuthriell, Charline Bills, PA-C  SENNA PLUS 8.6-50 MG tablet Take 1 tablet by mouth daily. 09/26/20  Yes [provider]  SEROQUEL 25 MG tablet Take 25 mg by mouth  daily. 09/26/20  Yes [provider]  SEROQUEL 50 MG tablet Take 100 mg by mouth at bedtime. 09/26/20  Yes [provider]  sodium bicarbonate 650 MG tablet Take 650 mg by mouth 2 (two) times daily. 09/26/20  Yes [provider]  tacrolimus (PROGRAF) 1 MG capsule Take 5 mg by mouth daily. 0900   Yes [provider]  tacrolimus (PROGRAF) 1 MG capsule Take 6 mg by mouth at bedtime. 2100   Yes [provider]  traZODone (DESYREL) 50 MG tablet Take 100 mg by mouth at bedtime. 09/26/20  Yes [provider]  TYLENOL 325 MG tablet Take 650 mg by mouth every 6 (six) hours as needed. 09/26/20  Yes [provider]  aspirin EC 81 MG tablet Take 81 mg by mouth daily. Patient not taking: No sig reported    [provider]  clopidogrel (PLAVIX) 75 MG tablet Take 75 mg by mouth daily. Patient not taking:  No sig reported    [provider]  colchicine 0.6 MG tablet Take 0.6 mg by mouth daily. Patient not taking: Reported on 10/06/2020    [provider]  enalapril (VASOTEC) 20 MG tablet Take 20 mg by mouth daily. Patient not taking: Reported on 10/06/2020    [provider]  furosemide (LASIX) 20 MG tablet Take 20 mg by mouth 2 (two) times daily. Patient not taking: Reported on 10/06/2020    [provider]  HYDROcodone-acetaminophen (NORCO/VICODIN) 5-325 MG tablet Take 1 tablet by mouth every 4 (four) hours as needed for moderate pain. Patient not taking: Reported on 10/06/2020 07/05/19   Cuthriell, Charline Bills, PA-C  insulin aspart (NOVOLOG) 100 UNIT/ML injection Inject 1-7 Units into the skin 4 (four) times daily. Per Sliding Scale ( 150-200: 1u, 201-249: 2u, 250-299: 3u, 300-349: 4u, 350-399: 5u, If greater than 399: 7u. 09/26/20   [provider]  timolol (TIMOPTIC) 0.25 % ophthalmic solution Place 1 drop into both eyes 2 (two) times daily.  Patient not taking: Reported on 10/06/2020    [provider]  traMADol (ULTRAM) 50 MG tablet Take 1 tablet (50 mg total) by mouth every 6 (six) hours as needed. Patient not taking: Reported on 10/06/2020 08/06/19   Versie Starks, PA-C    Physical Exam: Vitals:   10/06/20 0600 10/06/20 0700 10/06/20 0730 10/06/20 0830  BP: (!) 148/84 (!) 149/79 (!) 146/77 (!) 141/86  Pulse: 95 99 99 95  Resp: (!) 26 (!) 25 (!) 29 (!) 29  Temp:    99.1 F (37.3 C)  TempSrc:    Rectal  SpO2: 100% 100% 100% 100%  Weight:      Height:         Vitals:   10/06/20 0600 10/06/20 0700 10/06/20 0730 10/06/20 0830  BP: (!) 148/84 (!) 149/79 (!) 146/77 (!) 141/86  Pulse: 95 99 99 95  Resp: (!) 26 (!) 25 (!) 29 (!) 29  Temp:    99.1 F (37.3 C)  TempSrc:    Rectal  SpO2: 100% 100% 100% 100%  Weight:      Height:        Constitutional: NAD, lethargic but arouses to name call Eyes: PERRL, lids and conjunctivae pallor ENMT: Mucous membranes are dry  Neck: normal, supple, no masses, no thyromegaly Respiratory: Rhonchi left base, no wheezing, no crackles. Normal respiratory effort. No accessory muscle use.  Cardiovascular: Tachycardic, no murmurs / rubs / gallops. No extremity edema. 2+ pedal pulses. No carotid bruits.  Abdomen: Diffusely tender, distended, no masses palpated. No hepatosplenomegaly. Bowel sounds positive.  Musculoskeletal: no clubbing / cyanosis. No joint deformity upper and lower extremities.  Skin: no rashes, lesions, ulcers.  Neurologic: Unable to assess. Patient is bedbound Psychiatric: Normal mood and affect.   Labs on Admission: I have personally reviewed following labs and imaging studies  CBC: Recent Labs  Lab 10/06/20 0316  WBC 28.3*  NEUTROABS 24.7*  HGB 11.8*  HCT 37.2*  MCV 81.8  PLT 295   Basic Metabolic Panel: Recent Labs  Lab 10/06/20 0250  NA 135  K 4.3  CL 94*  CO2 30  GLUCOSE 208*  BUN 40*  CREATININE 1.14  CALCIUM 9.4   GFR: Estimated Creatinine Clearance: 57.5 mL/min (by C-G formula based  on SCr of 1.14 mg/dL). Liver Function Tests: Recent Labs  Lab 10/06/20 0250  AST 23  ALT 13  ALKPHOS 65  BILITOT 0.9  PROT 7.3  ALBUMIN 3.3*  Recent Labs  Lab 10/06/20 0250  LIPASE 25   No results for input(s): AMMONIA in the last 168 hours. Coagulation Profile: No results for input(s): INR, PROTIME in the last 168 hours. Cardiac Enzymes: No results for input(s): CKTOTAL, CKMB, CKMBINDEX, TROPONINI in the last 168 hours. BNP (last 3 results) No results for input(s): PROBNP in the last 8760 hours. HbA1C: No results for input(s): HGBA1C in the last 72 hours. CBG: No results for input(s): GLUCAP in the last 168 hours. Lipid Profile: No results for input(s): CHOL, HDL, LDLCALC, TRIG, CHOLHDL, LDLDIRECT in the last 72 hours. Thyroid Function Tests: No results for input(s): TSH, T4TOTAL, FREET4, T3FREE, THYROIDAB in the last 72 hours. Anemia Panel: No results for input(s): VITAMINB12, FOLATE, FERRITIN, TIBC, IRON, RETICCTPCT in the last 72 hours. Urine analysis:    Component Value Date/Time   COLORURINE YELLOW (A) 08/13/2018 1115   APPEARANCEUR CLEAR (A) 08/13/2018 1115   LABSPEC 1.013 08/13/2018 1115   PHURINE 6.0 08/13/2018 1115   GLUCOSEU 150 (A) 08/13/2018 1115   HGBUR NEGATIVE 08/13/2018 1115   BILIRUBINUR NEGATIVE 08/13/2018 1115   KETONESUR NEGATIVE 08/13/2018 1115   PROTEINUR NEGATIVE 08/13/2018 1115   NITRITE NEGATIVE 08/13/2018 1115   LEUKOCYTESUR NEGATIVE 08/13/2018 1115    Radiological Exams on Admission: CT ABDOMEN PELVIS WO CONTRAST  Result Date: 10/06/2020 CLINICAL DATA:  Unspecified abdominal pain.  Vomiting. EXAM: CT ABDOMEN AND PELVIS WITHOUT CONTRAST TECHNIQUE: Multidetector CT imaging of the abdomen and pelvis was performed following the standard protocol without IV contrast. COMPARISON:  None similar and available FINDINGS: Lower chest: Airspace disease in the left lower lobe with superimposed atelectasis and volume loss. Extensive coronary  atherosclerosis. Hepatobiliary: No focal liver abnormality.Cholecystectomy. Pancreas: Generalized atrophy Spleen: Unremarkable. Adrenals/Urinary Tract: Negative adrenals. Bilateral nephrectomy. There is history of renal cell carcinoma. Normal size transplant kidney in the left pelvis. Unremarkable bladder. Stomach/Bowel: Very distended stomach with fluid levels and generalized pneumatosis that tracks superiorly along the lower esophagus. Multiple fluid and gas-filled small bowel loops without definite transition point, limited due to streak artifact and lack of oral contrast. Vascular/Lymphatic: Extensive atheromatous calcification. No mass or adenopathy. Reproductive:No pathologic findings. Other: No ascites or pneumoperitoneum. Musculoskeletal: No acute abnormalities. Advanced generalized spondylosis and facet spurring. IMPRESSION: 1. Distended stomach with diffuse pneumatosis tracking superiorly along the esophagus. The pneumatosis could be benign (from distension and vomiting) or an ischemic manifestation. 2. Distended proximal small bowel loops without fluid level, occult obstruction versus gastroenteritis. 3. Left lower lobe airspace disease, presumed aspiration. Electronically Signed   By: Monte Fantasia M.D.   On: 10/06/2020 05:20    EKG: Independently reviewed.  Sinus tachycardia  Assessment/Plan Principal Problem:   SBO (small bowel obstruction) (HCC) Active Problems:   Diabetes mellitus without complication (HCC)   Hypertension   History of renal transplant   Aspiration pneumonia ([place])    Small bowel obstruction Unclear etiology Patient presents for evaluation of abdominal pain and emesis with a distended abdomen on physical exam Imaging shows distended stomach with diffuse pneumatosis tracking superiorly along the esophagus.  Pneumatosis could be benign from distention and vomiting or any ischemic manifestation.  Distended proximal small bowel loops without fluid levels. Lactic acid  is 1.5 We will keep patient n.p.o. NG tube decompression We will request surgery consult Antiemetics and IV PPI    Aspiration pneumonia Patient noted to have a left lower lobe infiltrate presumed to be secondary to aspiration Patient has marked leukocytosis of 28,000 with a left shift We will  place patient on Unasyn Follow-up results of blood cultures   Diabetes mellitus We will keep patient n.p.o. for now IV fluid hydration Blood sugar checks every 4 hours   Hypertension Hold oral antihypertensive medications IV hydralazine for systolic blood pressure > 147mmHg    Status post renal transplant On immunosuppressive therapy We will place patient on IV methylprednisolone in place of oral prednisone Will hold Prograf and CellCept for now since patient needs gastric decompression   DVT prophylaxis: Lovenox Code Status: Full code Family Communication: Greater than 50% of time was spent discussing patient's condition and plan of care with his wife who is at the bedside.  All questions and concerns have been addressed.  She verbalizes understanding and agrees with the plan.  CODE STATUS was discussed and patient is a full code. Disposition Plan: Back to previous home environment Consults called: Surgery    Nason Conradt MD [facility]     10/06/2020, 9:19 AM

## 2020-10-06 NOTE — ED Notes (Signed)
Pharmacy called for missing unasyn dose. Per Juanda Crumble in pharmacy, someone is coming to [place] ED pyxis at this time, and will send unasyn if not in restock.

## 2020-10-06 NOTE — Consult Note (Signed)
[facility] SURGICAL CONSULTATION NOTE (initial) - cpt: 20254   HISTORY OF PRESENT ILLNESS (HPI):  74 y.o. male presented to [facility] ED overnight for evaluation of abdominal pain. Patient has a history of dementia and comes in from [facility]. Patient's wife at bedside helps provide the history. She noted that he had the acute onset of reported abdominal pain, multiple episodes of emesis, and multiple loose bowel movements starting around 11PM last night. She was called around 2AM regarding this. He reportedly had labs at his facility showing leukocytosis and KUB concerning for obstruction. He has had numerous abdominal surgeries in the past. His wife does not believe he has ever had any similar in the past. Work up in the ED did show leukocytosis to 28.3K, sCr - normal at 1.14, slight elevation in high-sensitivity troponin to 23, normal lactic acid level at 1.5, and CT Abdomen/Pelvis was concerning for marked distension of his stomach with dependent pneumatosis and small bowel dilation. He was admitted to medicine service.   Surgery is consulted by emergency medicine physician Dr. Rudene Re, MD in this context for evaluation and management of possible SBO and gastric pneumatosis.   PAST MEDICAL HISTORY (PMH):  Past Medical History:  Diagnosis Date  . Cholelithiasis   . Chronic renal failure    kidney transplant   . Colon polyps   . CVA (cerebral infarction)   . Degenerative joint disease   . Diabetes mellitus without complication ([place])   . Diabetic neuropathy ([place])   . Hydronephrosis of right kidney   . Hyperlipidemia   . Hypertension   . Renal cell carcinoma ([place])   . Stroke [facility]      PAST SURGICAL HISTORY ([place]):  Past Surgical History:  Procedure Laterality Date  . CATARACT EXTRACTION    . CHOLECYSTECTOMY    . COLONOSCOPY    . COLONOSCOPY WITH PROPOFOL N/A 05/28/2018   Procedure: COLONOSCOPY WITH PROPOFOL;  Surgeon: Toledo, Benay Pike, MD;  Location:  [facility] ENDOSCOPY;  Service: Gastroenterology;  Laterality: N/A;  . KIDNEY TRANSPLANT    . KNEE ARTHROSCOPY    . NEPHRECTOMY    . TONSILLECTOMY    . WISDOM TOOTH EXTRACTION       MEDICATIONS:  Prior to Admission medications   Medication Sig Start Date End Date Taking? Authorizing Provider  allopurinol (ZYLOPRIM) 100 MG tablet Take 100 mg by mouth daily.   Yes [provider]  atorvastatin (LIPITOR) 10 MG tablet Take 10 mg by mouth daily.   Yes [provider]  calcium-vitamin D (OSCAL WITH D) 500-200 MG-UNIT tablet Take 1 tablet by mouth 2 (two) times daily.   Yes [provider]  DEPAKOTE SPRINKLES 125 MG capsule Take 125 mg by mouth 2 (two) times daily. 09/26/20  Yes [provider]  diclofenac Sodium (VOLTAREN) 1 % GEL Apply 2 g topically 4 (four) times daily. To right shoulder and lower back   Yes [provider]  DULCOLAX 5 MG EC tablet Take 5 mg by mouth 2 (two) times daily as needed. 09/26/20  Yes [provider]  FLOMAX 0.4 MG CAPS capsule Take 0.8 mg by mouth at bedtime. 09/26/20  Yes [provider]  hydrochlorothiazide (HYDRODIURIL) 25 MG tablet Take 25 mg by mouth daily.   Yes [provider]  insulin glargine (LANTUS) 100 UNIT/ML injection Inject 15 Units into the skin daily.   Yes [provider]  insulin lispro (HUMALOG) 100 UNIT/ML injection Inject 6 Units into the skin 3 (three)  times daily. 0730, 1130, 1630   Yes [provider]  MELATONIN MAXIMUM STRENGTH 5 MG TABS Take 5 mg by mouth at bedtime. 09/26/20  Yes [provider]  metoprolol succinate (TOPROL-XL) 25 MG 24 hr tablet Take 25 mg by mouth daily. 07/31/18  Yes [provider]  mycophenolate (CELLCEPT) 500 MG tablet Take 500 mg by mouth 2 (two) times daily.   Yes [provider]  NIFEdipine (PROCARDIA-XL/NIFEDICAL-XL) 30 MG 24 hr tablet Take 30 mg by mouth daily.   Yes [provider]  predniSONE  (DELTASONE) 10 MG tablet Take 1 tablet (10 mg total) by mouth daily. 07/05/19  Yes Cuthriell, Charline Bills, PA-C  SENNA PLUS 8.6-50 MG tablet Take 1 tablet by mouth daily. 09/26/20  Yes [provider]  SEROQUEL 25 MG tablet Take 25 mg by mouth daily. 09/26/20  Yes [provider]  SEROQUEL 50 MG tablet Take 100 mg by mouth at bedtime. 09/26/20  Yes [provider]  sodium bicarbonate 650 MG tablet Take 650 mg by mouth 2 (two) times daily. 09/26/20  Yes [provider]  tacrolimus (PROGRAF) 1 MG capsule Take 5 mg by mouth daily. 0900   Yes [provider]  tacrolimus (PROGRAF) 1 MG capsule Take 6 mg by mouth at bedtime. 2100   Yes [provider]  traZODone (DESYREL) 50 MG tablet Take 100 mg by mouth at bedtime. 09/26/20  Yes [provider]  TYLENOL 325 MG tablet Take 650 mg by mouth every 6 (six) hours as needed. 09/26/20  Yes [provider]  aspirin EC 81 MG tablet Take 81 mg by mouth daily. Patient not taking: No sig reported    [provider]  clopidogrel (PLAVIX) 75 MG tablet Take 75 mg by mouth daily. Patient not taking: No sig reported    [provider]  colchicine 0.6 MG tablet Take 0.6 mg by mouth daily. Patient not taking: Reported on 10/06/2020    [provider]  enalapril (VASOTEC) 20 MG tablet Take 20 mg by mouth daily. Patient not taking: Reported on 10/06/2020    [provider]  furosemide (LASIX) 20 MG tablet Take 20 mg by mouth 2 (two) times daily. Patient not taking: Reported on 10/06/2020    [provider]  HYDROcodone-acetaminophen (NORCO/VICODIN) 5-325 MG tablet Take 1 tablet by mouth every 4 (four) hours as needed for moderate pain. Patient not taking: Reported on 10/06/2020 07/05/19   Cuthriell, Charline Bills, PA-C  insulin aspart (NOVOLOG) 100 UNIT/ML injection Inject 1-7 Units into the skin 4 (four) times daily. Per Sliding Scale ( 150-200: 1u, 201-249: 2u,  250-299: 3u, 300-349: 4u, 350-399: 5u, If greater than 399: 7u. 09/26/20   [provider]  timolol (TIMOPTIC) 0.25 % ophthalmic solution Place 1 drop into both eyes 2 (two) times daily.  Patient not taking: Reported on 10/06/2020    [provider]  traMADol (ULTRAM) 50 MG tablet Take 1 tablet (50 mg total) by mouth every 6 (six) hours as needed. Patient not taking: Reported on 10/06/2020 08/06/19   Versie Starks, PA-C     ALLERGIES:  Allergies  Allergen Reactions  . Hylan G-F 20 Anaphylaxis    Other reaction(s): Other (See Comments), Unknown Other reaction(s): Unknown Other reaction(s): Unknown Other reaction(s): Unknown   . Latex Hives  . Other Anaphylaxis and Other (See Comments)    IBAND IBAND IBAND IBAND      SOCIAL HISTORY:  Social History   Socioeconomic History  .  Marital status: Married    Spouse name: Not on file  . Number of children: Not on file  . Years of education: Not on file  . Highest education level: Not on file  Occupational History  . Not on file  Tobacco Use  . Smoking status: Never Smoker  . Smokeless tobacco: Never Used  Vaping Use  . Vaping Use: Never used  Substance and Sexual Activity  . Alcohol use: Not Currently  . Drug use: Not Currently  . Sexual activity: Not on file    Comment: Married   Other Topics Concern  . Not on file  Social History Narrative  . Not on file   Social Determinants of Health   Financial Resource Strain: Not on file  Food Insecurity: Not on file  Transportation Needs: Not on file  Physical Activity: Not on file  Stress: Not on file  Social Connections: Not on file  Intimate Partner Violence: Not on file     FAMILY HISTORY:  Family History  Problem Relation Age of Onset  . Colon cancer Mother   . Diabetes Mother   . Breast cancer Sister       REVIEW OF SYSTEMS:  Review of Systems  Unable to perform ROS: Dementia  Gastrointestinal: Positive for abdominal pain, diarrhea and  vomiting.    VITAL SIGNS:  Temp:  [98.8 F (37.1 C)] 98.8 F (37.1 C) (12/09 0241) Pulse Rate:  [95-99] 99 (12/09 0700) Resp:  [18-26] 25 (12/09 0700) BP: (144-149)/(74-84) 149/79 (12/09 0700) SpO2:  [99 %-100 %] 100 % (12/09 0700) Weight:  [72.6 kg] 72.6 kg (12/09 0243)     Height: 6\' 3"  (190.5 cm) Weight: 72.6 kg BMI (Calculated): 20   INTAKE/OUTPUT:  No intake/output data recorded.  PHYSICAL EXAM:  Physical Exam Constitutional:      General: He is not in acute distress.    Appearance: He is well-developed and normal weight. He is not ill-appearing.     Comments: Patient resting comfortably, briefly arouses, essentially non-verbal, wife at bedside   HENT:     Head: Normocephalic and atraumatic.  Eyes:     General: No scleral icterus.    Extraocular Movements: Extraocular movements intact.  Cardiovascular:     Rate and Rhythm: Normal rate and regular rhythm.     Heart sounds: Normal heart sounds.  Pulmonary:     Effort: Pulmonary effort is normal. No respiratory distress.     Breath sounds: Normal breath sounds.  Abdominal:     General: A surgical scar is present. There is no distension.     Palpations: Abdomen is soft.     Tenderness: There is no abdominal tenderness. There is no guarding or rebound.     Comments: Abdomen is soft, he does not appearing overtly tender, non-distended, no rebound/guarding   Genitourinary:    Comments: Deferred Skin:    General: Skin is warm and dry.     Coloration: Skin is not jaundiced or pale.  Neurological:     General: No focal deficit present.     Mental Status: He is oriented to person, place, and time.  Psychiatric:        Mood and Affect: Mood normal.        Behavior: Behavior normal.      Labs:  CBC Latest Ref Rng & Units 10/06/2020 03/21/2020 03/10/2020  WBC 4.0 - 10.5 K/uL 28.3(H) 4.3 3.4(L)  Hemoglobin 13.0 - 17.0 g/dL 11.8(L) 10.3(L) 11.4(L)  Hematocrit 39.0 - 52.0 % 37.2(L)  32.6(L) 36.4(L)  Platelets 150 - 400 K/uL  273 159 171   CMP Latest Ref Rng & Units 10/06/2020 03/21/2020 03/10/2020  Glucose 70 - 99 mg/dL 208(H) 124(H) 130(H)  BUN 8 - 23 mg/dL 40(H) 52(H) 42(H)  Creatinine 0.61 - 1.24 mg/dL 1.14 1.60(H) 1.68(H)  Sodium 135 - 145 mmol/L 135 137 136  Potassium 3.5 - 5.1 mmol/L 4.3 5.0 5.2(H)  Chloride 98 - 111 mmol/L 94(L) 108 105  CO2 22 - 32 mmol/L 30 22 24   Calcium 8.9 - 10.3 mg/dL 9.4 10.0 9.8  Total Protein 6.5 - 8.1 g/dL 7.3 - -  Total Bilirubin 0.3 - 1.2 mg/dL 0.9 - -  Alkaline Phos 38 - 126 U/L 65 - -  AST 15 - 41 U/L 23 - -  ALT 0 - 44 U/L 13 - -     Imaging studies:   CT Abdomen/Pelvis (10/06/2020) personally reviewed showing markedly distended stomach with dependent pneumatosis, and distended small bowel of unclear etiology, and radiologist report reviewed:  IMPRESSION: 1. Distended stomach with diffuse pneumatosis tracking superiorly along the esophagus. The pneumatosis could be benign (from distension and vomiting) or an ischemic manifestation. 2. Distended proximal small bowel loops without fluid level, occult obstruction versus gastroenteritis. 3. Left lower lobe airspace disease, presumed aspiration.   Assessment/Plan: (ICD-10's: K72.609) 74 y.o. male with nausea/emesis from assisted living facility found to have gastric distension and dependent pneumatosis of unclear etiology, likely benign, as his work up and examination are reassuring aside from leukocytosis to 09X, complicated by pertinent comorbidities including dementia.   - Appreciate medicine admission  - As he has stopped having emesis, can hold off on NGT for now as this is difficult given his mentation. If he develops emesis, distension, pain, then recommend placing this  - No need for emergent intervention. We will monitor him closely  - Recommend serial KUBs. May need repeat CT in 72 hours to reassess pneumatosis   - NPO for now (okay with ice chips and sips for comfort only)  - IVF resuscitation    - Agree  with IV ABx (Unasyn)  - Monitor abdominal examination; on-going bowel function  - Further management per primary service; we will follow     All of the above findings and recommendations were discussed with the patient's wife at bedside, and all of patient's wife's questions were answered to her expressed satisfaction.  Thank you for the opportunity to participate in this patient's care.   -- Edison Simon, PA-C [facility] 10/06/2020, 7:28 AM [phone] M-F: 7am - 4pm

## 2020-10-06 NOTE — ED Notes (Addendum)
Attempted NG insertion at this time. Pt unable to tolerate at this time. Informed surgical PA Zach, who is at bedside.

## 2020-10-06 NOTE — ED Notes (Signed)
Lab at bedside at this time.  

## 2020-10-06 NOTE — Progress Notes (Signed)
Prayer and emotional support to patient and his spouse.

## 2020-10-06 NOTE — ED Notes (Signed)
Pharmacy called at this time to confirm compatibility of unasyn and LR. Per Amy in pharmacy, too much variability to confirm compatibility. Per recommendation, will run unasyn then restart LR after.

## 2020-10-06 NOTE — ED Notes (Signed)
Pt had saturated brief. Pt cleaned up and in and out cath performed. Pt placed in clean brief and gown. Pt given blanket and pulled up in bed.

## 2020-10-06 NOTE — ED Notes (Addendum)
Morning labs not collected by night RN. Lab called and will come and collect morning labs.

## 2020-10-06 NOTE — ED Notes (Addendum)
Pt placed in gown and clean brief with EDT Emma. Pt pulled up in bed and given blanket.

## 2020-10-06 NOTE — ED Provider Notes (Signed)
[facility] Emergency Department Provider Note  ____________________________________________  Time seen: Approximately 4:40 AM  I have reviewed the triage vital signs and the nursing notes.   HISTORY  Chief Complaint Abdominal Pain  Level 5 caveat:  Portions of the history and physical were unable to be obtained due to dementia, non verbal   HPI James Hicks is a 74 y.o. male with a history of CVA, bedbound, nonverbal, diabetes, hypertension, hyperlipidemia who presents for evaluation  of nausea, vomiting, and abdominal pain.  Patient is nonverbal and history is gathered from paperwork that came from nursing home.  Patient seems to be having abdominal pain and he was vomiting.  Facility did blood work which show leukocytosis with white count of 18 and a KUB with questionable of an obstruction.  Past Medical History:  Diagnosis Date  . Cholelithiasis   . Chronic renal failure    kidney transplant   . Colon polyps   . CVA (cerebral infarction)   . Degenerative joint disease   . Diabetes mellitus without complication ([place])   . Diabetic neuropathy ([place])   . Hydronephrosis of right kidney   . Hyperlipidemia   . Hypertension   . Renal cell carcinoma ([place])   . Stroke [facility])      Past Surgical History:  Procedure Laterality Date  . CATARACT EXTRACTION    . CHOLECYSTECTOMY    . COLONOSCOPY    . COLONOSCOPY WITH PROPOFOL N/A 05/28/2018   Procedure: COLONOSCOPY WITH PROPOFOL;  Surgeon: Toledo, Benay Pike, MD;  Location: [facility] ENDOSCOPY;  Service: Gastroenterology;  Laterality: N/A;  . KIDNEY TRANSPLANT    . KNEE ARTHROSCOPY    . NEPHRECTOMY    . TONSILLECTOMY    . WISDOM TOOTH EXTRACTION      Prior to Admission medications   Medication Sig Start Date End Date Taking? Authorizing Provider  allopurinol (ZYLOPRIM) 100 MG tablet Take 100 mg by mouth daily.    [provider]  aspirin EC 81 MG tablet Take 81 mg by mouth daily.    [provider]  atorvastatin (LIPITOR) 10 MG tablet Take 10 mg by mouth daily.    [provider]  calcium-vitamin D (OSCAL WITH D) 500-200 MG-UNIT tablet Take 1 tablet by mouth 2 (two) times daily.    [provider]  clopidogrel (PLAVIX) 75 MG tablet Take 75 mg by mouth daily.    [provider]  colchicine 0.6 MG tablet Take 0.6 mg by mouth daily.    [provider]  enalapril (VASOTEC) 20 MG tablet Take 20 mg by mouth daily.    [provider]  furosemide (LASIX) 20 MG tablet Take 20 mg by mouth 2 (two) times daily.    [provider]  hydrochlorothiazide (HYDRODIURIL) 50 MG tablet Take 50 mg by mouth daily.    [provider]  HYDROcodone-acetaminophen (NORCO/VICODIN) 5-325 MG tablet Take 1 tablet by mouth every 4 (four) hours as needed for moderate pain. 07/05/19   Cuthriell, Charline Bills, PA-C  insulin glargine (LANTUS) 100 UNIT/ML injection Inject 15 Units into the skin at bedtime.     [provider]  insulin lispro (HUMALOG) 100 UNIT/ML injection Inject 10 Units into the skin 3 (three) times daily with meals. Per sliding scale    [provider]  metoprolol succinate (TOPROL-XL) 25 MG 24 hr tablet Take 25 mg by mouth daily. 07/31/18   [provider]  mycophenolate (CELLCEPT) 500 MG tablet Take 1,000 mg by  mouth 2 (two) times daily.     [provider]  NIFEdipine (PROCARDIA XL/ADALAT-CC) 60 MG 24 hr tablet Take 60 mg by mouth 2 (two) times daily.    [provider]  predniSONE (DELTASONE) 10 MG tablet Take 1 tablet (10 mg total) by mouth daily. 07/05/19   Cuthriell, Charline Bills, PA-C  tacrolimus (PROGRAF) 1 MG capsule Take 5 mg by mouth 2 (two) times daily.    [provider]  timolol (TIMOPTIC) 0.25 % ophthalmic solution Place 1 drop into both eyes 2 (two) times daily.     [provider]  traMADol (ULTRAM) 50 MG tablet Take 1 tablet (50 mg total) by mouth every 6 (six)  hours as needed. 08/06/19   Fisher, Linden Dolin, PA-C    Allergies Hylan g-f 20, Latex, and Other  Family History  Problem Relation Age of Onset  . Colon cancer Mother   . Diabetes Mother   . Breast cancer Sister     Social History Social History   Tobacco Use  . Smoking status: Never Smoker  . Smokeless tobacco: Never Used  Vaping Use  . Vaping Use: Never used  Substance Use Topics  . Alcohol use: Not Currently  . Drug use: Not Currently    Review of Systems  Constitutional: Negative for fever. Cardiovascular: Negative for chest pain. Respiratory: Negative for shortness of breath. Gastrointestinal: + abdominal pain, nausea, and vomiting. No diarrhea.  Level 5 caveat:  Portions of the history and physical were unable to be obtained due to nonverbal  ____________________________________________   PHYSICAL EXAM:  VITAL SIGNS: ED Triage Vitals  Enc Vitals Group     BP 10/06/20 0241 (!) 148/80     Pulse Rate 10/06/20 0241 99     Resp 10/06/20 0241 18     Temp 10/06/20 0241 98.8 F (37.1 C)     Temp Source 10/06/20 0241 Oral     SpO2 10/06/20 0241 99 %     Weight 10/06/20 0243 160 lb (72.6 kg)     Height 10/06/20 0243 6\' 3"  (1.905 m)     Head Circumference --      Peak Flow --      Pain Score --      Pain Loc --      Pain Edu? --      Excl. in [place]? --     Constitutional: Alert, no distress.  HEENT:      Head: Normocephalic and atraumatic.         Eyes: Conjunctivae are normal. Sclera is non-icteric.       Mouth/Throat: Mucous membranes are moist.       Neck: Supple with no signs of meningismus. Cardiovascular: Regular rate and rhythm. No murmurs, gallops, or rubs. Respiratory: Normal respiratory effort. Lungs are clear to auscultation bilaterally.  Gastrointestinal: Soft, nondistended, diffusely tender, no rebound or guarding  Musculoskeletal: No edema, cyanosis, or erythema of extremities. Neurologic: Face is symmetric. Moving all extremities. No gross  focal neurologic deficits are appreciated. Skin: Skin is warm, dry and intact. No rash noted. Psychiatric: Mood and affect are normal. Speech and behavior are normal.  ____________________________________________   LABS (all labs ordered are listed, but only abnormal results are displayed)  Labs Reviewed  COMPREHENSIVE METABOLIC PANEL - Abnormal; Notable for the following components:      Result Value   Chloride 94 (*)    Glucose, Bld 208 (*)    BUN 40 (*)    Albumin 3.3 (*)  All other components within normal limits  CBC WITH DIFFERENTIAL/PLATELET - Abnormal; Notable for the following components:   WBC 28.3 (*)    Hemoglobin 11.8 (*)    HCT 37.2 (*)    MCH 25.9 (*)    RDW 15.6 (*)    Neutro Abs 24.7 (*)    Monocytes Absolute 1.5 (*)    Abs Immature Granulocytes 0.18 (*)    All other components within normal limits  TROPONIN I (HIGH SENSITIVITY) - Abnormal; Notable for the following components:   Troponin I (High Sensitivity) 23 (*)    All other components within normal limits  CULTURE, BLOOD (ROUTINE X 2)  CULTURE, BLOOD (ROUTINE X 2)  LIPASE, BLOOD  CBC WITH DIFFERENTIAL/PLATELET  URINALYSIS, COMPLETE (UACMP) WITH MICROSCOPIC  LACTIC ACID, PLASMA  LACTIC ACID, PLASMA  TROPONIN I (HIGH SENSITIVITY)   ____________________________________________  EKG  ED ECG REPORT I, Rudene Re, the attending physician, personally viewed and interpreted this ECG.  Sinus tachycardia, rate of 100, LVH, no ST elevations ____________________________________________  RADIOLOGY  I have personally reviewed the images performed during this visit and I agree with the Radiologist's read.   Interpretation by Radiologist:  CT ABDOMEN PELVIS WO CONTRAST  Result Date: 10/06/2020 CLINICAL DATA:  Unspecified abdominal pain.  Vomiting. EXAM: CT ABDOMEN AND PELVIS WITHOUT CONTRAST TECHNIQUE: Multidetector CT imaging of the abdomen and pelvis was performed following the standard  protocol without IV contrast. COMPARISON:  None similar and available FINDINGS: Lower chest: Airspace disease in the left lower lobe with superimposed atelectasis and volume loss. Extensive coronary atherosclerosis. Hepatobiliary: No focal liver abnormality.Cholecystectomy. Pancreas: Generalized atrophy Spleen: Unremarkable. Adrenals/Urinary Tract: Negative adrenals. Bilateral nephrectomy. There is history of renal cell carcinoma. Normal size transplant kidney in the left pelvis. Unremarkable bladder. Stomach/Bowel: Very distended stomach with fluid levels and generalized pneumatosis that tracks superiorly along the lower esophagus. Multiple fluid and gas-filled small bowel loops without definite transition point, limited due to streak artifact and lack of oral contrast. Vascular/Lymphatic: Extensive atheromatous calcification. No mass or adenopathy. Reproductive:No pathologic findings. Other: No ascites or pneumoperitoneum. Musculoskeletal: No acute abnormalities. Advanced generalized spondylosis and facet spurring. IMPRESSION: 1. Distended stomach with diffuse pneumatosis tracking superiorly along the esophagus. The pneumatosis could be benign (from distension and vomiting) or an ischemic manifestation. 2. Distended proximal small bowel loops without fluid level, occult obstruction versus gastroenteritis. 3. Left lower lobe airspace disease, presumed aspiration. Electronically Signed   By: Monte Fantasia M.D.   On: 10/06/2020 05:20     ____________________________________________   PROCEDURES  Procedure(s) performed:yes .1-3 Lead EKG Interpretation Performed by: Rudene Re, MD Authorized by: Rudene Re, MD     Interpretation: abnormal     ECG rate assessment: tachycardic     Rhythm: sinus tachycardia     Critical Care performed: yes  CRITICAL CARE Performed by: Rudene Re  ?  Total critical care time: 35 min  Critical care time was exclusive of separately billable  procedures and treating other patients.  Critical care was necessary to treat or prevent imminent or life-threatening deterioration.  Critical care was time spent personally by me on the following activities: development of treatment plan with patient and/or surrogate as well as nursing, discussions with consultants, evaluation of patient's response to treatment, examination of patient, obtaining history from patient or surrogate, ordering and performing treatments and interventions, ordering and review of laboratory studies, ordering and review of radiographic studies, pulse oximetry and re-evaluation of patient's condition.  ____________________________________________   INITIAL IMPRESSION /  ASSESSMENT AND PLAN / ED COURSE  74 y.o. male with a history of CVA, bedbound, nonverbal, diabetes, hypertension, hyperlipidemia who presents for evaluation  of nausea, vomiting, and abdominal pain.  Patient is nonverbal and unable to provide any history.  He is afebrile with normal vital signs, abdomen is diffusely tender to palpation with no rebound or guarding.  Blood work consistent with leukocytosis of white count of 28 with a left shift, stable chemistry panel and lipase.  CT showing severely distended stomach with diffuse pneumatosis and distended proximal small bowel loops.  CT visualized by me.  Discussed with radiologist who believes findings can be consistent with ischemic bowel versus gastroenteritis with ileus or possible occult small bowel obstruction.  Discussed with Dr. Christian Mate from surgery who recommended NG tube placement and admission to medicine for monitoring of patient's abdominal status.  We will get a lactic to rule out ischemic bowel.  CT is also consistent with aspiration pneumonia.  No hypoxia.  Will cover with Unasyn.  Patient's wife is at bedside and plan was discussed with her who is in agreement.  Old medical records reviewed including most recent colonoscopy done in 2019 which was  unremarkable.  Patient on telemetry for close monitoring.      _____________________________________________ Please note:  Patient was evaluated in Emergency Department today for the symptoms described in the history of present illness. Patient was evaluated in the context of the global COVID-19 pandemic, which necessitated consideration that the patient might be at risk for infection with the SARS-CoV-2 virus that causes COVID-19. Institutional protocols and algorithms that pertain to the evaluation of patients at risk for COVID-19 are in a state of rapid change based on information released by regulatory bodies including the CDC and federal and state organizations. These policies and algorithms were followed during the patient's care in the ED.  Some ED evaluations and interventions may be delayed as a result of limited staffing during the pandemic.   [place] Controlled Substance Database was reviewed by me. ____________________________________________   FINAL CLINICAL IMPRESSION(S) / ED DIAGNOSES   Final diagnoses:  SBO (small bowel obstruction) (HCC)  Aspiration pneumonia of left lung due to gastric secretions, unspecified part of lung ([place])      NEW MEDICATIONS STARTED DURING THIS VISIT:  ED Discharge Orders    None       Note:  This document was prepared using Dragon voice recognition software and may include unintentional dictation errors.    Alfred Levins, [place], MD 10/06/20 [phone]

## 2020-10-07 ENCOUNTER — Inpatient Hospital Stay: Payer: Medicare Other

## 2020-10-07 DIAGNOSIS — E119 Type 2 diabetes mellitus without complications: Secondary | ICD-10-CM

## 2020-10-07 DIAGNOSIS — Z794 Long term (current) use of insulin: Secondary | ICD-10-CM

## 2020-10-07 DIAGNOSIS — F0150 Vascular dementia without behavioral disturbance: Secondary | ICD-10-CM

## 2020-10-07 DIAGNOSIS — E872 Acidosis, unspecified: Secondary | ICD-10-CM

## 2020-10-07 DIAGNOSIS — J690 Pneumonitis due to inhalation of food and vomit: Secondary | ICD-10-CM

## 2020-10-07 DIAGNOSIS — I10 Essential (primary) hypertension: Secondary | ICD-10-CM

## 2020-10-07 DIAGNOSIS — Z940 Kidney transplant status: Secondary | ICD-10-CM

## 2020-10-07 DIAGNOSIS — K6389 Other specified diseases of intestine: Secondary | ICD-10-CM

## 2020-10-07 LAB — GLUCOSE, CAPILLARY
Glucose-Capillary: 166 mg/dL — ABNORMAL HIGH (ref 70–99)
Glucose-Capillary: 72 mg/dL (ref 70–99)
Glucose-Capillary: 86 mg/dL (ref 70–99)
Glucose-Capillary: 144 mg/dL — ABNORMAL HIGH (ref 70–99)
Glucose-Capillary: 151 mg/dL — ABNORMAL HIGH (ref 70–99)
Glucose-Capillary: 136 mg/dL — ABNORMAL HIGH (ref 70–99)

## 2020-10-07 LAB — BASIC METABOLIC PANEL
Anion gap: 9 (ref 5–15)
BUN: 37 mg/dL — ABNORMAL HIGH (ref 8–23)
CO2: 30 mmol/L (ref 22–32)
Calcium: 9.7 mg/dL (ref 8.9–10.3)
Chloride: 100 mmol/L (ref 98–111)
Creatinine, Ser: 0.96 mg/dL (ref 0.61–1.24)
GFR, Estimated: >60 mL/min (ref >60)
Glucose, Bld: 103 mg/dL — ABNORMAL HIGH (ref 70–99)
Potassium: 3.9 mmol/L (ref 3.5–5.1)
Sodium: 139 mmol/L (ref 135–145)

## 2020-10-07 LAB — CBC
HCT: 34 % — ABNORMAL LOW (ref 39.0–52.0)
Hemoglobin: 10.9 g/dL — ABNORMAL LOW (ref 13.0–17.0)
MCH: 26.7 pg (ref 26.0–34.0)
MCHC: 32.1 g/dL (ref 30.0–36.0)
MCV: 83.3 fL (ref 80.0–100.0)
Platelets: 256 10*3/uL (ref 150–400)
RBC: 4.08 MIL/uL — ABNORMAL LOW (ref 4.22–5.81)
RDW: 15.9 % — ABNORMAL HIGH (ref 11.5–15.5)
WBC: 18.8 10*3/uL — ABNORMAL HIGH (ref 4.0–10.5)
nRBC: 0 % (ref 0.0–0.2)

## 2020-10-07 LAB — LACTIC ACID, PLASMA: Lactic Acid, Venous: 0.9 mmol/L (ref 0.5–1.9)

## 2020-10-07 MED ORDER — PREDNISONE 20 MG PO TABS
10.0000 mg | ORAL_TABLET | Freq: Every day | ORAL | Status: DC
  Administered 2020-10-07 – 2020-10-11 (×5): 10 mg via ORAL
  Filled 2020-10-07 (×5): qty 1

## 2020-10-07 MED ORDER — METOPROLOL SUCCINATE ER 25 MG PO TB24
25.0000 mg | ORAL_TABLET | Freq: Every day | ORAL | Status: DC
  Administered 2020-10-07 – 2020-10-11 (×5): 25 mg via ORAL
  Filled 2020-10-07 (×5): qty 1

## 2020-10-07 MED ORDER — MYCOPHENOLATE MOFETIL 250 MG PO CAPS
500.0000 mg | ORAL_CAPSULE | Freq: Two times a day (BID) | ORAL | Status: DC
  Administered 2020-10-07 – 2020-10-11 (×9): 500 mg via ORAL
  Filled 2020-10-07 (×11): qty 2

## 2020-10-07 MED ORDER — TRAZODONE HCL 100 MG PO TABS
100.0000 mg | ORAL_TABLET | Freq: Every day | ORAL | Status: DC
  Administered 2020-10-07 – 2020-10-10 (×4): 100 mg via ORAL
  Filled 2020-10-07 (×2): qty 1
  Filled 2020-10-07: qty 2
  Filled 2020-10-07: qty 1

## 2020-10-07 MED ORDER — QUETIAPINE FUMARATE 100 MG PO TABS
100.0000 mg | ORAL_TABLET | Freq: Every day | ORAL | Status: DC
  Administered 2020-10-07 – 2020-10-10 (×4): 100 mg via ORAL
  Filled 2020-10-07 (×2): qty 1
  Filled 2020-10-07: qty 4
  Filled 2020-10-07: qty 1

## 2020-10-07 MED ORDER — TACROLIMUS 1 MG PO CAPS
5.0000 mg | ORAL_CAPSULE | Freq: Every day | ORAL | Status: DC
  Administered 2020-10-07 – 2020-10-11 (×5): 5 mg via ORAL
  Filled 2020-10-07 (×5): qty 5

## 2020-10-07 MED ORDER — TACROLIMUS 1 MG PO CAPS
6.0000 mg | ORAL_CAPSULE | Freq: Every day | ORAL | Status: DC
  Administered 2020-10-07 – 2020-10-10 (×4): 6 mg via ORAL
  Filled 2020-10-07 (×6): qty 6

## 2020-10-07 NOTE — Plan of Care (Signed)

## 2020-10-07 NOTE — Progress Notes (Signed)
Patient ID: James Hicks, male   DOB: 1946-06-07, 74 y.o.   MRN: [id number] [facility] PROGRESS NOTE  AINSLEY SANGUINETTI TKW:[id number] DOB: [birth year] DOA: 10/06/2020 PCP: Derinda Late, MD  HPI/Subjective: Patient answers a few questions.  Because of his vascular dementia difficult to know if the answers are true.  No nausea or vomiting.  No abdominal pain.  Patient was admitted with abdominal pain and found to have small bowel obstruction.  Objective: Vitals:   10/07/20 0854 10/07/20 1151  BP: (!) 164/92 (!) 163/67  Pulse: 81 82  Resp:  18  Temp:  98.2 F (36.8 C)  SpO2:  100%   No intake or output data in the 24 hours ending 10/07/20 1328 Filed Weights   10/06/20 0243 10/06/20 2131  Weight: 72.6 kg 68.4 kg    ROS: Review of Systems  Respiratory: Negative for shortness of breath.   Cardiovascular: Negative for chest pain.  Gastrointestinal: Negative for abdominal pain, nausea and vomiting.   Exam: Physical Exam HENT:     Head: Normocephalic.     Mouth/Throat:     Pharynx: No oropharyngeal exudate.  Eyes:     General: Lids are normal.     Conjunctiva/sclera: Conjunctivae normal.     Pupils: Pupils are equal, round, and reactive to light.  Cardiovascular:     Rate and Rhythm: Normal rate and regular rhythm.     Heart sounds: Normal heart sounds, S1 normal and S2 normal.  Pulmonary:     Breath sounds: No decreased breath sounds, wheezing, rhonchi or rales.  Abdominal:     Palpations: Abdomen is soft.     Tenderness: There is no abdominal tenderness.  Musculoskeletal:     Right ankle: No swelling.     Left ankle: No swelling.  Skin:    General: Skin is warm.     Findings: No rash.  Neurological:     Mental Status: He is alert.     Comments: Answers some questions.       Data Reviewed: Basic Metabolic Panel: Recent Labs  Lab 10/06/20 0250 10/07/20 0906  NA 135 139  K 4.3 3.9  CL 94* 100  CO2 30 30  GLUCOSE 208* 103*  BUN 40* 37*  CREATININE 1.14  0.96  CALCIUM 9.4 9.7   Liver Function Tests: Recent Labs  Lab 10/06/20 0250  AST 23  ALT 13  ALKPHOS 65  BILITOT 0.9  PROT 7.3  ALBUMIN 3.3*   Recent Labs  Lab 10/06/20 0250  LIPASE 25   CBC: Recent Labs  Lab 10/06/20 0316 10/07/20 0906  WBC 28.3* 18.8*  NEUTROABS 24.7*  --   HGB 11.8* 10.9*  HCT 37.2* 34.0*  MCV 81.8 83.3  PLT 273 256    CBG: Recent Labs  Lab 10/06/20 2151 10/07/20 0135 10/07/20 0654 10/07/20 0820 10/07/20 1150  GLUCAP 222* 166* 72 86 144*    Recent Results (from the past 240 hour(s))  Blood culture (routine x 2)     Status: None (Preliminary result)   Collection Time: 10/06/20  7:55 AM   Specimen: BLOOD  Result Value Ref Range Status   Specimen Description BLOOD BLOOD RIGHT HAND  Final   Special Requests   Final    BOTTLES DRAWN AEROBIC AND ANAEROBIC Blood Culture adequate volume   Culture   Final    NO GROWTH < 24 HOURS Performed at [facility], [address] [place], [place] [address]    Report Status PENDING  Incomplete  Blood culture (routine x 2)     Status: None (Preliminary result)   Collection Time: 10/06/20  8:02 AM   Specimen: BLOOD  Result Value Ref Range Status   Specimen Description BLOOD BLOOD RIGHT ARM  Final   Special Requests   Final    BOTTLES DRAWN AEROBIC AND ANAEROBIC Blood Culture adequate volume   Culture   Final    NO GROWTH < 24 HOURS Performed at [facility], [address], [place], [place] [address]    Report Status PENDING  Incomplete  Resp Panel by RT-PCR (Flu A&B, Covid) Nasopharyngeal Swab     Status: None   Collection Time: 10/06/20  4:27 PM   Specimen: Nasopharyngeal Swab; Nasopharyngeal(NP) swabs in vial transport medium  Result Value Ref Range Status   SARS Coronavirus 2 by RT PCR NEGATIVE NEGATIVE Final    Comment: (NOTE) SARS-CoV-2 target nucleic acids are NOT DETECTED.  The SARS-CoV-2 RNA is generally detectable in upper respiratory specimens during the acute  phase of infection. The lowest concentration of SARS-CoV-2 viral copies this assay can detect is 138 copies/mL. A negative result does not preclude SARS-Cov-2 infection and should not be used as the sole basis for treatment or other patient management decisions. A negative result may occur with  improper specimen collection/handling, submission of specimen other than nasopharyngeal swab, presence of viral mutation(s) within the areas targeted by this assay, and inadequate number of viral copies(<138 copies/mL). A negative result must be combined with clinical observations, patient history, and epidemiological information. The expected result is Negative.  Fact Sheet for Patients:  EntrepreneurPulse.com.au  Fact Sheet for Healthcare Providers:  IncredibleEmployment.be  This test is no t yet approved or cleared by the [place] FDA and  has been authorized for detection and/or diagnosis of SARS-CoV-2 by FDA under an Emergency Use Authorization (EUA). This EUA will remain  in effect (meaning this test can be used) for the duration of the COVID-19 declaration under Section 564(b)(1) of the Act, 21 U.S.C.section 360bbb-3(b)(1), unless the authorization is terminated  or revoked sooner.       Influenza A by PCR NEGATIVE NEGATIVE Final   Influenza B by PCR NEGATIVE NEGATIVE Final    Comment: (NOTE) The Xpert Xpress SARS-CoV-2/FLU/RSV plus assay is intended as an aid in the diagnosis of influenza from Nasopharyngeal swab specimens and should not be used as a sole basis for treatment. Nasal washings and aspirates are unacceptable for Xpert Xpress SARS-CoV-2/FLU/RSV testing.  Fact Sheet for Patients: EntrepreneurPulse.com.au  Fact Sheet for Healthcare Providers: IncredibleEmployment.be  This test is not yet approved or cleared by the [place] FDA and has been authorized for detection and/or diagnosis of  SARS-CoV-2 by FDA under an Emergency Use Authorization (EUA). This EUA will remain in effect (meaning this test can be used) for the duration of the COVID-19 declaration under Section 564(b)(1) of the Act, 21 U.S.C. section 360bbb-3(b)(1), unless the authorization is terminated or revoked.  Performed at [facility], [address] [place], [place] [address]      Studies: CT ABDOMEN PELVIS WO CONTRAST  Result Date: 10/06/2020 CLINICAL DATA:  Unspecified abdominal pain.  Vomiting. EXAM: CT ABDOMEN AND PELVIS WITHOUT CONTRAST TECHNIQUE: Multidetector CT imaging of the abdomen and pelvis was performed following the standard protocol without IV contrast. COMPARISON:  None similar and available FINDINGS: Lower chest: Airspace disease in the left lower lobe with superimposed atelectasis and volume loss. Extensive coronary atherosclerosis. Hepatobiliary: No focal liver abnormality.Cholecystectomy. Pancreas: Generalized atrophy  Spleen: Unremarkable. Adrenals/Urinary Tract: Negative adrenals. Bilateral nephrectomy. There is history of renal cell carcinoma. Normal size transplant kidney in the left pelvis. Unremarkable bladder. Stomach/Bowel: Very distended stomach with fluid levels and generalized pneumatosis that tracks superiorly along the lower esophagus. Multiple fluid and gas-filled small bowel loops without definite transition point, limited due to streak artifact and lack of oral contrast. Vascular/Lymphatic: Extensive atheromatous calcification. No mass or adenopathy. Reproductive:No pathologic findings. Other: No ascites or pneumoperitoneum. Musculoskeletal: No acute abnormalities. Advanced generalized spondylosis and facet spurring. IMPRESSION: 1. Distended stomach with diffuse pneumatosis tracking superiorly along the esophagus. The pneumatosis could be benign (from distension and vomiting) or an ischemic manifestation. 2. Distended proximal small bowel loops without fluid level, occult  obstruction versus gastroenteritis. 3. Left lower lobe airspace disease, presumed aspiration. Electronically Signed   By: Monte Fantasia M.D.   On: 10/06/2020 05:20   DG Abd 2 Views  Result Date: 10/07/2020 CLINICAL DATA:  Small-bowel obstruction. EXAM: ABDOMEN - 2 VIEW COMPARISON:  CT 10/06/2020. FINDINGS: Surgical clips noted over the abdomen. Gastric distention and gastric pneumatosis again noted. Previously identified small bowel distention noted on CT of 10/06/2020 not identified on this standard abdomen series. Follow-up CT can be obtained as needed. No free air identified. Degenerative change lumbar spine and both hips. IMPRESSION: Gastric distention and gastric pneumatosis again noted. Previously identified small bowel distention noted on CT of 10/06/2020 not identified on this standard abdomen series. Follow-up CT can be obtained as needed. Electronically Signed   By: [place]   On: 10/07/2020 07:03    Scheduled Meds: . enoxaparin (LOVENOX) injection  40 mg Subcutaneous Q24H  . insulin aspart  0-15 Units Subcutaneous Q4H  . methylPREDNISolone (SOLU-MEDROL) injection  20 mg Intravenous Daily  . metoprolol succinate  25 mg Oral Daily  . mycophenolate  500 mg Oral BID  . pantoprazole (PROTONIX) IV  40 mg Intravenous Q24H  . predniSONE  10 mg Oral Daily  . QUEtiapine  100 mg Oral QHS  . tacrolimus  5 mg Oral Daily  . tacrolimus  6 mg Oral QHS  . traZODone  100 mg Oral QHS   Continuous Infusions: . sodium chloride Stopped (10/06/20 1011)  . ampicillin-sulbactam (UNASYN) IV 3 g (10/07/20 0910)    Assessment/Plan:  1. Small bowel obstruction with distended stomach with pneumatosis.  Patient also with leukocytosis.  Empiric Unasyn.  Surgical team following and conservative management.  Currently n.p.o. the patient has had numerous abdominal surgeries in the past. 2. History of kidney transplant needs immunosuppressive medications including prednisone tacrolimus and CellCept  will order. 3. Essential hypertension restart low-dose Toprol. 4. Aspiration pneumonia and leukocytosis on empiric Unasyn 5. Type 2 diabetes mellitus on sliding scale insulin.  Hemoglobin A1c 7.4. 6. Lactic acidosis on IV fluids 7. Vascular dementia.  Continue Seroquel at night. 8. Weakness.  Physical therapy evaluation. 9. History of subdural hematoma     Code Status:     Code Status Orders  (From admission, onward)         Start     Ordered   10/06/20 0929  Full code  Continuous        10/06/20 0930        Code Status History    This patient has a current code status but no historical code status.   Advance Care Planning Activity     Family Communication: Wife at the bedside Disposition Plan: Status is: Inpatient  Dispo: The patient is from: Home  Anticipated d/c is to: Home              Anticipated d/c date is: Likely will need 3 or 4 days here in the hospital to allow small bowel obstruction to resolve.              Patient currently being treated conservatively for small bowel obstruction.  Consultants:  General surgery  Antibiotics:  Unasyn  Time spent: 28 minutes  [place]

## 2020-10-07 NOTE — Consult Note (Signed)
Pharmacy Antibiotic Note  James Hicks is a 74 y.o. male admitted on 10/06/2020 with aspiration pneumonia.  Pharmacy has been consulted for unasyn dosing. CT abdomen c/f LLL aspiration PNA and known recent emesis with ongoing further w/u for bowel obstruction or other source abd'l pain. Current leukocytosis WBC 28.3, afeb, VSS. 12/9 BCx sent/pending.  Plan:  Unasyn 3g IV q6h for aspiration event. Monitor CBC, cultures, and for clinical signs of infectious course.  Height: 6' (182.9 cm) Weight: 68.4 kg (150 lb 12.7 oz) IBW/kg (Calculated) : 77.6  Temp (24hrs), Avg:98.3 F (36.8 C), Min:97.9 F (36.6 C), Max:98.6 F (37 C)  Recent Labs  Lab 10/06/20 0250 10/06/20 0316 10/06/20 0754 10/06/20 1806 10/07/20 0906  WBC  --  28.3*  --   --  18.8*  CREATININE 1.14  --   --   --  0.96  LATICACIDVEN  --   --  1.5 2.5* 0.9    Estimated Creatinine Clearance: 64.3 mL/min (by C-G formula based on SCr of 0.96 mg/dL).    Allergies  Allergen Reactions  . Hylan G-F 20 Anaphylaxis    Other reaction(s): Other (See Comments), Unknown Other reaction(s): Unknown Other reaction(s): Unknown Other reaction(s): Unknown   . Latex Hives  . Other Anaphylaxis and Other (See Comments)    IBAND IBAND IBAND IBAND     Antimicrobials this admission: unasyn 3g q6h 12/9>>  Dose adjustments this admission: N/A  Microbiology results: 12/9 BCx: sent   Thank you for allowing pharmacy to be a part of this patient's care.  James Hicks A 10/07/2020 10:59 AM

## 2020-10-07 NOTE — Progress Notes (Addendum)
[facility] SURGICAL PROGRESS NOTE (cpt [phone])  Hospital Day(s): 1.   Interval History:  History limited secondary to dementia  Patient seen and examined no acute events or new complaints overnight.  Patient remains somnolent and does not participate in history or examination Labs are pending this morning Lactic acid level at 1800 had increased to 2.5; repeat being drawn now (0900) Repeat KUB again with marked gastric distension and pneumatosis Unsure bowel function  Review of Systems:  Unable to reliably preform   Vital signs in last 24 hours: [min-max] current  Temp:  [97.9 F (36.6 C)-98.6 F (37 C)] 98.2 F (36.8 C) (12/10 1151) Pulse Rate:  [68-100] 82 (12/10 1151) Resp:  [16-20] 18 (12/10 1151) BP: (136-177)/(65-139) 163/67 (12/10 1151) SpO2:  [87 %-100 %] 100 % (12/10 1151) Weight:  [150 lb 12.7 oz (68.4 kg)] 150 lb 12.7 oz (68.4 kg) (12/09 2131)     Height: 6' (182.9 cm) Weight: 150 lb 12.7 oz (68.4 kg) BMI (Calculated): 20.45   Intake/Output last 2 shifts:  12/09 0701 - 12/10 0700 In: -  Out: 200 [Urine:200]   Physical Exam:  Constitutional: somnolent, does not arouse to verbal or painful stimuli HENT: normocephalic without obvious abnormality  Respiratory: breathing non-labored at rest  Cardiovascular: regular rate and sinus rhythm  Gastrointestinal: Soft, he does not appear to have any pain on examination but again this is limited secondary to mental status, non-distended, no rebound/guarding, I do not appreciate any sign of peritonitis  Musculoskeletal: muscle wasting, no edema    Labs:  CBC Latest Ref Rng & Units 10/07/2020 10/06/2020 03/21/2020  WBC 4.0 - 10.5 K/uL 18.8(H) 28.3(H) 4.3  Hemoglobin 13.0 - 17.0 g/dL 10.9(L) 11.8(L) 10.3(L)  Hematocrit 39.0 - 52.0 % 34.0(L) 37.2(L) 32.6(L)  Platelets 150 - 400 K/uL 256 273 159   CMP Latest Ref Rng & Units 10/07/2020 10/06/2020 03/21/2020  Glucose 70 - 99 mg/dL 103(H) 208(H) 124(H)  BUN 8 -  23 mg/dL 37(H) 40(H) 52(H)  Creatinine 0.61 - 1.24 mg/dL 0.96 1.14 1.60(H)  Sodium 135 - 145 mmol/L 139 135 137  Potassium 3.5 - 5.1 mmol/L 3.9 4.3 5.0  Chloride 98 - 111 mmol/L 100 94(L) 108  CO2 22 - 32 mmol/L 30 30 22   Calcium 8.9 - 10.3 mg/dL 9.7 9.4 10.0  Total Protein 6.5 - 8.1 g/dL - 7.3 -  Total Bilirubin 0.3 - 1.2 mg/dL - 0.9 -  Alkaline Phos 38 - 126 U/L - 65 -  AST 15 - 41 U/L - 23 -  ALT 0 - 44 U/L - 13 -     Imaging studies:   DG 2-View Abdominal XR (10/07/2020) personally reviewed with marked gastric distension and pneumatosis again seen, less prominent small bowel dilation, and radiologist report reviewed:  IMPRESSION: Gastric distention and gastric pneumatosis again noted. Previously identified small bowel distention noted on CT of 10/06/2020 not identified on this standard abdomen series. Follow-up CT can be obtained as needed.   Assessment/Plan: (ICD-10's: K22.609) 74 y.o. male found to have gastric distension and dependent pneumatosis of unclear etiology, likely benign, as his work up and examination are reassuring, complicated by pertinent comorbidities including dementia.   - As he has stopped having emesis, can hold off on NGT for now as this is difficult given his mentation. If he develops emesis, distension, pain, then recommend placing this             - No need for emergent intervention. We will monitor him  closely             - Recommend serial KUBs. May need repeat CT in 48-72 hours to reassess pneumatosis    - Follow up repeat lactic acid level  - NPO for now (okay with ice chips and sips for comfort only)             - IVF resuscitation               - Agree with IV ABx (Unasyn)             - Monitor abdominal examination; on-going bowel function             - Further management per primary service; we will follow      All of the above findings and recommendations were discussed with the medical team. No family at bedside.   Shon Millet,  PA-C [facility] 10/07/2020, 1:02 PM [phone] M-F: 7am - 4pm  Clinically stable, minimal evidence of improvement.  No clinical indications at present.  Would consider GI consultation and upper endoscopy if concerns persist.  Clinically would continue n.p.o. until evidence of bowel function arises.  Still feel he would benefit from an NG tube however the pros and cons are obvious and currently feel best as is.  I have evaluated this patient and in conjunction with Mr. Olean Ree have established the plan noted.

## 2020-10-07 NOTE — Progress Notes (Signed)
Unable to draw labs this morning per phlebotomist. Will keep trying

## 2020-10-07 NOTE — NC FL2 (Signed)
[place] LEVEL OF CARE SCREENING TOOL     IDENTIFICATION  Patient Name: James [facility] Birthdate: 02-08-1946 Sex: male Admission Date (Current Location): 10/06/2020  [place] and [place] Number:  Engineering geologist and Address:  [facility], [address], [place], [place] [address]      Provider Number: [id number]  Attending Physician Name and Address:  Loletha Grayer, MD  Relative Name and Phone Number:  Kaylem Gidney (wife) [phone]    Current Level of Care: SNF Recommended Level of Care: [place] Prior Approval Number:    Date Approved/Denied:   PASRR Number:    Discharge Plan: SNF    Current Diagnoses: Patient Active Problem List   Diagnosis Date Noted  . SBO (small bowel obstruction) ([place]) 10/06/2020  . Diabetes mellitus without complication ([place])   . Hypertension   . History of renal transplant   . Aspiration pneumonia ([place])     Orientation RESPIRATION BLADDER Height & Weight        Normal Incontinent Weight: 68.4 kg Height:  6' (182.9 cm)  BEHAVIORAL SYMPTOMS/MOOD NEUROLOGICAL BOWEL NUTRITION STATUS      Incontinent Diet (see discharge summary)  AMBULATORY STATUS COMMUNICATION OF NEEDS Skin   Total Care Verbally Normal                       Personal Care Assistance Level of Assistance  Total care       Total Care Assistance: Maximum assistance   Functional Limitations Info             SPECIAL CARE FACTORS FREQUENCY                       Contractures Contractures Info: Not present    Additional Factors Info  Code Status,Allergies Code Status Info: Full Allergies Info: Hylan G-f20, latex, IBAND           Current Medications (10/07/2020):  This is the current hospital active medication list Current Facility-Administered Medications  Medication Dose Route Frequency Provider Last Rate Last Admin  . 0.9 %  sodium chloride infusion   Intravenous Continuous Collier Bullock, MD   Held at 10/06/20 1011  . Ampicillin-Sulbactam (UNASYN) 3 g in sodium chloride 0.9 % 100 mL IVPB  3 g Intravenous Q6H Beers, Shanon Brow, RPH 200 mL/hr at 10/07/20 0910 3 g at 10/07/20 0910  . enoxaparin (LOVENOX) injection 40 mg  40 mg Subcutaneous Q24H Agbata, Tochukwu, MD   40 mg at 10/07/20 0957  . hydrALAZINE (APRESOLINE) injection 10 mg  10 mg Intravenous Q6H PRN Agbata, Tochukwu, MD   10 mg at 10/06/20 1017  . insulin aspart (novoLOG) injection 0-15 Units  0-15 Units Subcutaneous Q4H Agbata, Tochukwu, MD   2 Units at 10/07/20 0508  . iohexol (OMNIPAQUE) 300 MG/ML solution 100 mL  100 mL Intravenous Once PRN Alfred Levins, [place], MD      . methylPREDNISolone sodium succinate (SOLU-MEDROL) 40 mg/mL injection 20 mg  20 mg Intravenous Daily Agbata, Tochukwu, MD   20 mg at 10/07/20 0910  . morphine 2 MG/ML injection 2 mg  2 mg Intravenous Q4H PRN Agbata, Tochukwu, MD      . ondansetron (ZOFRAN) tablet 4 mg  4 mg Oral Q6H PRN Agbata, Tochukwu, MD       Or  . ondansetron (ZOFRAN) injection 4 mg  4 mg Intravenous Q6H PRN Agbata, Tochukwu, MD      . pantoprazole (  PROTONIX) injection 40 mg  40 mg Intravenous Q24H Agbata, Tochukwu, MD   40 mg at 10/07/20 4742     Discharge Medications: Please see discharge summary for a list of discharge medications.  Relevant Imaging Results:  Relevant Lab Results:   Additional Information SS# [id number]  Shelbie Hutching, RN

## 2020-10-07 NOTE — TOC Initial Note (Signed)
Transition of Care [facility]) - Initial/Assessment Note    Patient Details  Name: James Hicks MRN: [id number] Date of Birth: [birth year]  Transition of Care [facility]) CM/SW Contact:    James Hutching, RN Phone Number: 10/07/2020, 12:08 PM  Clinical Narrative:                 Patient admitted to the [facility] with small bowel obstruction.  RNCM was able to meet with patient and patient's wife, James Hicks at the bedside this morning.  Patient is from [facility] long term care.  Wife reports that he has been there about 5 months.  Patient has a history of dementia and subdural hematoma.  Patient is not oriented but is alert.  Wife is in agreement with patient returning to [facility] once medically stable.   TOC will cont to follow patient through hospitalization.   Expected Discharge Plan: Skilled Nursing Facility Barriers to Discharge: Continued Medical Work up   Patient Goals and CMS Choice Patient states their goals for this hospitalization and ongoing recovery are:: Patient to discharge back to [facility] when medically ready CMS Medicare.gov Compare Post Acute Care list provided to:: Patient Represenative (must comment) Choice offered to / list presented to : Spouse  Expected Discharge Plan and Services Expected Discharge Plan: [place]   Discharge Planning Services: CM Consult Post Acute Care Choice: [place] Living arrangements for the past 2 months: [place]                 DME Arranged: N/A DME Agency: NA       HH Arranged: NA          Prior Living Arrangements/Services Living arrangements for the past 2 months: [place] Lives with:: Facility Resident Patient language and need for interpreter reviewed:: Yes Do you feel safe going back to the place where you live?: Yes      Need for Family Participation in Patient Care: Yes (Comment) (LTC at [facility]) Care giver support system in place?: Yes (comment) (wife)    Criminal Activity/Legal Involvement Pertinent to Current Situation/Hospitalization: No - Comment as needed  Activities of Daily Living Home Assistive Devices/Equipment: Hoyer Lift,Built-in shower seat,Bathtub lift ADL Screening (condition at time of admission) Patient's cognitive ability adequate to safely complete daily activities?: No Is the patient deaf or have difficulty hearing?: No Does the patient have difficulty seeing, even when wearing glasses/contacts?: Yes Does the patient have difficulty concentrating, remembering, or making decisions?: Yes Patient able to express need for assistance with ADLs?: No Does the patient have difficulty dressing or bathing?: Yes Independently performs ADLs?: No Communication: Needs assistance Is this a change from baseline?: Pre-admission baseline Dressing (OT): Dependent Is this a change from baseline?: Pre-admission baseline Grooming: Dependent Is this a change from baseline?: Pre-admission baseline Feeding: Dependent Is this a change from baseline?: Pre-admission baseline Bathing: Dependent Is this a change from baseline?: Pre-admission baseline Toileting: Dependent Is this a change from baseline?: Pre-admission baseline In/Out Bed: Dependent Is this a change from baseline?: Pre-admission baseline Does the patient have difficulty walking or climbing stairs?: Yes Weakness of Legs: Left Weakness of Arms/Hands: Both  Permission Sought/Granted Permission sought to [facility] information with : Case Manager,Facility Contact Representative,Family Supports Permission granted to [facility] information with : Yes, Verbal Permission Granted  [facility] Information with NAME: James Hicks  Permission granted to [facility] info w AGENCY: [facility]  Permission granted to [facility] info w Relationship: wife  Permission granted to [facility]  info w Contact Information: [phone]  Emotional Assessment Appearance:: Appears stated age Attitude/Demeanor/Rapport: Unable  to Assess Affect (typically observed): Happy   Alcohol / Substance Use: Not Applicable Psych Involvement: No (comment)  Admission diagnosis:  Small bowel obstruction (HCC) [K56.609] SBO (small bowel obstruction) ([place]) [K56.609] Aspiration pneumonia of left lung due to gastric secretions, unspecified part of lung ([place]) [J69.0] Patient Active Problem List   Diagnosis Date Noted  . SBO (small bowel obstruction) ([place]) 10/06/2020  . Diabetes mellitus without complication ([place])   . Hypertension   . History of renal transplant   . Aspiration pneumonia ([place])    PCP:  James Late, MD Pharmacy:   [id number] - [place], [place] - [address] [place] [address] Phone: [phone] Fax: [phone]  [facility] ([facility]) [place], [place] [address] Phone: [phone] Fax: [phone]     Social Determinants of Health (SDOH) Interventions    Readmission Risk Interventions No flowsheet data found.

## 2020-10-07 NOTE — Plan of Care (Signed)
Pt disoriented x 4, Resting in bed calm. Pt continued on NPO. IV fluids infusing. Incontinence care provide. Pt appears in no distress. No adverse events or acute changes. Wife at bedside this afternoon. Will continue to monitor           Problem: Pain Managment: Goal: General experience of comfort will improve Outcome: Progressing   Problem: Safety: Goal: Ability to remain free from injury will improve Outcome: Progressing   Problem: Skin Integrity: Goal: Risk for impaired skin integrity will decrease Outcome: Progressing

## 2020-10-08 ENCOUNTER — Inpatient Hospital Stay: Payer: Medicare Other

## 2020-10-08 DIAGNOSIS — R935 Abnormal findings on diagnostic imaging of other abdominal regions, including retroperitoneum: Secondary | ICD-10-CM

## 2020-10-08 DIAGNOSIS — K56609 Unspecified intestinal obstruction, unspecified as to partial versus complete obstruction: Principal | ICD-10-CM

## 2020-10-08 LAB — BASIC METABOLIC PANEL
Anion gap: 10 (ref 5–15)
BUN: 35 mg/dL — ABNORMAL HIGH (ref 8–23)
CO2: 25 mmol/L (ref 22–32)
Calcium: 9.1 mg/dL (ref 8.9–10.3)
Chloride: 107 mmol/L (ref 98–111)
Creatinine, Ser: 0.92 mg/dL (ref 0.61–1.24)
GFR, Estimated: >60 mL/min (ref >60)
Glucose, Bld: 88 mg/dL (ref 70–99)
Potassium: 3.7 mmol/L (ref 3.5–5.1)
Sodium: 142 mmol/L (ref 135–145)

## 2020-10-08 LAB — GLUCOSE, CAPILLARY
Glucose-Capillary: 101 mg/dL — ABNORMAL HIGH (ref 70–99)
Glucose-Capillary: 115 mg/dL — ABNORMAL HIGH (ref 70–99)
Glucose-Capillary: 119 mg/dL — ABNORMAL HIGH (ref 70–99)
Glucose-Capillary: 78 mg/dL (ref 70–99)
Glucose-Capillary: 91 mg/dL (ref 70–99)
Glucose-Capillary: 94 mg/dL (ref 70–99)
Glucose-Capillary: 97 mg/dL (ref 70–99)

## 2020-10-08 LAB — CBC
HCT: 32.1 % — ABNORMAL LOW (ref 39.0–52.0)
Hemoglobin: 9.8 g/dL — ABNORMAL LOW (ref 13.0–17.0)
MCH: 25.9 pg — ABNORMAL LOW (ref 26.0–34.0)
MCHC: 30.5 g/dL (ref 30.0–36.0)
MCV: 84.9 fL (ref 80.0–100.0)
Platelets: 232 10*3/uL (ref 150–400)
RBC: 3.78 MIL/uL — ABNORMAL LOW (ref 4.22–5.81)
RDW: 15.7 % — ABNORMAL HIGH (ref 11.5–15.5)
WBC: 11.4 10*3/uL — ABNORMAL HIGH (ref 4.0–10.5)
nRBC: 0 % (ref 0.0–0.2)

## 2020-10-08 LAB — URINE CULTURE: Culture: NO GROWTH

## 2020-10-08 NOTE — Consult Note (Signed)
James Hicks , MD [address], [place], [place], [place], [address], [place], [place], [place], [address] Phone: [phone]  Fax: [phone]  Consultation  Referring Provider:    Dr Leslye Peer Primary Care Physician:  Derinda Late, MD Primary Gastroenterologist:  Dr. Alice Reichert         Reason for Consultation:     Gastric pneumatosis requiring endoscopic evaluation  Date of Admission:  10/06/2020 Date of Consultation:  10/08/2020         HPI:   James Hicks is a 74 y.o. male was admitted to the hospital on 10/06/2020 with abdominal pain.  He has a history of dementia and admitted with emesis and multiple bowel movements.  I have obtained his history from prior notes.  Multiple abdominal surgeries in the past.  On admission had leukocytosis elevation of troponin.  CT scan of the abdomen performed 2 days back demonstrated distended stomach with diffuse pneumatosis tracking superiorly along the esophagus.  The pneumatosis was suggested to be benign from distention and vomiting or could be from ischemic.  Distended proximal small bowel loops with air-fluid level occult obstruction versus gastroenteritis.  Concern for presumed aspiration.  X-ray of the abdomen performed today shows continued presence of gastric pneumatosis no abnormal bowel dilation is noted. Labs from today demonstrate normal CO2, creatinine of 0.92 which is improved from 2 days back of 1.14.  Normal electrolytes.  White cell count has trended down from 28.3-11.4.  He was seen by Dr. Christian Mate and Dr. Ferrel Logan earlier today I have been asked to see the patient for evaluation of the gastric mucosa.  When I went to please see the patient he was  along with his wife.  The patient denies any abdominal pain today feels much better today.  According to wife patient also looks and feels better today.  He does say he has a bowel movement but his memory is impaired  Past Medical History:  Diagnosis Date  . Cholelithiasis   .  Chronic renal failure    kidney transplant   . Colon polyps   . CVA (cerebral infarction)   . Degenerative joint disease   . Diabetes mellitus without complication ([place])   . Diabetic neuropathy ([place])   . Hydronephrosis of right kidney   . Hyperlipidemia   . Hypertension   . Renal cell carcinoma ([place])   . Stroke [facility])     Past Surgical History:  Procedure Laterality Date  . CATARACT EXTRACTION    . CHOLECYSTECTOMY    . COLONOSCOPY    . COLONOSCOPY WITH PROPOFOL N/A 05/28/2018   Procedure: COLONOSCOPY WITH PROPOFOL;  Surgeon: Toledo, Benay Pike, MD;  Location: [facility] ENDOSCOPY;  Service: Gastroenterology;  Laterality: N/A;  . KIDNEY TRANSPLANT    . KNEE ARTHROSCOPY    . NEPHRECTOMY    . TONSILLECTOMY    . WISDOM TOOTH EXTRACTION      Prior to Admission medications   Medication Sig Start Date End Date Taking? Authorizing Provider  allopurinol (ZYLOPRIM) 100 MG tablet Take 100 mg by mouth daily.   Yes [provider]  atorvastatin (LIPITOR) 10 MG tablet Take 10 mg by mouth daily.   Yes [provider]  calcium-vitamin D (OSCAL WITH D) 500-200 MG-UNIT tablet Take 1 tablet by mouth 2 (two) times daily.   Yes [provider]  DEPAKOTE SPRINKLES 125 MG capsule Take 125 mg by mouth 2 (two) times daily. 09/26/20  Yes [provider]  diclofenac Sodium (  VOLTAREN) 1 % GEL Apply 2 g topically 4 (four) times daily. To right shoulder and lower back   Yes [provider]  DULCOLAX 5 MG EC tablet Take 5 mg by mouth 2 (two) times daily as needed. 09/26/20  Yes [provider]  FLOMAX 0.4 MG CAPS capsule Take 0.8 mg by mouth at bedtime. 09/26/20  Yes [provider]  hydrochlorothiazide (HYDRODIURIL) 25 MG tablet Take 25 mg by mouth daily.   Yes [provider]  insulin glargine (LANTUS) 100 UNIT/ML injection Inject 15 Units into the skin daily.   Yes [provider]  insulin lispro (HUMALOG) 100 UNIT/ML injection Inject  6 Units into the skin 3 (three) times daily. 0730, 1130, 1630   Yes [provider]  MELATONIN MAXIMUM STRENGTH 5 MG TABS Take 5 mg by mouth at bedtime. 09/26/20  Yes [provider]  metoprolol succinate (TOPROL-XL) 25 MG 24 hr tablet Take 25 mg by mouth daily. 07/31/18  Yes [provider]  mycophenolate (CELLCEPT) 500 MG tablet Take 500 mg by mouth 2 (two) times daily.   Yes [provider]  NIFEdipine (PROCARDIA-XL/NIFEDICAL-XL) 30 MG 24 hr tablet Take 30 mg by mouth daily.   Yes [provider]  predniSONE (DELTASONE) 10 MG tablet Take 1 tablet (10 mg total) by mouth daily. 07/05/19  Yes Cuthriell, Charline Bills, PA-C  SENNA PLUS 8.6-50 MG tablet Take 1 tablet by mouth daily. 09/26/20  Yes [provider]  SEROQUEL 25 MG tablet Take 25 mg by mouth daily. 09/26/20  Yes [provider]  SEROQUEL 50 MG tablet Take 100 mg by mouth at bedtime. 09/26/20  Yes [provider]  sodium bicarbonate 650 MG tablet Take 650 mg by mouth 2 (two) times daily. 09/26/20  Yes [provider]  tacrolimus (PROGRAF) 1 MG capsule Take 5 mg by mouth daily. 0900   Yes [provider]  tacrolimus (PROGRAF) 1 MG capsule Take 6 mg by mouth at bedtime. 2100   Yes [provider]  traZODone (DESYREL) 50 MG tablet Take 100 mg by mouth at bedtime. 09/26/20  Yes [provider]  TYLENOL 325 MG tablet Take 650 mg by mouth every 6 (six) hours as needed. 09/26/20  Yes [provider]  insulin aspart (NOVOLOG) 100 UNIT/ML injection Inject 1-7 Units into the skin 4 (four) times daily. Per Sliding Scale ( 150-200: 1u, 201-249: 2u, 250-299: 3u, 300-349: 4u, 350-399: 5u, If greater than 399: 7u. 09/26/20   [provider]  colchicine 0.6 MG tablet Take 0.6 mg by mouth daily. Patient not taking: Reported on 10/06/2020  10/07/20  [provider]  enalapril (VASOTEC) 20 MG tablet Take 20 mg by mouth  daily. Patient not taking: Reported on 10/06/2020  10/07/20  [provider]  furosemide (LASIX) 20 MG tablet Take 20 mg by mouth 2 (two) times daily. Patient not taking: Reported on 10/06/2020  10/07/20  [provider]    Family History  Problem Relation Age of Onset  . Colon cancer Mother   . Diabetes Mother   . Breast cancer Sister      Social History   Tobacco Use  . Smoking status: Never Smoker  . Smokeless tobacco: Never Used  Vaping Use  . Vaping Use: Never used  Substance Use Topics  . Alcohol use: Not Currently  . Drug use: Not Currently    Allergies as of 10/06/2020 - Review Complete 10/06/2020  Allergen Reaction Noted  . Hylan g-f  20 Anaphylaxis 03/05/2014  . Latex Hives 04/22/2013  . Other Anaphylaxis and Other (See Comments) 10/23/2015    Review of Systems:    All systems reviewed and negative except where noted in HPI.   Physical Exam:  Vital signs in last 24 hours: Temp:  [97.4 F (36.3 C)-99 F (37.2 C)] 97.4 F (36.3 C) (12/11 0813) Pulse Rate:  [65-81] 71 (12/11 0813) Resp:  [14-20] 14 (12/11 0813) BP: (148-183)/(49-87) 148/71 (12/11 0813) SpO2:  [100 %] 100 % (12/11 0813) Last BM Date:  (unknown) General:   Pleasant, cooperative in NAD Head:  Normocephalic and atraumatic. Eyes:   No icterus.   Conjunctiva pink. PERRLA. Ears:  Normal auditory acuity. Neck:  Supple; no masses or thyroidomegaly Lungs: Respirations even and unlabored. Lungs clear to auscultation bilaterally.   No wheezes, crackles, or rhonchi.  Heart:  Regular rate and rhythm;  Without murmur, clicks, rubs or gallops Abdomen:  Soft, nondistended, nontender. Normal bowel sounds. No appreciable masses or hepatomegaly.  No rebound or guarding.  Neurologic:  Alert and oriented x1. Skin:  Intact without significant lesions or rashes. Cervical Nodes:  No significant cervical adenopathy. Psych:  Alert and appears comfortable  LAB RESULTS: Recent Labs     10/06/20 0316 10/07/20 0906 10/08/20 0343  WBC 28.3* 18.8* 11.4*  HGB 11.8* 10.9* 9.8*  HCT 37.2* 34.0* 32.1*  PLT 273 256 232   BMET Recent Labs    10/06/20 0250 10/07/20 0906 10/08/20 0343  NA 135 139 142  K 4.3 3.9 3.7  CL 94* 100 107  CO2 30 30 25   GLUCOSE 208* 103* 88  BUN 40* 37* 35*  CREATININE 1.14 0.96 0.92  CALCIUM 9.4 9.7 9.1   LFT Recent Labs    10/06/20 0250  PROT 7.3  ALBUMIN 3.3*  AST 23  ALT 13  ALKPHOS 65  BILITOT 0.9   PT/INR No results for input(s): LABPROT, INR in the last 72 hours.  STUDIES: DG Abd 2 Views  Result Date: 10/08/2020 CLINICAL DATA:  Small bowel obstruction. EXAM: ABDOMEN - 2 VIEW COMPARISON:  October 07, 2020.  October 06, 2020. FINDINGS: Continued presence of gastric pneumatosis. No abnormal bowel dilatation is noted. Status post cholecystectomy. Surgical clips are seen in the right side of the abdomen and right lower quadrant. IMPRESSION: Continued presence of gastric pneumatosis. No abnormal bowel dilatation is noted. Electronically Signed   By: Marijo Conception M.D.   On: 10/08/2020 08:28   DG Abd 2 Views  Result Date: 10/07/2020 CLINICAL DATA:  Small-bowel obstruction. EXAM: ABDOMEN - 2 VIEW COMPARISON:  CT 10/06/2020. FINDINGS: Surgical clips noted over the abdomen. Gastric distention and gastric pneumatosis again noted. Previously identified small bowel distention noted on CT of 10/06/2020 not identified on this standard abdomen series. Follow-up CT can be obtained as needed. No free air identified. Degenerative change lumbar spine and both hips. IMPRESSION: Gastric distention and gastric pneumatosis again noted. Previously identified small bowel distention noted on CT of 10/06/2020 not identified on this standard abdomen series. Follow-up CT can be obtained as needed. Electronically Signed   By: Marcello Moores  Register   On: 10/07/2020 07:03      Impression / Plan:   HOSAM MCFETRIDGE is a 74 y.o. y/o male with a history of dementia  presented with nausea vomiting abdominal pain and was found to have pneumatosis.  He has a history of diabetes hyperlipidemia hypertension and CKD.  His initial clinical presentation is very suggestive of gastroenteritis.  I  have been consulted for endoscopic evaluation for pneumatosis seen in stomach, esophagus and small bowel ileus. .  Etiology of gastric pneumatosis could be secondary to bacterial production versus gas of pulmonary origin.  Associated with steroid use [high-dose], acute gastric distention can result in gastric emphysema.  It is also associated with ingestion of caustic substances which is less likely in his case.  It has also been associated with performing endoscopic procedures which lead to mobilization of intraluminal gas to the submucous spaces.  Management of this condition is conservative.  Which would include nasogastric catheter for decompression, rehydration and use of antibiotics if infection is suspected.    With regards to performing an endoscopy in a patient who has dementia and has evidence of the small bowel  ileus and gas in the walls of the stomach tracking to the esophagus: It would be a contraindication to pass an instrument into the stomach and inflated the lumen in this setting.  This could likely lead to perforation.  In addition the information gained by endoscopy will not change management.  If the patient has gastritis that would be treated by PPI which he is already on, if he has ischemia that would be treated with fluids which he is already on.  A gastric infection causing gas producing bacteria would not be detected by endoscopy.  I would suggest to continue conservative management with serial abdominal exams consider reimaging in 24 to 48 hours, follow serum lactate levels and white cell counts.  The fact that his white cell count has improved and he appears from documentation to have improved clinically would make me less inclined to consider an endoscopy  evaluation as the risks from the procedure exceed the benefits.  Presently he has a very benign abdomen with good bowel sounds.  I will sign off.  Please call me if any further GI concerns or questions.  We would like to thank you for the opportunity to participate in the care of James Hicks.   Thank you for involving me in the care of this patient.      LOS: 2 days   James Bellows, MD  10/08/2020, 12:04 PM

## 2020-10-08 NOTE — Progress Notes (Signed)
Patient ID: James Hicks, male   DOB: 06-27-1946, 74 y.o.   MRN: [id number] [facility] PROGRESS NOTE  JERE BOSTROM XAJ:[id number] DOB: 1946/09/04 DOA: 10/06/2020 PCP: Derinda Late, MD  HPI/Subjective: Patient with vascular dementia.  He said yes when I asked him if he had any abdominal pain.  Patient did not grimace when I palpated his abdomen though.  As per nursing staff no bowel movement yet.  Objective: Vitals:   10/08/20 0727 10/08/20 0813  BP: (!) 156/87 (!) 148/71  Pulse: 75 71  Resp: 18 14  Temp: 98.3 F (36.8 C) (!) 97.4 F (36.3 C)  SpO2: 100% 100%    Intake/Output Summary (Last 24 hours) at 10/08/2020 1143 Last data filed at 10/08/2020 0700 Gross per 24 hour  Intake --  Output 775 ml  Net -775 ml   Filed Weights   10/06/20 0243 10/06/20 2131  Weight: 72.6 kg 68.4 kg    ROS: Review of Systems  Unable to perform ROS: Dementia  Respiratory: Negative for shortness of breath.   Cardiovascular: Negative for chest pain.  Gastrointestinal: Positive for abdominal pain.   Exam: Physical Exam HENT:     Head: Normocephalic.     Mouth/Throat:     Pharynx: No oropharyngeal exudate.  Eyes:     General: Lids are normal.     Conjunctiva/sclera: Conjunctivae normal.     Pupils: Pupils are equal, round, and reactive to light.  Cardiovascular:     Rate and Rhythm: Normal rate and regular rhythm.     Heart sounds: Normal heart sounds, S1 normal and S2 normal.  Pulmonary:     Breath sounds: No decreased breath sounds, wheezing, rhonchi or rales.  Abdominal:     General: Bowel sounds are decreased.     Palpations: Abdomen is soft.     Tenderness: There is no abdominal tenderness.  Musculoskeletal:     Right lower leg: No swelling.     Left lower leg: No swelling.  Skin:    General: Skin is warm.     Findings: No rash.  Neurological:     Mental Status: He is alert.     Comments: Answers some questions.       Data Reviewed: Basic Metabolic  Panel: Recent Labs  Lab 10/06/20 0250 10/07/20 0906 10/08/20 0343  NA 135 139 142  K 4.3 3.9 3.7  CL 94* 100 107  CO2 30 30 25   GLUCOSE 208* 103* 88  BUN 40* 37* 35*  CREATININE 1.14 0.96 0.92  CALCIUM 9.4 9.7 9.1   Liver Function Tests: Recent Labs  Lab 10/06/20 0250  AST 23  ALT 13  ALKPHOS 65  BILITOT 0.9  PROT 7.3  ALBUMIN 3.3*   Recent Labs  Lab 10/06/20 0250  LIPASE 25   CBC: Recent Labs  Lab 10/06/20 0316 10/07/20 0906 10/08/20 0343  WBC 28.3* 18.8* 11.4*  NEUTROABS 24.7*  --   --   HGB 11.8* 10.9* 9.8*  HCT 37.2* 34.0* 32.1*  MCV 81.8 83.3 84.9  PLT 273 256 232    CBG: Recent Labs  Lab 10/07/20 1600 10/07/20 2048 10/08/20 0056 10/08/20 0417 10/08/20 0814  GLUCAP 151* 136* 78 97 94    Recent Results (from the past 240 hour(s))  Blood culture (routine x 2)     Status: None (Preliminary result)   Collection Time: 10/06/20  7:55 AM   Specimen: BLOOD  Result Value Ref Range Status   Specimen Description BLOOD BLOOD RIGHT HAND  Final   Special Requests   Final    BOTTLES DRAWN AEROBIC AND ANAEROBIC Blood Culture adequate volume   Culture   Final    NO GROWTH 2 DAYS Performed at [facility], [address] [place], [place] [address]    Report Status PENDING  Incomplete  Blood culture (routine x 2)     Status: None (Preliminary result)   Collection Time: 10/06/20  8:02 AM   Specimen: BLOOD  Result Value Ref Range Status   Specimen Description BLOOD BLOOD RIGHT ARM  Final   Special Requests   Final    BOTTLES DRAWN AEROBIC AND ANAEROBIC Blood Culture adequate volume   Culture   Final    NO GROWTH 2 DAYS Performed at [facility], [place], [address], [place], [place] [address]    Report Status PENDING  Incomplete  Resp Panel by RT-PCR (Flu A&B, Covid) Nasopharyngeal Swab     Status: None   Collection Time: 10/06/20  4:27 PM   Specimen: Nasopharyngeal Swab; Nasopharyngeal(NP) swabs in vial transport medium  Result  Value Ref Range Status   SARS Coronavirus 2 by RT PCR NEGATIVE NEGATIVE Final    Comment: (NOTE) SARS-CoV-2 target nucleic acids are NOT DETECTED.  The SARS-CoV-2 RNA is generally detectable in upper respiratory specimens during the acute phase of infection. The lowest concentration of SARS-CoV-2 viral copies this assay can detect is 138 copies/mL. A negative result does not preclude SARS-Cov-2 infection and should not be used as the sole basis for treatment or other patient management decisions. A negative result may occur with  improper specimen collection/handling, submission of specimen other than nasopharyngeal swab, presence of viral mutation(s) within the areas targeted by this assay, and inadequate number of viral copies(<138 copies/mL). A negative result must be combined with clinical observations, patient history, and epidemiological information. The expected result is Negative.  Fact Sheet for Patients:  EntrepreneurPulse.com.au  Fact Sheet for Healthcare Providers:  IncredibleEmployment.be  This test is no t yet approved or cleared by the [place] FDA and  has been authorized for detection and/or diagnosis of SARS-CoV-2 by FDA under an Emergency Use Authorization (EUA). This EUA will remain  in effect (meaning this test can be used) for the duration of the COVID-19 declaration under Section 564(b)(1) of the Act, 21 U.S.C.section 360bbb-3(b)(1), unless the authorization is terminated  or revoked sooner.       Influenza A by PCR NEGATIVE NEGATIVE Final   Influenza B by PCR NEGATIVE NEGATIVE Final    Comment: (NOTE) The Xpert Xpress SARS-CoV-2/FLU/RSV plus assay is intended as an aid in the diagnosis of influenza from Nasopharyngeal swab specimens and should not be used as a sole basis for treatment. Nasal washings and aspirates are unacceptable for Xpert Xpress SARS-CoV-2/FLU/RSV testing.  Fact Sheet for  Patients: EntrepreneurPulse.com.au  Fact Sheet for Healthcare Providers: IncredibleEmployment.be  This test is not yet approved or cleared by the [place] FDA and has been authorized for detection and/or diagnosis of SARS-CoV-2 by FDA under an Emergency Use Authorization (EUA). This EUA will remain in effect (meaning this test can be used) for the duration of the COVID-19 declaration under Section 564(b)(1) of the Act, 21 U.S.C. section 360bbb-3(b)(1), unless the authorization is terminated or revoked.  Performed at [facility], [address] [place],  [address]      Studies: DG Abd 2 Views  Result Date: 10/08/2020 CLINICAL DATA:  Small bowel obstruction. EXAM: ABDOMEN - 2 VIEW COMPARISON:  October 07, 2020.  October 06, 2020. FINDINGS: Continued presence of gastric pneumatosis. No abnormal bowel dilatation is noted. Status post cholecystectomy. Surgical clips are seen in the right side of the abdomen and right lower quadrant. IMPRESSION: Continued presence of gastric pneumatosis. No abnormal bowel dilatation is noted. Electronically Signed   By: Marijo Conception M.D.   On: 10/08/2020 08:28   DG Abd 2 Views  Result Date: 10/07/2020 CLINICAL DATA:  Small-bowel obstruction. EXAM: ABDOMEN - 2 VIEW COMPARISON:  CT 10/06/2020. FINDINGS: Surgical clips noted over the abdomen. Gastric distention and gastric pneumatosis again noted. Previously identified small bowel distention noted on CT of 10/06/2020 not identified on this standard abdomen series. Follow-up CT can be obtained as needed. No free air identified. Degenerative change lumbar spine and both hips. IMPRESSION: Gastric distention and gastric pneumatosis again noted. Previously identified small bowel distention noted on CT of 10/06/2020 not identified on this standard abdomen series. Follow-up CT can be obtained as needed. Electronically Signed   By: [place]   On:  10/07/2020 07:03    Scheduled Meds: . enoxaparin (LOVENOX) injection  40 mg Subcutaneous Q24H  . insulin aspart  0-15 Units Subcutaneous Q4H  . metoprolol succinate  25 mg Oral Daily  . mycophenolate  500 mg Oral BID  . pantoprazole (PROTONIX) IV  40 mg Intravenous Q24H  . predniSONE  10 mg Oral Daily  . QUEtiapine  100 mg Oral QHS  . tacrolimus  5 mg Oral Daily  . tacrolimus  6 mg Oral QHS  . traZODone  100 mg Oral QHS   Continuous Infusions: . sodium chloride Stopped (10/06/20 1011)  . ampicillin-sulbactam (UNASYN) IV 3 g (10/08/20 0829)    Assessment/Plan:  1. Small bowel obstruction with dilated stomach and pneumatosis.  Case discussed with general surgery and they recommended a GI consultation.  Conservative management as per general surgery.  Patient still n.p.o. and on IV fluids.  Patient has had numerous abdominal surgeries in the past.  Empirically on Unasyn. 2. Aspiration pneumonia and leukocytosis on empiric Unasyn.  White blood cell count trending better from 28.3 down to 11.4 today. 3. History of kidney transplant.  Continue immunosuppressive medications orally.  Prednisone tacrolimus and CellCept.  Tacrolimus level ordered for today. 4. Essential hypertension on low-dose Toprol 5. Lactic acidosis on IV fluids 6. Vascular dementia on Seroquel at night 7. Weakness.  Physical therapy evaluation 8. History of subdural hematoma     Code Status:     Code Status Orders  (From admission, onward)         Start     Ordered   10/06/20 0929  Full code  Continuous        10/06/20 0930        Code Status History    This patient has a current code status but no historical code status.   Advance Care Planning Activity     Family Communication: Spoke with patient's wife on the phone Disposition Plan: Status is: Inpatient  Dispo: The patient is from: [facility]              Anticipated d/c is to: [facility]              Anticipated d/c date is: Likely  another 3 or 4 days need to make sure small bowel obstruction resolves.              Patient currently n.p.o. for treatment of small bowel obstruction.  Consultants:  General surgery  Antibiotics:  Unasyn  Time spent: 27 minutes.  Case discussed with general surgery and gastroenterology.  [place]  [facility] [employer]

## 2020-10-08 NOTE — Progress Notes (Signed)
Pt being transferred to [facility] room 202. Report given to [place].

## 2020-10-08 NOTE — Plan of Care (Signed)

## 2020-10-08 NOTE — Evaluation (Signed)
Physical Therapy Evaluation Patient Details Name: James Hicks MRN: [id number] DOB: 11-22-1945 Today's Date: 10/08/2020   History of Present Illness  Patient is a 74 year old African-American male who was sent to the ER from the skilled nursing facility where he resides for evaluation of abdominal pain and emesis concerning for small bowel obstruction.  CT scan of abdomen and pelvis shows a distended stomach with diffuse pneumatosis and distended proximal small bowel loops without fluid level.  He also has a left lower lobe pneumonia presumed to be secondary to aspiration.  Surgery was consulted in the ER and patient will be admitted to the hospital for evaluation. PMH: CVA, renal transplant, DM, HTN, dyslipidemia, DJD, neuropathy, renal cell carcinoma    Clinical Impression  [name] present in room, reporting pt is bed level PTA at [facility]. Pt only oriented to self. Willing to transfer supine<>sit EOB with total assist +2 but fearful of falling & requests to lie back down. [name] reports pt is at baseline level of function. No further PT needs at this time.     Follow Up Recommendations No PT follow up    Equipment Recommendations   (TBD in next venue)    Recommendations for Other Services       Precautions / Restrictions Precautions Precautions: Fall Restrictions Weight Bearing Restrictions: No      Mobility  Bed Mobility Overal bed mobility: Needs Assistance Bed Mobility: Supine to Sit;Sit to Supine     Supine to sit: +2 for safety/equipment;Total assist;HOB elevated Sit to supine: Total assist;+2 for safety/equipment;HOB elevated   General bed mobility comments: does not initiate supine<>sit, fearful of movement    Transfers                    Ambulation/Gait                Stairs            Wheelchair Mobility    Modified Rankin (Stroke Patients Only)       Balance Overall balance assessment: Mild deficits observed, not  formally tested;Needs assistance   Sitting balance-Leahy Scale: Zero Sitting balance - Comments: total assist +2 sitting EOB                                     Pertinent Vitals/Pain Pain Assessment: Faces Faces Pain Scale: No hurt    Home Living Family/patient expects to be discharged to:: Skilled nursing facility                 Additional Comments: pt received from [facility] & plans to d/c back there    Prior Function           Comments: per wife, pt was able to tolerate sitting in geri chair & transferred bed<>w/c with hoyer lift, bed level     Hand Dominance        Extremity/Trunk Assessment   Upper Extremity Assessment Upper Extremity Assessment: Generalized weakness    Lower Extremity Assessment Lower Extremity Assessment: Generalized weakness       Communication      Cognition Arousal/Alertness: Awake/alert Behavior During Therapy: WFL for tasks assessed/performed Overall Cognitive Status: History of cognitive impairments - at baseline  General Comments: Pt pleasant throughout session, fearful of falling when assisted to EOB, confused (not oriented to location, situation); pt with hx of dementia & brain bleed      General Comments      Exercises     Assessment/Plan    PT Assessment Patent does not need any further PT services  PT Problem List         PT Treatment Interventions      PT Goals (Current goals can be found in the Care Plan section)  Acute Rehab PT Goals Patient Stated Goal: [facility] to [facility] PT Goal Formulation: With family Time For Goal Achievement: 10/22/20 Potential to Achieve Goals: Good    Frequency     Barriers to discharge        Co-evaluation               AM-PAC PT "6 Clicks" Mobility  Outcome Measure Help needed turning from your back to your side while in a flat bed without using bedrails?: Total Help needed moving from  lying on your back to sitting on the side of a flat bed without using bedrails?: Total Help needed moving to and from a bed to a chair (including a wheelchair)?: Total Help needed standing up from a chair using your arms (e.g., wheelchair or bedside chair)?: Total Help needed to walk in hospital room?: Total Help needed climbing 3-5 steps with a railing? : Total 6 Click Score: 6    End of Session   Activity Tolerance: Patient tolerated treatment well Patient left: in bed;with bed alarm set;with family/visitor present;with call bell/phone within reach        Time: 1749-4496 PT Time Calculation (min) (ACUTE ONLY): 13 min   Charges:   PT Evaluation $PT Eval Moderate Complexity: 1 Mod          Lavone Nian, PT, DPT 10/08/20, 4:23 PM   Waunita Schooner 10/08/2020, 4:17 PM

## 2020-10-08 NOTE — Progress Notes (Signed)
[facility] Hospital Day(s): 2.   Post op day(s):  Marland Kitchen   Interval History: Patient seen and examined, no acute events or new complaints overnight.  Nurse was at bedside during my evaluation.  There was no family members in the room.  The patient seems to be in no distress.  There has been no history of nausea or vomiting.  There is also no evidence of bowel function yet.  Patiently pleasantly demented.  History is not reliable due to dementia but when asked about pain he denies any pain at the moment.  Vital signs in last 24 hours: [min-max] current  Temp:  [97.4 F (36.3 C)-99 F (37.2 C)] 97.4 F (36.3 C) (12/11 0813) Pulse Rate:  [65-82] 71 (12/11 0813) Resp:  [14-20] 14 (12/11 0813) BP: (148-183)/(49-87) 148/71 (12/11 0813) SpO2:  [100 %] 100 % (12/11 0813)     Height: 6' (182.9 cm) Weight: 68.4 kg BMI (Calculated): 20.45   Physical Exam:  Constitutional: alert, cooperative and no distress  Respiratory: breathing non-labored at rest  Cardiovascular: regular rate and sinus rhythm  Gastrointestinal: soft, non-tender, and non-distended  Labs:  CBC Latest Ref Rng & Units 10/08/2020 10/07/2020 10/06/2020  WBC 4.0 - 10.5 K/uL 11.4(H) 18.8(H) 28.3(H)  Hemoglobin 13.0 - 17.0 g/dL 9.8(L) 10.9(L) 11.8(L)  Hematocrit 39.0 - 52.0 % 32.1(L) 34.0(L) 37.2(L)  Platelets 150 - 400 K/uL 232 256 273   CMP Latest Ref Rng & Units 10/08/2020 10/07/2020 10/06/2020  Glucose 70 - 99 mg/dL 88 103(H) 208(H)  BUN 8 - 23 mg/dL 35(H) 37(H) 40(H)  Creatinine 0.61 - 1.24 mg/dL 0.92 0.96 1.14  Sodium 135 - 145 mmol/L 142 139 135  Potassium 3.5 - 5.1 mmol/L 3.7 3.9 4.3  Chloride 98 - 111 mmol/L 107 100 94(L)  CO2 22 - 32 mmol/L 25 30 30   Calcium 8.9 - 10.3 mg/dL 9.1 9.7 9.4  Total Protein 6.5 - 8.1 g/dL - - 7.3  Total Bilirubin 0.3 - 1.2 mg/dL - - 0.9  Alkaline Phos 38 - 126 U/L - - 65  AST 15 - 41 U/L - - 23  ALT 0 - 44 U/L - - 13    Imaging studies: I personally evaluated the  abdominal x-ray.  There is a little bit less distention of the stomach.  There is persistent pneumatosis of the gastric wall.  No free air.   Assessment/Plan:  74 y.o. male found to have gastric distension and dependent pneumatosis of unclear etiology, likely benign, as his work up and examination are reassuring, complicated by pertinent comorbidities includingdementia, chronic renal failure, history of CVA, diabetes, hyperlipidemia, hypertension.  The patient today without any clinical deterioration.  The white blood cell count is in decreasing trend.  Hemoglobin stable.  Lactic acid 1 back to normal.  These are good signs that are against any acute ischemic issue.  There has been no significant change on the imaging.  I discussed the case with Dr. Christian Mate and hospitalist and will recommend evaluation by GI to see if upper endoscopy can be done for direct visualization of the gastric mucosa.  At this moment with no fever, improved white blood cell count, improved lactic acid there is no indication for surgical management.  We will continue to follow closely.  Agree with current management by primary team.  Recommend physical therapy evaluation and management.  [name], MD

## 2020-10-09 DIAGNOSIS — G9341 Metabolic encephalopathy: Secondary | ICD-10-CM

## 2020-10-09 DIAGNOSIS — R410 Disorientation, unspecified: Secondary | ICD-10-CM

## 2020-10-09 LAB — CBC
HCT: 33.2 % — ABNORMAL LOW (ref 39.0–52.0)
Hemoglobin: 10.2 g/dL — ABNORMAL LOW (ref 13.0–17.0)
MCH: 26 pg (ref 26.0–34.0)
MCHC: 30.7 g/dL (ref 30.0–36.0)
MCV: 84.5 fL (ref 80.0–100.0)
Platelets: 237 10*3/uL (ref 150–400)
RBC: 3.93 MIL/uL — ABNORMAL LOW (ref 4.22–5.81)
RDW: 15.4 % (ref 11.5–15.5)
WBC: 6.7 10*3/uL (ref 4.0–10.5)
nRBC: 0 % (ref 0.0–0.2)

## 2020-10-09 LAB — GLUCOSE, CAPILLARY
Glucose-Capillary: 112 mg/dL — ABNORMAL HIGH (ref 70–99)
Glucose-Capillary: 130 mg/dL — ABNORMAL HIGH (ref 70–99)
Glucose-Capillary: 144 mg/dL — ABNORMAL HIGH (ref 70–99)
Glucose-Capillary: 160 mg/dL — ABNORMAL HIGH (ref 70–99)
Glucose-Capillary: 192 mg/dL — ABNORMAL HIGH (ref 70–99)

## 2020-10-09 LAB — BASIC METABOLIC PANEL
Anion gap: 9 (ref 5–15)
BUN: 29 mg/dL — ABNORMAL HIGH (ref 8–23)
CO2: 26 mmol/L (ref 22–32)
Calcium: 8.9 mg/dL (ref 8.9–10.3)
Chloride: 109 mmol/L (ref 98–111)
Creatinine, Ser: 0.91 mg/dL (ref 0.61–1.24)
GFR, Estimated: >60 mL/min (ref >60)
Glucose, Bld: 133 mg/dL — ABNORMAL HIGH (ref 70–99)
Potassium: 3.7 mmol/L (ref 3.5–5.1)
Sodium: 144 mmol/L (ref 135–145)

## 2020-10-09 MED ORDER — QUETIAPINE FUMARATE 25 MG PO TABS
25.0000 mg | ORAL_TABLET | Freq: Every day | ORAL | Status: DC
  Administered 2020-10-09: 10:00:00 25 mg via ORAL
  Filled 2020-10-09 (×2): qty 1

## 2020-10-09 MED ORDER — DIVALPROEX SODIUM 125 MG PO CSDR
125.0000 mg | DELAYED_RELEASE_CAPSULE | Freq: Two times a day (BID) | ORAL | Status: DC
  Administered 2020-10-09 – 2020-10-11 (×5): 125 mg via ORAL
  Filled 2020-10-09 (×6): qty 1

## 2020-10-09 MED ORDER — HALOPERIDOL LACTATE 5 MG/ML IJ SOLN
1.0000 mg | Freq: Four times a day (QID) | INTRAMUSCULAR | Status: DC | PRN
  Administered 2020-10-11: 1 mg via INTRAVENOUS
  Filled 2020-10-09: qty 1

## 2020-10-09 NOTE — Progress Notes (Signed)
[facility] Hospital Day(s): 3.   Post op day(s):  Marland Kitchen   Interval History: Patient seen and examined, no acute events or new complaints overnight. Patient reports feeling well. His is at his baseline dementia but very cooperative and talkative.  Denies nausea or vomiting.  Denies any bowel movement.  No family members or staff in the room during my evaluation.  No bowel movement documented in the chart.  Vital signs in last 24 hours: [min-max] current  Temp:  [97.5 F (36.4 C)-98.6 F (37 C)] 98 F (36.7 C) (12/12 0700) Pulse Rate:  [54-76] 62 (12/12 0700) Resp:  [14-20] 16 (12/12 0700) BP: (131-174)/(64-86) 131/73 (12/12 0700) SpO2:  [97 %-100 %] 100 % (12/12 0700)     Height: 6' (182.9 cm) Weight: 68.4 kg BMI (Calculated): 20.45   Physical Exam:  Constitutional: alert, cooperative and no distress  Respiratory: breathing non-labored at rest  Cardiovascular: regular rate and sinus rhythm  Gastrointestinal: soft, non-tender, and non-distended  Labs:  CBC Latest Ref Rng & Units 10/09/2020 10/08/2020 10/07/2020  WBC 4.0 - 10.5 K/uL 6.7 11.4(H) 18.8(H)  Hemoglobin 13.0 - 17.0 g/dL 10.2(L) 9.8(L) 10.9(L)  Hematocrit 39.0 - 52.0 % 33.2(L) 32.1(L) 34.0(L)  Platelets 150 - 400 K/uL 237 232 256   CMP Latest Ref Rng & Units 10/09/2020 10/08/2020 10/07/2020  Glucose 70 - 99 mg/dL 133(H) 88 103(H)  BUN 8 - 23 mg/dL 29(H) 35(H) 37(H)  Creatinine 0.61 - 1.24 mg/dL 0.91 0.92 0.96  Sodium 135 - 145 mmol/L 144 142 139  Potassium 3.5 - 5.1 mmol/L 3.7 3.7 3.9  Chloride 98 - 111 mmol/L 109 107 100  CO2 22 - 32 mmol/L 26 25 30   Calcium 8.9 - 10.3 mg/dL 8.9 9.1 9.7  Total Protein 6.5 - 8.1 g/dL - - -  Total Bilirubin 0.3 - 1.2 mg/dL - - -  Alkaline Phos 38 - 126 U/L - - -  AST 15 - 41 U/L - - -  ALT 0 - 44 U/L - - -    Imaging studies: No new pertinent imaging studies   Assessment/Plan:  74 y.o.malefound to have gastric distension and dependent pneumatosis of unclear  etiology, likely benign, as his work up and examination are reassuring, complicated by pertinent comorbidities includingdementia, chronic renal failure, history of CVA, diabetes, hyperlipidemia, hypertension.  Patient without any clinical deterioration.  White blood cell count normalized.  Hemoglobin stable.  Abdominal physical exam benign.  No abdominal distention.  GI evaluated patient and no recommendation of endoscopy.  I will start patient with clear liquid diet and assess for toleration.  Agree with GI that most likely cause of pneumatosis of the stomach is benign and at this point there is no concern of ischemic event.  Will follow closely.   [name], MD

## 2020-10-09 NOTE — Progress Notes (Signed)
Patient ID: NORA ROOKE, male   DOB: 1945-12-28, 74 y.o.   MRN: [id number] [facility] PROGRESS NOTE  KHADEN GATER BOF:[id number] DOB: June 07, 1946 DOA: 10/06/2020 PCP: Derinda Late, MD  HPI/Subjective: As per nursing staff, the patient did not sleep very well last night.  They changed his room.  He received his nighttime Seroquel and trazodone but this morning was awake and yelling of the room.  I went into the room to speak with him.  He did not offer any complaints but stating that he is having people to come over so they can take him to the bank.  As per the wife, they just cleaned them up after having a bowel movement.  Objective: Vitals:   10/09/20 0548 10/09/20 0700  BP: (!) 174/68 131/73  Pulse: 65 62  Resp: 17 16  Temp: (!) 97.5 F (36.4 C) 98 F (36.7 C)  SpO2: 100% 100%    Intake/Output Summary (Last 24 hours) at 10/09/2020 1340 Last data filed at 10/09/2020 0700 Gross per 24 hour  Intake 996.67 ml  Output 2000 ml  Net -1003.33 ml   Filed Weights   10/06/20 0243 10/06/20 2131  Weight: 72.6 kg 68.4 kg    ROS: Review of Systems  Unable to perform ROS: Acuity of condition   Exam: Physical Exam HENT:     Head: Normocephalic.     Mouth/Throat:     Pharynx: No oropharyngeal exudate.  Eyes:     General: Lids are normal.     Conjunctiva/sclera: Conjunctivae normal.     Pupils: Pupils are equal, round, and reactive to light.  Cardiovascular:     Rate and Rhythm: Normal rate and regular rhythm.     Heart sounds: Normal heart sounds, S1 normal and S2 normal.  Pulmonary:     Breath sounds: No decreased breath sounds, wheezing, rhonchi or rales.  Abdominal:     Palpations: Abdomen is soft.     Tenderness: There is no abdominal tenderness.  Musculoskeletal:     Right lower leg: No swelling.     Left lower leg: No swelling.  Skin:    General: Skin is warm.     Comments: Skin tear right shin.  Neurological:     Mental Status: He is alert.     Comments:  Tough to have him focus today.       Data Reviewed: Basic Metabolic Panel: Recent Labs  Lab 10/06/20 0250 10/07/20 0906 10/08/20 0343 10/09/20 0655  NA 135 139 142 144  K 4.3 3.9 3.7 3.7  CL 94* 100 107 109  CO2 30 30 25 26   GLUCOSE 208* 103* 88 133*  BUN 40* 37* 35* 29*  CREATININE 1.14 0.96 0.92 0.91  CALCIUM 9.4 9.7 9.1 8.9   Liver Function Tests: Recent Labs  Lab 10/06/20 0250  AST 23  ALT 13  ALKPHOS 65  BILITOT 0.9  PROT 7.3  ALBUMIN 3.3*   Recent Labs  Lab 10/06/20 0250  LIPASE 25   CBC: Recent Labs  Lab 10/06/20 0316 10/07/20 0906 10/08/20 0343 10/09/20 0655  WBC 28.3* 18.8* 11.4* 6.7  NEUTROABS 24.7*  --   --   --   HGB 11.8* 10.9* 9.8* 10.2*  HCT 37.2* 34.0* 32.1* 33.2*  MCV 81.8 83.3 84.9 84.5  PLT 273 256 232 237    CBG: Recent Labs  Lab 10/08/20 1957 10/08/20 2349 10/09/20 0523 10/09/20 0734 10/09/20 1119  GLUCAP 115* 119* 130* 112* 144*    Recent Results (  from the past 240 hour(s))  Blood culture (routine x 2)     Status: None (Preliminary result)   Collection Time: 10/06/20  7:55 AM   Specimen: BLOOD  Result Value Ref Range Status   Specimen Description BLOOD BLOOD RIGHT HAND  Final   Special Requests   Final    BOTTLES DRAWN AEROBIC AND ANAEROBIC Blood Culture adequate volume   Culture   Final    NO GROWTH 3 DAYS Performed at [facility], [address] [place], [place] [address]    Report Status PENDING  Incomplete  Blood culture (routine x 2)     Status: None (Preliminary result)   Collection Time: 10/06/20  8:02 AM   Specimen: BLOOD  Result Value Ref Range Status   Specimen Description BLOOD BLOOD RIGHT ARM  Final   Special Requests   Final    BOTTLES DRAWN AEROBIC AND ANAEROBIC Blood Culture adequate volume   Culture   Final    NO GROWTH 3 DAYS Performed at [facility], [address] [place], [place] [address]    Report Status PENDING  Incomplete  Urine Culture     Status: None    Collection Time: 10/06/20 10:43 AM   Specimen: Urine, Random  Result Value Ref Range Status   Specimen Description   Final    URINE, RANDOM Performed at [facility], [address] [place], [place] [address]    Special Requests   Final    NONE Performed at [facility], [address] [place], [place] [address]    Culture   Final    NO GROWTH Performed at [facility], [place] [address], [place], [place] [address]    Report Status 10/08/2020 FINAL  Final  Resp Panel by RT-PCR (Flu A&B, Covid) Nasopharyngeal Swab     Status: None   Collection Time: 10/06/20  4:27 PM   Specimen: Nasopharyngeal Swab; Nasopharyngeal(NP) swabs in vial transport medium  Result Value Ref Range Status   SARS Coronavirus 2 by RT PCR NEGATIVE NEGATIVE Final    Comment: (NOTE) SARS-CoV-2 target nucleic acids are NOT DETECTED.  The SARS-CoV-2 RNA is generally detectable in upper respiratory specimens during the acute phase of infection. The lowest concentration of SARS-CoV-2 viral copies this assay can detect is 138 copies/mL. A negative result does not preclude SARS-Cov-2 infection and should not be used as the sole basis for treatment or other patient management decisions. A negative result may occur with  improper specimen collection/handling, submission of specimen other than nasopharyngeal swab, presence of viral mutation(s) within the areas targeted by this assay, and inadequate number of viral copies(<138 copies/mL). A negative result must be combined with clinical observations, patient history, and epidemiological information. The expected result is Negative.  Fact Sheet for Patients:  EntrepreneurPulse.com.au  Fact Sheet for Healthcare Providers:  IncredibleEmployment.be  This test is no t yet approved or cleared by the [place] FDA and  has been authorized for detection and/or diagnosis of SARS-CoV-2 by FDA under an Emergency  Use Authorization (EUA). This EUA will remain  in effect (meaning this test can be used) for the duration of the COVID-19 declaration under Section 564(b)(1) of the Act, 21 U.S.C.section 360bbb-3(b)(1), unless the authorization is terminated  or revoked sooner.       Influenza A by PCR NEGATIVE NEGATIVE Final   Influenza B by PCR NEGATIVE NEGATIVE Final    Comment: (NOTE) The Xpert Xpress SARS-CoV-2/FLU/RSV plus assay is intended as  an aid in the diagnosis of influenza from Nasopharyngeal swab specimens and should not be used as a sole basis for treatment. Nasal washings and aspirates are unacceptable for Xpert Xpress SARS-CoV-2/FLU/RSV testing.  Fact Sheet for Patients: EntrepreneurPulse.com.au  Fact Sheet for Healthcare Providers: IncredibleEmployment.be  This test is not yet approved or cleared by the [place] FDA and has been authorized for detection and/or diagnosis of SARS-CoV-2 by FDA under an Emergency Use Authorization (EUA). This EUA will remain in effect (meaning this test can be used) for the duration of the COVID-19 declaration under Section 564(b)(1) of the Act, 21 U.S.C. section 360bbb-3(b)(1), unless the authorization is terminated or revoked.  Performed at [facility], [address] [place], [place] [address]      Studies: DG Abd 2 Views  Result Date: 10/08/2020 CLINICAL DATA:  Small bowel obstruction. EXAM: ABDOMEN - 2 VIEW COMPARISON:  October 07, 2020.  October 06, 2020. FINDINGS: Continued presence of gastric pneumatosis. No abnormal bowel dilatation is noted. Status post cholecystectomy. Surgical clips are seen in the right side of the abdomen and right lower quadrant. IMPRESSION: Continued presence of gastric pneumatosis. No abnormal bowel dilatation is noted. Electronically Signed   By: Marijo Conception M.D.   On: 10/08/2020 08:28    Scheduled Meds: . divalproex  125 mg Oral BID  . enoxaparin  (LOVENOX) injection  40 mg Subcutaneous Q24H  . insulin aspart  0-15 Units Subcutaneous Q4H  . metoprolol succinate  25 mg Oral Daily  . mycophenolate  500 mg Oral BID  . pantoprazole (PROTONIX) IV  40 mg Intravenous Q24H  . predniSONE  10 mg Oral Daily  . QUEtiapine  100 mg Oral QHS  . QUEtiapine  25 mg Oral Daily  . tacrolimus  5 mg Oral Daily  . tacrolimus  6 mg Oral QHS  . traZODone  100 mg Oral QHS   Continuous Infusions: . sodium chloride Stopped (10/06/20 1011)  . ampicillin-sulbactam (UNASYN) IV 3 g (10/09/20 0829)    Assessment/Plan:  1. Acute metabolic encephalopathy and acute delirium secondary to not sleeping last night.  Patient has a history of vascular dementia.  Continue Seroquel at night and will add Seroquel 25 mg during the day.  Restart Depakote low dose twice daily.  Continue trazodone at night.  As needed IV Haldol.  The patient's wife is in the room with him and he is calmer potentially going to take a nap. 2. Small bowel obstruction with dilated stomach and pneumatosis.  General surgery advancing to clear liquid diet.  Patient had a bowel movement as per wife.  Empirically on Unasyn 3. Aspiration pneumonia leukocytosis on empiric Unasyn.  White blood cell count was elevated at 28.3 on presentation and now normalized at 6.7. 4. History of kidney transplant.  Continue immunosuppressive medications including prednisone, tacrolimus and CellCept. 5. Essential hypertension on low-dose Toprol 6. Lactic acidosis on IV fluids 7. Weakness.  Appreciate physical therapy evaluation [facility]-term patient did [facility]. 8. History of subdural hematoma    Code Status:     Code Status Orders  (From admission, onward)         Start     Ordered   10/06/20 0929  Full code  Continuous        10/06/20 0930        Code Status History    This patient has a current code status but no historical code status.   Advance Care Planning Activity     Family  Communication: Spoke with  patient's wife on the phone Disposition Plan: Status is: Inpatient  Dispo: The [facility] is from: [facility]              Anticipated d/c is to: [facility]              Anticipated d/c date is: Potentially 10/11/2020              Patient currently being treated for small bowel obstruction and started on clear liquid diet today.  Consultants:  General surgery  Antibiotics:  Unasyn  Time spent: 27 minutes  Theodore

## 2020-10-10 LAB — GLUCOSE, CAPILLARY
Glucose-Capillary: 92 mg/dL (ref 70–99)
Glucose-Capillary: 108 mg/dL — ABNORMAL HIGH (ref 70–99)
Glucose-Capillary: 154 mg/dL — ABNORMAL HIGH (ref 70–99)
Glucose-Capillary: 177 mg/dL — ABNORMAL HIGH (ref 70–99)
Glucose-Capillary: 114 mg/dL — ABNORMAL HIGH (ref 70–99)
Glucose-Capillary: 162 mg/dL — ABNORMAL HIGH (ref 70–99)

## 2020-10-10 NOTE — Plan of Care (Signed)
Continue with plan of care.  

## 2020-10-10 NOTE — Progress Notes (Signed)
Patient ID: James Hicks, male   DOB: Apr 18, 1946, 74 y.o.   MRN: [id number] [facility] PROGRESS NOTE  KENDARRIUS TANZI CZY:[id number] DOB: [birth year] DOA: 10/06/2020 PCP: Derinda Late, MD  HPI/Subjective: Patient this morning with sleeping. I did try to ask him a few questions he answers only few questions and went right back to sleep. In speaking with the nursing staff he did wake up and ate a little bit of lunch. We'll advance his diet and see how things go. Had a few bowel movements yesterday.  Objective: Vitals:   10/10/20 0757 10/10/20 1240  BP: (!) 155/70 (!) 148/89  Pulse: (!) 58 60  Resp: 16 18  Temp: (!) 97.5 F (36.4 C) 97.6 F (36.4 C)  SpO2: 100% 100%    Intake/Output Summary (Last 24 hours) at 10/10/2020 1530 Last data filed at 10/10/2020 1408 Gross per 24 hour  Intake 2796.97 ml  Output 1000 ml  Net 1796.97 ml   Filed Weights   10/06/20 0243 10/06/20 2131  Weight: 72.6 kg 68.4 kg    ROS: Review of Systems  Cardiovascular: Negative for chest pain.  Gastrointestinal: Negative for abdominal pain.   Exam: Physical Exam HENT:     Head: Normocephalic.     Mouth/Throat:     Comments: Unable to look in the mouth today Eyes:     General: Lids are normal.     Conjunctiva/sclera: Conjunctivae normal.  Cardiovascular:     Rate and Rhythm: Normal rate and regular rhythm.     Heart sounds: Normal heart sounds, S1 normal and S2 normal.  Pulmonary:     Breath sounds: No decreased breath sounds, wheezing, rhonchi or rales.  Abdominal:     Palpations: Abdomen is soft.     Tenderness: There is no abdominal tenderness.  Musculoskeletal:     Right knee: No swelling.     Left knee: No swelling.  Skin:    General: Skin is warm.     Comments: Skin tear right shin.  Neurological:     Mental Status: He is lethargic.     Comments: Answered a few questions but went right back to sleep.       Data Reviewed: Basic Metabolic Panel: Recent Labs  Lab  10/06/20 0250 10/07/20 0906 10/08/20 0343 10/09/20 0655  NA 135 139 142 144  K 4.3 3.9 3.7 3.7  CL 94* 100 107 109  CO2 30 30 25 26   GLUCOSE 208* 103* 88 133*  BUN 40* 37* 35* 29*  CREATININE 1.14 0.96 0.92 0.91  CALCIUM 9.4 9.7 9.1 8.9   Liver Function Tests: Recent Labs  Lab 10/06/20 0250  AST 23  ALT 13  ALKPHOS 65  BILITOT 0.9  PROT 7.3  ALBUMIN 3.3*   Recent Labs  Lab 10/06/20 0250  LIPASE 25   CBC: Recent Labs  Lab 10/06/20 0316 10/07/20 0906 10/08/20 0343 10/09/20 0655  WBC 28.3* 18.8* 11.4* 6.7  NEUTROABS 24.7*  --   --   --   HGB 11.8* 10.9* 9.8* 10.2*  HCT 37.2* 34.0* 32.1* 33.2*  MCV 81.8 83.3 84.9 84.5  PLT 273 256 232 237    CBG: Recent Labs  Lab 10/09/20 1739 10/09/20 2340 10/10/20 0528 10/10/20 0758 10/10/20 1241  GLUCAP 192* 160* 92 108* 154*    Recent Results (from the past 240 hour(s))  Blood culture (routine x 2)     Status: None (Preliminary result)   Collection Time: 10/06/20  7:55 AM   Specimen: BLOOD  Result Value Ref Range Status   Specimen Description BLOOD BLOOD RIGHT HAND  Final   Special Requests   Final    BOTTLES DRAWN AEROBIC AND ANAEROBIC Blood Culture adequate volume   Culture   Final    NO GROWTH 4 DAYS Performed at [facility], [address], [place], [place] [address]    Report Status PENDING  Incomplete  Blood culture (routine x 2)     Status: None (Preliminary result)   Collection Time: 10/06/20  8:02 AM   Specimen: BLOOD  Result Value Ref Range Status   Specimen Description BLOOD BLOOD RIGHT ARM  Final   Special Requests   Final    BOTTLES DRAWN AEROBIC AND ANAEROBIC Blood Culture adequate volume   Culture   Final    NO GROWTH 4 DAYS Performed at [facility], [address], [place], [place] [address]    Report Status PENDING  Incomplete  Urine Culture     Status: None   Collection Time: 10/06/20 10:43 AM   Specimen: Urine, Random  Result Value Ref Range Status   Specimen  Description   Final    URINE, RANDOM Performed at [facility], [address], [place], [place] [address]    Special Requests   Final    NONE Performed at [facility], [address], [place], [place] [address]    Culture   Final    NO GROWTH Performed at [facility], [place] [address] [place],  [address]    Report Status 10/08/2020 FINAL  Final  Resp Panel by RT-PCR (Flu A&B, Covid) Nasopharyngeal Swab     Status: None   Collection Time: 10/06/20  4:27 PM   Specimen: Nasopharyngeal Swab; Nasopharyngeal(NP) swabs in vial transport medium  Result Value Ref Range Status   SARS Coronavirus 2 by RT PCR NEGATIVE NEGATIVE Final    Comment: (NOTE) SARS-CoV-2 target nucleic acids are NOT DETECTED.  The SARS-CoV-2 RNA is generally detectable in upper respiratory specimens during the acute phase of infection. The lowest concentration of SARS-CoV-2 viral copies this assay can detect is 138 copies/mL. A negative result does not preclude SARS-Cov-2 infection and should not be used as the sole basis for treatment or other patient management decisions. A negative result may occur with  improper specimen collection/handling, submission of specimen other than nasopharyngeal swab, presence of viral mutation(s) within the areas targeted by this assay, and inadequate number of viral copies(<138 copies/mL). A negative result must be combined with clinical observations, patient history, and epidemiological information. The expected result is Negative.  Fact Sheet for Patients:  EntrepreneurPulse.com.au  Fact Sheet for Healthcare Providers:  IncredibleEmployment.be  This test is no t yet approved or cleared by the [place] FDA and  has been authorized for detection and/or diagnosis of SARS-CoV-2 by FDA under an Emergency Use Authorization (EUA). This EUA will remain  in effect (meaning this test can be used) for the  duration of the COVID-19 declaration under Section 564(b)(1) of the Act, 21 U.S.C.section 360bbb-3(b)(1), unless the authorization is terminated  or revoked sooner.       Influenza A by PCR NEGATIVE NEGATIVE Final   Influenza B by PCR NEGATIVE NEGATIVE Final    Comment: (NOTE) The Xpert Xpress SARS-CoV-2/FLU/RSV plus assay is intended as an aid in the diagnosis of influenza from Nasopharyngeal swab specimens and should not be used as a sole basis for treatment. Nasal washings and aspirates are unacceptable for Xpert Xpress SARS-CoV-2/FLU/RSV  testing.  Fact Sheet for Patients: EntrepreneurPulse.com.au  Fact Sheet for Healthcare Providers: IncredibleEmployment.be  This test is not yet approved or cleared by the [place] FDA and has been authorized for detection and/or diagnosis of SARS-CoV-2 by FDA under an Emergency Use Authorization (EUA). This EUA will remain in effect (meaning this test can be used) for the duration of the COVID-19 declaration under Section 564(b)(1) of the Act, 21 U.S.C. section 360bbb-3(b)(1), unless the authorization is terminated or revoked.  Performed at [facility], [address] [place], [place] [address]       Scheduled Meds: . divalproex  125 mg Oral BID  . enoxaparin (LOVENOX) injection  40 mg Subcutaneous Q24H  . insulin aspart  0-15 Units Subcutaneous Q4H  . metoprolol succinate  25 mg Oral Daily  . mycophenolate  500 mg Oral BID  . pantoprazole (PROTONIX) IV  40 mg Intravenous Q24H  . predniSONE  10 mg Oral Daily  . QUEtiapine  100 mg Oral QHS  . tacrolimus  5 mg Oral Daily  . tacrolimus  6 mg Oral QHS  . traZODone  100 mg Oral QHS   Continuous Infusions: . sodium chloride 100 mL/hr at 10/10/20 1340  . ampicillin-sulbactam (UNASYN) IV Stopped (10/10/20 1313)    Assessment/Plan:  1. Acute metabolic encephalopathy and acute delirium secondary to not sleeping 2 nights ago. Patient  very sleepy this morning after getting him his medication at night. We'll get rid of the Seroquel during the day. Continue low-dose Depakote once able to take. As per nursing staff he did wake up to eat something today. Will advance diet tonight. 2. Small bowel obstruction with dilated stomach and pneumatosis. General surgery is okay with advancing diet as tolerated. Seen by GI and they did not want to do a endoscopy at this point in time. Patient did have some bowel movements. We'll see how he does with advance diet today and potential disposition tomorrow if tolerates advance diet. 3. Aspiration pneumonia and leukocytosis. On empiric Unasyn. White blood cell count now has normalized. 4. History of kidney transplant. Continue immunosuppressive medications including prednisone, tacrolimus and CellCept. 5. Essential hypertension on low-dose Toprol 6. Lactic acidosis given IV fluids 7. History of subdural hematoma     Code Status:     Code Status Orders  (From admission, onward)         Start     Ordered   10/06/20 0929  Full code  Continuous        10/06/20 0930        Code Status History    This patient has a current code status but no historical code status.   Advance Care Planning Activity     Family Communication: Spoke with the patient's wife on the phone Disposition Plan: Status is: Inpatient  Dispo: The [facility] is from: [facility]              Anticipated d/c is to: [facility]              Anticipated d/c date is: Potentially 10/11/2020 if tolerates solid food              Patient currently slowly improving. Patient had acute metabolic encephalopathy this morning secondary to given medications last night to sleep. He did not sleep the night prior.  Consultants:  General surgery  Antibiotics:  Unasyn  Time spent: 27 minutes  [place]

## 2020-10-10 NOTE — Progress Notes (Signed)
[facility] SUR[facility]ICAL PROGRESS NOTE (cpt [phone])  Hospital Day(s): 4.   Interval History:  Patient seen and examined no acute events or new complaints overnight.  Unreliable historian secondary to his dementia.  Patient somnolent, not easily arousable, appears comfortable.  No new labs or imaging this morning.  He was started on CLD yesterday and tolerated this well. He does have a BM recorded in the chart.   Review of Systems:  Unable to reliably preform secondary to dementia   Vital signs in last 24 hours: [min-max] current  Temp:  [97.9 F (36.6 C)-98.2 F (36.8 C)] 98.2 F (36.8 C) (12/13 0539) Pulse Rate:  [62-65] 62 (12/13 0539) Resp:  [16-20] 16 (12/13 0539) BP: (100-179)/(51-98) 151/51 (12/13 0539) SpO2:  [97 %-100 %] 100 % (12/13 0539)     Height: 6' (182.9 cm) Weight: 68.4 kg BMI (Calculated): 20.45   Intake/Output last 2 shifts:  12/12 0701 - 12/13 0700 In: 540 [P.O.:540] Out: 1000 [Urine:1000]   Physical Exam:  Constitutional: somnolent, does not arouse to verbal or painful stimuli, appears comfortable HENT: normocephalic without obvious abnormality  Respiratory: breathing non-labored at rest  Cardiovascular: regular rate and sinus rhythm  Gastrointestinal: Soft, he does not appear to have any pain on examination but again this is limited secondary to mental status, non-distended, no rebound/guarding, I do not appreciate any sign of peritonitis  Musculoskeletal: muscle wasting, no edema    Labs:  CBC Latest Ref Rng & Units 10/09/2020 10/08/2020 10/07/2020  WBC 4.0 - 10.5 K/uL 6.7 11.4(H) 18.8(H)  Hemoglobin 13.0 - 17.0 g/dL 10.2(L) 9.8(L) 10.9(L)  Hematocrit 39.0 - 52.0 % 33.2(L) 32.1(L) 34.0(L)  Platelets 150 - 400 K/uL 237 232 256   CMP Latest Ref Rng & Units 10/09/2020 10/08/2020 10/07/2020  Glucose 70 - 99 mg/dL 133(H) 88 103(H)  BUN 8 - 23 mg/dL 29(H) 35(H) 37(H)  Creatinine 0.61 - 1.24 mg/dL 0.91 0.92 0.96  Sodium 135 - 145 mmol/L  144 142 139  Potassium 3.5 - 5.1 mmol/L 3.7 3.7 3.9  Chloride 98 - 111 mmol/L 109 107 100  CO2 22 - 32 mmol/L 26 25 30   Calcium 8.9 - 10.3 mg/dL 8.9 9.1 9.7  Total Protein 6.5 - 8.1 g/dL - - -  Total Bilirubin 0.3 - 1.2 mg/dL - - -  Alkaline Phos 38 - 126 U/L - - -  AST 15 - 41 U/L - - -  ALT 0 - 44 U/L - - -     Imaging studies: No new pertinent imaging studies   Assessment/Plan: (ICD-10's: K37.609) 74 y.o. male found to have gastric distension and dependent pneumatosis of unclear etiology, likely benign, as his work up and examination are reassuring, complicated by pertinent comorbidities includingdementia, chronic renal failure, history of CVA, diabetes, hyperlipidemia, hypertension.   - Okay to advance diet as tolerated   - No need for emergent intervention. We will monitor him closely - Recommend serial KUBs as needed moving forward; can hold off for now as doing well clinically   - Can consider repeat CT today vs tomorrow pending clinical condition. If he is doing well, we can likely forgo this   - Monitor abdominal examination; on-going bowel function - Further management per primary service   All of the above findings and recommendations were discussed with the medical team. No family present.    -- James Simon, PA-C [facility] 10/10/2020, 7:16 AM [phone] M-F: 7am - 4pm

## 2020-10-10 NOTE — Care Management Important Message (Signed)
Important Message  Patient Details  Name: James Hicks MRN: [id number] Date of Birth: Jul 04, 1946   Medicare Important Message Given:  Yes     Dannette Barbara 10/10/2020, 11:43 AM

## 2020-10-11 LAB — CULTURE, BLOOD (ROUTINE X 2)
Culture: NO GROWTH
Culture: NO GROWTH
Special Requests: ADEQUATE
Special Requests: ADEQUATE

## 2020-10-11 LAB — RESP PANEL BY RT-PCR (FLU A&B, COVID) ARPGX2
Influenza A by PCR: NEGATIVE
Influenza B by PCR: NEGATIVE
SARS Coronavirus 2 by RT PCR: NEGATIVE

## 2020-10-11 LAB — GLUCOSE, CAPILLARY
Glucose-Capillary: 144 mg/dL — ABNORMAL HIGH (ref 70–99)
Glucose-Capillary: 114 mg/dL — ABNORMAL HIGH (ref 70–99)

## 2020-10-11 LAB — TACROLIMUS LEVEL: Tacrolimus (FK506) - LabCorp: 5.2 ng/mL (ref 2.0–20.0)

## 2020-10-11 MED ORDER — INSULIN GLARGINE 100 UNIT/ML ~~LOC~~ SOLN: 7.0000 [IU] | Freq: Every day | SUBCUTANEOUS | 11 refills | Status: DC

## 2020-10-11 MED ORDER — PANTOPRAZOLE SODIUM 40 MG PO TBEC: 40.0000 mg | DELAYED_RELEASE_TABLET | Freq: Every day | ORAL | 0 refills | Status: DC

## 2020-10-11 MED ORDER — HALOPERIDOL LACTATE 5 MG/ML IJ SOLN
2.0000 mg | Freq: Once | INTRAMUSCULAR | Status: AC
  Administered 2020-10-11: 01:00:00 2 mg via INTRAVENOUS
  Filled 2020-10-11: qty 1

## 2020-10-11 MED ORDER — POLYETHYLENE GLYCOL 3350 17 G PO PACK: 17.0000 g | PACK | Freq: Every day | ORAL | 0 refills | Status: DC | PRN

## 2020-10-11 NOTE — Progress Notes (Signed)
[facility] SUR[facility]ICAL PROGRESS NOTE (cpt [phone])  Hospital Day(s): 5.   Interval History:  Patient seen and examined no acute events or new complaints overnight.  Unreliable historian secondary to his dementia.  Patient somnolent, not easily arousable, appears comfortable.  No new labs or imaging this morning.  Advanced to regular diet  Review of Systems:  Unable to reliably preform secondary to dementia   Vital signs in last 24 hours: [min-max] current  Temp:  [97.5 F (36.4 C)-98.6 F (37 C)] 98.4 F (36.9 C) (12/14 0331) Pulse Rate:  [58-67] 62 (12/14 0331) Resp:  [16-20] 20 (12/14 0331) BP: (146-178)/(70-89) 146/83 (12/14 0331) SpO2:  [100 %] 100 % (12/14 0331)     Height: 6' (182.9 cm) Weight: 68.4 kg BMI (Calculated): 20.45   Intake/Output last 2 shifts:  12/13 0701 - 12/14 0700 In: 2797 [P.O.:1200; I.V.:997; IV Piggyback:600] Out: 700 [Urine:700]   Physical Exam:  Constitutional: somnolent, does not arouse to verbal or painful stimuli, appears comfortable HENT: normocephalic without obvious abnormality  Respiratory: breathing non-labored at rest  Cardiovascular: regular rate and sinus rhythm  Gastrointestinal: Soft, he does not appear to have any pain on examination but again this is limited secondary to mental status, non-distended, no rebound/guarding, I do not appreciate any sign of peritonitis  Musculoskeletal: muscle wasting, no edema    Labs:  CBC Latest Ref Rng & Units 10/09/2020 10/08/2020 10/07/2020  WBC 4.0 - 10.5 K/uL 6.7 11.4(H) 18.8(H)  Hemoglobin 13.0 - 17.0 g/dL 10.2(L) 9.8(L) 10.9(L)  Hematocrit 39.0 - 52.0 % 33.2(L) 32.1(L) 34.0(L)  Platelets 150 - 400 K/uL 237 232 256   CMP Latest Ref Rng & Units 10/09/2020 10/08/2020 10/07/2020  Glucose 70 - 99 mg/dL 133(H) 88 103(H)  BUN 8 - 23 mg/dL 29(H) 35(H) 37(H)  Creatinine 0.61 - 1.24 mg/dL 0.91 0.92 0.96  Sodium 135 - 145 mmol/L 144 142 139  Potassium 3.5 - 5.1 mmol/L 3.7 3.7 3.9   Chloride 98 - 111 mmol/L 109 107 100  CO2 22 - 32 mmol/L 26 25 30   Calcium 8.9 - 10.3 mg/dL 8.9 9.1 9.7  Total Protein 6.5 - 8.1 g/dL - - -  Total Bilirubin 0.3 - 1.2 mg/dL - - -  Alkaline Phos 38 - 126 U/L - - -  AST 15 - 41 U/L - - -  ALT 0 - 44 U/L - - -     Imaging studies: No new pertinent imaging studies   Assessment/Plan: (ICD-10's: K30.609) 74 y.o. male found to have gastric distension and dependent pneumatosis of unclear etiology, likely benign, as his work up and examination are reassuring, complicated by pertinent comorbidities includingdementia, chronic renal failure, history of CVA, diabetes, hyperlipidemia, hypertension.   - Okay to continue diet as tolerated  - No need for emergent intervention. We will monitor him closely - Additional imaging of no benefit at this time  - Monitor abdominal examination; on-going bowel function - Further management per primary service    - General surgery will sign-off, discharge once medically cleared.   All of the above findings and recommendations were discussed with the medical team. No family present.    -- Edison Simon, PA-C [facility] 10/11/2020, 7:12 AM [phone] M-F: 7am - 4pm

## 2020-10-11 NOTE — Discharge Instructions (Signed)
Bowel Obstruction °A bowel obstruction means that something is blocking the small or large bowel. The bowel is also called the intestine. It is the long tube that connects the stomach to the opening of the butt (anus). When something blocks the bowel, food and fluids cannot pass through like normal. This condition needs to be treated. Treatment depends on the cause of the problem and how bad the problem is. °What are the causes? °Common causes of this condition include: °· Scar tissue (adhesions) from past surgery or from high-energy X-rays (radiation). °· Recent surgery in the belly. This affects how food moves in the bowel. °· Some diseases, such as: °? Irritation of the lining of the digestive tract (Crohn's disease). °? Irritation of small pouches in the bowel (diverticulitis). °· Growths or tumors. °· A bulging organ (hernia). °· Twisting of the bowel (volvulus). °· A foreign body. °· Slipping of a part of the bowel into another part (intussusception). °What are the signs or symptoms? °Symptoms of this condition include: °· Pain in the belly. °· Feeling sick to your stomach (nauseous). °· Throwing up (vomiting). °· Bloating in the belly. °· Being unable to pass gas. °· Trouble pooping (constipation). °· Watery poop (diarrhea). °· A lot of belching. °How is this diagnosed? °This condition may be diagnosed based on: °· A physical exam. °· Medical history. °· Imaging tests, such as X-ray or CT scan. °· Blood tests. °· Urine tests. °How is this treated? °Treatment for this condition may include: °· Fluids and pain medicines that are given through an IV tube. Your doctor may tell you not to eat or drink if you feel sick to your stomach and are throwing up. °· Eating a clear liquid diet for a few days. °· Putting a small tube (nasogastric tube) into the stomach. This will help with pain, discomfort, and nausea by removing blocked air and fluids from the stomach. °· Surgery. This may be needed if other treatments do  not work. °Follow these instructions at home: °Medicines °· Take over-the-counter and prescription medicines only as told by your doctor. °· If you were prescribed an antibiotic medicine, take it as told by your doctor. Do not stop taking the antibiotic even if you start to feel better. °General instructions °· Follow your diet as told by your doctor. You may need to: °? Only drink clear liquids until you start to get better. °? Avoid solid foods. °· Return to your normal activities as told by your doctor. Ask your doctor what activities are safe for you. °· Do not sit for a long time without moving. Get up to take short walks every 1-2 hours. This is important. Ask for help if you feel weak or unsteady. °· Keep all follow-up visits as told by your doctor. This is important. °How is this prevented? °After having a bowel obstruction, you may be more likely to have another. You can do some things to stop it from happening again. °· If you have a long-term (chronic) disease, contact your doctor if you see changes or problems. °· Take steps to prevent or treat trouble pooping. Your doctor may ask that you: °? Drink enough fluid to keep your pee (urine) pale yellow. °? Take over-the-counter or prescription medicines. °? Eat foods that are high in fiber. These include beans, whole grains, and fresh fruits and vegetables. °? Limit foods that are high in fat and sugar. These include fried or sweet foods. °· Stay active. Ask your doctor which exercises are   safe for you. °· Avoid stress. °· Eat three small meals and three small snacks each day. °· Work with a food expert (dietitian) to make a meal plan that works for you. °· Do not use any products that contain nicotine or tobacco, such as cigarettes and e-cigarettes. If you need help quitting, ask your doctor. °Contact a doctor if: °· You have a fever. °· You have chills. °Get help right away if: °· You have pain or cramps that get worse. °· You throw up blood. °· You are  sick to your stomach. °· You cannot stop throwing up. °· You cannot drink fluids. °· You feel mixed up (confused). °· You feel very thirsty (dehydrated). °· Your belly gets more bloated. °· You feel weak or you pass out (faint). °Summary °· A bowel obstruction means that something is blocking the small or large bowel. °· Treatment may include IV fluids and pain medicine. You may also have a clear liquid diet, a small tube in your stomach, or surgery. °· Drink clear liquids and avoid solid foods until you get better. °This information is not intended to replace advice given to you by your health care provider. Make sure you discuss any questions you have with your health care provider. °Document Revised: 02/26/2018 Document Reviewed: 02/26/2018 °Elsevier Patient Education © 2020 [employer] Inc. ° °

## 2020-10-11 NOTE — Progress Notes (Signed)
Attempted to call report x2 to [facility]. 1st attempt RN left on hold, 2nd attempt answering service states please press 0 or remain on line, no answer, answering service picks up again with same message.

## 2020-10-11 NOTE — TOC Transition Note (Signed)
Transition of Care [facility]) - CM/SW Discharge Note   Patient Details  Name: James Hicks MRN: [id number] Date of Birth: [birth year]  Transition of Care [facility]) CM/SW Contact:  Candie Chroman, LCSW Phone Number: 10/11/2020, 11:30 AM   Clinical Narrative: Patient has orders to discharge to [facility] today. RN attempted calling x 2 but no answer. EMS transport set up. He is first on the list. No further concerns. CSW signing off.    Final next level of care: Skilled Nursing Facility Barriers to Discharge: Barriers Resolved   Patient Goals and CMS Choice Patient states their goals for this hospitalization and ongoing recovery are:: Patient to discharge back to [facility] when medically ready CMS Medicare.gov Compare Post Acute Care list provided to:: Patient Represenative (must comment) Choice offered to / list presented to : NA  Discharge Placement   Existing PASRR number confirmed : 10/11/20          Patient chooses bed at: [facility] [place] Patient to be transferred to facility by: EMS Name of family member notified: Yeriel Mineo Patient and family notified of of transfer: 10/11/20  Discharge Plan and Services   Discharge Planning Services: CM Consult Post Acute Care Choice: [place]          DME Arranged: N/A DME Agency: NA       HH Arranged: NA          Social Determinants of Health (SDOH) Interventions     Readmission Risk Interventions Readmission Risk Prevention Plan 10/11/2020  Transportation Screening Complete  PCP or Specialist Appt within 3-5 Days Complete  Social Work Consult for [place] Planning/Counseling [place] Not Applicable  Medication Review Press photographer) Complete  Some recent data might be hidden

## 2020-10-11 NOTE — Discharge Summary (Signed)
[facility] NAME: James Hicks    MR#:  [id number]  DATE OF BIRTH:  03-08-1946  DATE OF ADMISSION:  10/06/2020 ADMITTING PHYSICIAN: Athena Masse, MD  DATE OF DISCHARGE: 10/11/2020  PRIMARY CARE PHYSICIAN: Derinda Late, MD    ADMISSION DIAGNOSIS:  Small bowel obstruction ([place]) [K56.609] SBO (small bowel obstruction) ([place]) [K56.609] Aspiration pneumonia of left lung due to gastric secretions, unspecified part of lung ([place]) [J69.0]  DISCHARGE DIAGNOSIS:  Principal Problem:   SBO (small bowel obstruction) (HCC) Active Problems:   Type 2 diabetes mellitus without complication, with long-term current use of insulin (HCC)   Essential hypertension   History of renal transplant   Aspiration pneumonia (HCC)   Pneumatosis of intestines   Lactic acidosis   Vascular dementia without behavioral disturbance (HCC)   Acute metabolic encephalopathy   Acute delirium   SECONDARY DIAGNOSIS:   Past Medical History:  Diagnosis Date  . Cholelithiasis   . Chronic renal failure    kidney transplant   . Colon polyps   . CVA (cerebral infarction)   . Degenerative joint disease   . Diabetes mellitus without complication ([place])   . Diabetic neuropathy ([place])   . Hydronephrosis of right kidney   . Hyperlipidemia   . Hypertension   . Renal cell carcinoma ([place])   . Stroke [facility])     HOSPITAL COURSE:   1.  Small bowel obstruction with dilated stomach and pneumatosis.  The patient did not have any nausea or vomiting and did not require an NG tube and gradually improved on his own.  He did have a few bowel movements the other day.  General surgery was okay with advancing diet and continuing to monitor.  Patient was seen by gastroenterology and they did not want to do an endoscopy with pneumatosis with high risk of perforation.  We will keep on Protonix at this time.  MiraLAX as needed for constipation. 2.  Acute metabolic encephalopathy with acute  delirium secondary to not sleeping 3 nights ago.  This has improved.  Patient awake this morning and answering questions.  Continue low-dose Depakote.  Continue Seroquel at night and trazodone at night. 3.  Aspiration pneumonia and leukocytosis.  The patient was given empiric Unasyn while here in the hospital and completed course.  White blood cell count now has normalized. 4.  History of kidney transplant.  I did restart the patient's immunosuppressive medications even though he was n.p.o. during the hospital course. 5.  Essential hypertension restart low-dose Toprol. 6.  Lactic acidosis on presentation given IV fluids 7.  Type 2 diabetes mellitus with hemoglobin A1c of 7.4.  Will restart low-dose Lantus insulin and the evening and do sliding scale. 8.  History of subdural hematoma  DISCHARGE CONDITIONS:   Satisfactory  CONSULTS OBTAINED:  Treatment Team:  Ronny Bacon, MD  DRUG ALLERGIES:   Allergies  Allergen Reactions  . Hylan G-F 20 Anaphylaxis    Other reaction(s): Other (See Comments), Unknown Other reaction(s): Unknown Other reaction(s): Unknown Other reaction(s): Unknown   . Latex Hives  . Other Anaphylaxis and Other (See Comments)    IBAND IBAND IBAND IBAND     DISCHARGE MEDICATIONS:   Allergies as of 10/11/2020      Reactions   Hylan G-f 20 Anaphylaxis   Other reaction(s): Other (See Comments), Unknown Other reaction(s): Unknown Other reaction(s): Unknown Other reaction(s): Unknown   Latex Hives   Other Anaphylaxis, Other (See Comments)  IBAND IBAND IBAND IBAND      Medication List    STOP taking these medications   hydrochlorothiazide 25 MG tablet Commonly known as: HYDRODIURIL   insulin lispro 100 UNIT/ML injection Commonly known as: HUMALOG   NIFEdipine 30 MG 24 hr tablet Commonly known as: PROCARDIA-XL/NIFEDICAL-XL   sodium bicarbonate 650 MG tablet     TAKE these medications   allopurinol 100 MG tablet Commonly known as:  ZYLOPRIM Take 100 mg by mouth daily.   atorvastatin 10 MG tablet Commonly known as: LIPITOR Take 10 mg by mouth daily.   calcium-vitamin D 500-200 MG-UNIT tablet Commonly known as: OSCAL WITH D Take 1 tablet by mouth 2 (two) times daily.   Depakote Sprinkles 125 MG capsule Generic drug: divalproex Take 125 mg by mouth 2 (two) times daily.   diclofenac Sodium 1 % Gel Commonly known as: VOLTAREN Apply 2 g topically 4 (four) times daily. To right shoulder and lower back   Dulcolax 5 MG EC tablet Generic drug: bisacodyl Take 5 mg by mouth 2 (two) times daily as needed.   Flomax 0.4 MG Caps capsule Generic drug: tamsulosin Take 0.8 mg by mouth at bedtime.   insulin aspart 100 UNIT/ML injection Commonly known as: novoLOG Inject 1-7 Units into the skin 4 (four) times daily. Per Sliding Scale ( 150-200: 1u, 201-249: 2u, 250-299: 3u, 300-349: 4u, 350-399: 5u, If greater than 399: 7u.   insulin glargine 100 UNIT/ML injection Commonly known as: LANTUS Inject 0.07 mLs (7 Units total) into the skin at bedtime. What changed:   how much to take  when to take this   Melatonin Maximum Strength 5 MG Tabs Generic drug: melatonin Take 5 mg by mouth at bedtime.   metoprolol succinate 25 MG 24 hr tablet Commonly known as: TOPROL-XL Take 25 mg by mouth daily.   mycophenolate 500 MG tablet Commonly known as: CELLCEPT Take 500 mg by mouth 2 (two) times daily.   pantoprazole 40 MG tablet Commonly known as: Protonix Take 1 tablet (40 mg total) by mouth daily.   polyethylene glycol 17 g packet Commonly known as: MIRALAX / GLYCOLAX Take 17 g by mouth daily as needed for moderate constipation.   predniSONE 10 MG tablet Commonly known as: DELTASONE Take 1 tablet (10 mg total) by mouth daily.   Senna Plus 8.6-50 MG tablet Generic drug: senna-docusate Take 1 tablet by mouth daily.   SEROquel 50 MG tablet Generic drug: QUEtiapine Take 100 mg by mouth at bedtime. What changed:  Another medication with the same name was removed. Continue taking this medication, and follow the directions you see here.   tacrolimus 1 MG capsule Commonly known as: PROGRAF Take 5 mg by mouth daily. 0900   tacrolimus 1 MG capsule Commonly known as: PROGRAF Take 6 mg by mouth at bedtime. 2100   traZODone 50 MG tablet Commonly known as: DESYREL Take 100 mg by mouth at bedtime.   Tylenol 325 MG tablet Generic drug: acetaminophen Take 650 mg by mouth every 6 (six) hours as needed.        DISCHARGE INSTRUCTIONS:   Follow-up team at rehab 1 day Follow-up with his nephrologist as scheduled  If you experience worsening of your admission symptoms, develop shortness of breath, life threatening emergency, suicidal or homicidal thoughts you must seek medical attention immediately by calling 911 or calling your MD immediately  if symptoms less severe.  You Must read complete instructions/literature along with all the possible adverse reactions/side effects for all the  Medicines you take and that have been prescribed to you. Take any new Medicines after you have completely understood and accept all the possible adverse reactions/side effects.   Please note  You were cared for by a hospitalist during your hospital stay. If you have any questions about your discharge medications or the care you received while you were in the hospital after you are discharged, you can call the unit and asked to speak with the hospitalist on call if the hospitalist that took care of you is not available. Once you are discharged, your primary care physician will handle any further medical issues. Please note that NO REFILLS for any discharge medications will be authorized once you are discharged, as it is imperative that you return to your primary care physician (or establish a relationship with a primary care physician if you do not have one) for your aftercare needs so that they can reassess your need for  medications and monitor your lab values.    Today   CHIEF COMPLAINT:   Chief Complaint  Patient presents with  . Abdominal Pain    HISTORY OF PRESENT ILLNESS:  James Hicks  is a 74 y.o. male came in with abdominal pain   VITAL SIGNS:  Blood pressure (!) 146/83, pulse 62, temperature 98.4 F (36.9 C), resp. rate 20, height 6' (1.829 m), weight 68.4 kg, SpO2 100 %.  I/O:    Intake/Output Summary (Last 24 hours) at 10/11/2020 0847 Last data filed at 10/10/2020 2120 Gross per 24 hour  Intake 2796.97 ml  Output 700 ml  Net 2096.97 ml    PHYSICAL EXAMINATION:  GENERAL:  74 y.o.-year-old patient lying in the bed with no acute distress.  EYES: Pupils equal, round, reactive to light and accommodation. No scleral icterus.  HEENT: Head atraumatic, normocephalic.  LUNGS: Normal breath sounds bilaterally, no wheezing, rales,rhonchi or crepitation. No use of accessory muscles of respiration.  CARDIOVASCULAR: S1, S2 normal. No murmurs, rubs, or gallops.  ABDOMEN: Soft, non-tender, non-distended.  EXTREMITIES: No pedal edema.  NEUROLOGIC: Patient moves all of his extremities on his own.  PSYCHIATRIC: The patient is alert and oriented x 3.  SKIN: No obvious rash, lesion, or ulcer.   DATA REVIEW:   CBC Recent Labs  Lab 10/09/20 0655  WBC 6.7  HGB 10.2*  HCT 33.2*  PLT 237    Chemistries  Recent Labs  Lab 10/06/20 0250 10/07/20 0906 10/09/20 0655  NA 135   < > 144  K 4.3   < > 3.7  CL 94*   < > 109  CO2 30   < > 26  GLUCOSE 208*   < > 133*  BUN 40*   < > 29*  CREATININE 1.14   < > 0.91  CALCIUM 9.4   < > 8.9  AST 23  --   --   ALT 13  --   --   ALKPHOS 65  --   --   BILITOT 0.9  --   --    < > = values in this interval not displayed.     Microbiology Results  Results for orders placed or performed during the hospital encounter of 10/06/20  Blood culture (routine x 2)     Status: None   Collection Time: 10/06/20  7:55 AM   Specimen: BLOOD  Result Value  Ref Range Status   Specimen Description BLOOD BLOOD RIGHT HAND  Final   Special Requests   Final    BOTTLES DRAWN  AEROBIC AND ANAEROBIC Blood Culture adequate volume   Culture   Final    NO GROWTH 5 DAYS Performed at [facility], [address] [place], [place] [address]    Report Status 10/11/2020 FINAL  Final  Blood culture (routine x 2)     Status: None   Collection Time: 10/06/20  8:02 AM   Specimen: BLOOD  Result Value Ref Range Status   Specimen Description BLOOD BLOOD RIGHT ARM  Final   Special Requests   Final    BOTTLES DRAWN AEROBIC AND ANAEROBIC Blood Culture adequate volume   Culture   Final    NO GROWTH 5 DAYS Performed at [facility], [address], [place], [place] [address]    Report Status 10/11/2020 FINAL  Final  Urine Culture     Status: None   Collection Time: 10/06/20 10:43 AM   Specimen: Urine, Random  Result Value Ref Range Status   Specimen Description   Final    URINE, RANDOM Performed at [facility], [address], [place], [place] [address]    Special Requests   Final    NONE Performed at [facility], [address], [place], [place] [address]    Culture   Final    NO GROWTH Performed at [facility], [place] [address] [place],  [address]    Report Status 10/08/2020 FINAL  Final  Resp Panel by RT-PCR (Flu A&B, Covid) Nasopharyngeal Swab     Status: None   Collection Time: 10/06/20  4:27 PM   Specimen: Nasopharyngeal Swab; Nasopharyngeal(NP) swabs in vial transport medium  Result Value Ref Range Status   SARS Coronavirus 2 by RT PCR NEGATIVE NEGATIVE Final    Comment: (NOTE) SARS-CoV-2 target nucleic acids are NOT DETECTED.  The SARS-CoV-2 RNA is generally detectable in upper respiratory specimens during the acute phase of infection. The lowest concentration of SARS-CoV-2 viral copies this assay can detect is 138 copies/mL. A negative result does not preclude SARS-Cov-2 infection and  should not be used as the sole basis for treatment or other patient management decisions. A negative result may occur with  improper specimen collection/handling, submission of specimen other than nasopharyngeal swab, presence of viral mutation(s) within the areas targeted by this assay, and inadequate number of viral copies(<138 copies/mL). A negative result must be combined with clinical observations, patient history, and epidemiological information. The expected result is Negative.  Fact Sheet for Patients:  EntrepreneurPulse.com.au  Fact Sheet for Healthcare Providers:  IncredibleEmployment.be  This test is no t yet approved or cleared by the [place] FDA and  has been authorized for detection and/or diagnosis of SARS-CoV-2 by FDA under an Emergency Use Authorization (EUA). This EUA will remain  in effect (meaning this test can be used) for the duration of the COVID-19 declaration under Section 564(b)(1) of the Act, 21 U.S.C.section 360bbb-3(b)(1), unless the authorization is terminated  or revoked sooner.       Influenza A by PCR NEGATIVE NEGATIVE Final   Influenza B by PCR NEGATIVE NEGATIVE Final    Comment: (NOTE) The Xpert Xpress SARS-CoV-2/FLU/RSV plus assay is intended as an aid in the diagnosis of influenza from Nasopharyngeal swab specimens and should not be used as a sole basis for treatment. Nasal washings and aspirates are unacceptable for Xpert Xpress SARS-CoV-2/FLU/RSV testing.  Fact Sheet for Patients: EntrepreneurPulse.com.au  Fact Sheet for Healthcare Providers: IncredibleEmployment.be  This test is not yet approved or cleared by the [place] FDA  and has been authorized for detection and/or diagnosis of SARS-CoV-2 by FDA under an Emergency Use Authorization (EUA). This EUA will remain in effect (meaning this test can be used) for the duration of the COVID-19 declaration  under Section 564(b)(1) of the Act, 21 U.S.C. section 360bbb-3(b)(1), unless the authorization is terminated or revoked.  Performed at [facility], [address], [place], [place] [address]      Management plans discussed with the patient, family and they are in agreement.  CODE STATUS:     Code Status Orders  (From admission, onward)         Start     Ordered   10/06/20 0929  Full code  Continuous        10/06/20 0930        Code Status History    This patient has a current code status but no historical code status.   Advance Care Planning Activity      TOTAL TIME TAKING CARE OF THIS PATIENT: 34 minutes.    Loletha Grayer M.D on 10/11/2020 at 8:47 AM  Between 7am to 6pm - Pager - [phone]  After 6pm go to [email or web address] - password [email or web address]  [facility]  CC: Primary care physician; Derinda Late, MD

## 2020-10-13 ENCOUNTER — Inpatient Hospital Stay
Admission: EM | Admit: 2020-10-13 | Discharge: 2020-10-15 | DRG: 871 | Disposition: A | Discharge status: Hospice | Payer: Medicare Other | Attending: Internal Medicine | Admitting: Internal Medicine

## 2020-10-13 ENCOUNTER — Encounter: Payer: Self-pay | Admitting: Emergency Medicine

## 2020-10-13 ENCOUNTER — Emergency Department: Payer: Medicare Other

## 2020-10-13 DIAGNOSIS — E1140 Type 2 diabetes mellitus with diabetic neuropathy, unspecified: Secondary | ICD-10-CM | POA: Diagnosis present

## 2020-10-13 DIAGNOSIS — Z888 Allergy status to other drugs, medicaments and biological substances status: Secondary | ICD-10-CM

## 2020-10-13 DIAGNOSIS — G9341 Metabolic encephalopathy: Secondary | ICD-10-CM | POA: Diagnosis present

## 2020-10-13 DIAGNOSIS — Z905 Acquired absence of kidney: Secondary | ICD-10-CM

## 2020-10-13 DIAGNOSIS — Z66 Do not resuscitate: Secondary | ICD-10-CM

## 2020-10-13 DIAGNOSIS — R410 Disorientation, unspecified: Secondary | ICD-10-CM | POA: Diagnosis not present

## 2020-10-13 DIAGNOSIS — Z85528 Personal history of other malignant neoplasm of kidney: Secondary | ICD-10-CM

## 2020-10-13 DIAGNOSIS — Z9049 Acquired absence of other specified parts of digestive tract: Secondary | ICD-10-CM

## 2020-10-13 DIAGNOSIS — T68XXXA Hypothermia, initial encounter: Secondary | ICD-10-CM

## 2020-10-13 DIAGNOSIS — R64 Cachexia: Secondary | ICD-10-CM | POA: Diagnosis present

## 2020-10-13 DIAGNOSIS — Z515 Encounter for palliative care: Secondary | ICD-10-CM

## 2020-10-13 DIAGNOSIS — K56609 Unspecified intestinal obstruction, unspecified as to partial versus complete obstruction: Secondary | ICD-10-CM | POA: Diagnosis present

## 2020-10-13 DIAGNOSIS — A419 Sepsis, unspecified organism: Secondary | ICD-10-CM

## 2020-10-13 DIAGNOSIS — Z91040 Latex allergy status: Secondary | ICD-10-CM

## 2020-10-13 DIAGNOSIS — Z79899 Other long term (current) drug therapy: Secondary | ICD-10-CM

## 2020-10-13 DIAGNOSIS — I10 Essential (primary) hypertension: Secondary | ICD-10-CM | POA: Diagnosis present

## 2020-10-13 DIAGNOSIS — E785 Hyperlipidemia, unspecified: Secondary | ICD-10-CM | POA: Diagnosis present

## 2020-10-13 DIAGNOSIS — Z833 Family history of diabetes mellitus: Secondary | ICD-10-CM

## 2020-10-13 DIAGNOSIS — Z794 Long term (current) use of insulin: Secondary | ICD-10-CM

## 2020-10-13 DIAGNOSIS — F0390 Unspecified dementia without behavioral disturbance: Secondary | ICD-10-CM

## 2020-10-13 DIAGNOSIS — R680 Hypothermia, not associated with low environmental temperature: Secondary | ICD-10-CM | POA: Diagnosis present

## 2020-10-13 DIAGNOSIS — I700 Atherosclerosis of aorta: Secondary | ICD-10-CM | POA: Diagnosis present

## 2020-10-13 DIAGNOSIS — E1122 Type 2 diabetes mellitus with diabetic chronic kidney disease: Secondary | ICD-10-CM | POA: Diagnosis present

## 2020-10-13 DIAGNOSIS — Z940 Kidney transplant status: Secondary | ICD-10-CM

## 2020-10-13 DIAGNOSIS — Z20822 Contact with and (suspected) exposure to covid-19: Secondary | ICD-10-CM | POA: Diagnosis present

## 2020-10-13 DIAGNOSIS — R112 Nausea with vomiting, unspecified: Secondary | ICD-10-CM

## 2020-10-13 DIAGNOSIS — N390 Urinary tract infection, site not specified: Secondary | ICD-10-CM

## 2020-10-13 DIAGNOSIS — Z8673 Personal history of transient ischemic attack (TIA), and cerebral infarction without residual deficits: Secondary | ICD-10-CM

## 2020-10-13 DIAGNOSIS — Z0189 Encounter for other specified special examinations: Secondary | ICD-10-CM

## 2020-10-13 DIAGNOSIS — D849 Immunodeficiency, unspecified: Secondary | ICD-10-CM | POA: Diagnosis present

## 2020-10-13 DIAGNOSIS — Z6820 Body mass index (BMI) 20.0-20.9, adult: Secondary | ICD-10-CM

## 2020-10-13 DIAGNOSIS — K3189 Other diseases of stomach and duodenum: Secondary | ICD-10-CM | POA: Diagnosis present

## 2020-10-13 DIAGNOSIS — K559 Vascular disorder of intestine, unspecified: Secondary | ICD-10-CM | POA: Diagnosis present

## 2020-10-13 DIAGNOSIS — I2510 Atherosclerotic heart disease of native coronary artery without angina pectoris: Secondary | ICD-10-CM | POA: Diagnosis present

## 2020-10-13 DIAGNOSIS — Z7952 Long term (current) use of systemic steroids: Secondary | ICD-10-CM

## 2020-10-13 NOTE — ED Notes (Signed)
Xray at bedside as well as pts wife. MD made aware.

## 2020-10-13 NOTE — ED Notes (Signed)
Report off to shannon rn

## 2020-10-13 NOTE — ED Provider Notes (Signed)
[facility] Emergency Department Provider Note  ____________________________________________   Event Date/Time   First MD Initiated Contact with Patient 10/13/20 2306     (approximate)  I have reviewed the triage vital signs and the nursing notes.   HISTORY  Chief Complaint Nausea (N/V since today. Pt has hx of bowel obstruction.)  Level 5 caveat: History is limited either by the patient's acute on chronic mental status or by acute illness.  HPI James Hicks is a 74 y.o. male with extensive medical history as listed below which includes dementia and prior CVA as well as renal cell carcinoma status post kidney transplant.  [facility]  for evaluation of nausea and vomiting.  The patient has a history of bowel obstruction.  He is unable to provide any history.  He will moan and moderate slightly if spoken to loudly.  Otherwise he has normal response although the nurses told me that he vigorously objected when they attempted to start a peripheral IV.  No additional history is available other than that which is in the system.  His baseline status is unclear.  It was reported that the patient is a DNR but that the facility forgot to send his paperwork with him.     Past Medical History:  Diagnosis Date  . Cholelithiasis   . Chronic renal failure    kidney transplant   . Colon polyps   . CVA (cerebral infarction)   . Degenerative joint disease   . Diabetes mellitus without complication ([place])   . Diabetic neuropathy ([place])   . Hydronephrosis of right kidney   . Hyperlipidemia   . Hypertension   . Renal cell carcinoma ([place])   . Stroke [facility])     Patient Active Problem List   Diagnosis Date Noted  . Acute metabolic encephalopathy   . Acute delirium   . Pneumatosis of intestines   . Lactic acidosis   . Vascular dementia without behavioral disturbance ([place])   . SBO (small bowel obstruction) ([place]) 10/06/2020  . Type 2 diabetes mellitus  without complication, with long-term current use of insulin ([place])   . Essential hypertension   . History of renal transplant   . Aspiration pneumonia [facility])     Past Surgical History:  Procedure Laterality Date  . CATARACT EXTRACTION    . CHOLECYSTECTOMY    . COLONOSCOPY    . COLONOSCOPY WITH PROPOFOL N/A 05/28/2018   Procedure: COLONOSCOPY WITH PROPOFOL;  Surgeon: Toledo, Benay Pike, MD;  Location: [facility] ENDOSCOPY;  Service: Gastroenterology;  Laterality: N/A;  . KIDNEY TRANSPLANT    . KNEE ARTHROSCOPY    . NEPHRECTOMY    . TONSILLECTOMY    . WISDOM TOOTH EXTRACTION      Prior to Admission medications   Medication Sig Start Date End Date Taking? Authorizing Provider  allopurinol (ZYLOPRIM) 100 MG tablet Take 100 mg by mouth daily.    [provider]  atorvastatin (LIPITOR) 10 MG tablet Take 10 mg by mouth daily.    [provider]  calcium-vitamin D (OSCAL WITH D) 500-200 MG-UNIT tablet Take 1 tablet by mouth 2 (two) times daily.    [provider]  DEPAKOTE SPRINKLES 125 MG capsule Take 125 mg by mouth 2 (two) times daily. 09/26/20   [provider]  diclofenac Sodium (VOLTAREN) 1 % GEL Apply 2 g topically 4 (four) times daily. To right shoulder and lower back    [provider]  DULCOLAX 5 MG EC  tablet Take 5 mg by mouth 2 (two) times daily as needed. 09/26/20   [provider]  FLOMAX 0.4 MG CAPS capsule Take 0.8 mg by mouth at bedtime. 09/26/20   [provider]  insulin aspart (NOVOLOG) 100 UNIT/ML injection Inject 1-7 Units into the skin 4 (four) times daily. Per Sliding Scale ( 150-200: 1u, 201-249: 2u, 250-299: 3u, 300-349: 4u, 350-399: 5u, If greater than 399: 7u. 09/26/20   [provider]  insulin glargine (LANTUS) 100 UNIT/ML injection Inject 0.07 mLs (7 Units total) into the skin at bedtime. 10/11/20   Wieting, Richard, MD  MELATONIN MAXIMUM STRENGTH 5 MG TABS Take 5 mg by mouth at bedtime. 09/26/20    [provider]  metoprolol succinate (TOPROL-XL) 25 MG 24 hr tablet Take 25 mg by mouth daily. 07/31/18   [provider]  mycophenolate (CELLCEPT) 500 MG tablet Take 500 mg by mouth 2 (two) times daily.    [provider]  pantoprazole (PROTONIX) 40 MG tablet Take 1 tablet (40 mg total) by mouth daily. 10/11/20 10/11/21  Loletha Grayer, MD  polyethylene glycol (MIRALAX / GLYCOLAX) 17 g packet Take 17 g by mouth daily as needed for moderate constipation. 10/11/20   Loletha Grayer, MD  predniSONE (DELTASONE) 10 MG tablet Take 1 tablet (10 mg total) by mouth daily. 07/05/19   Cuthriell, Charline Bills, PA-C  SENNA PLUS 8.6-50 MG tablet Take 1 tablet by mouth daily. 09/26/20   [provider]  SEROQUEL 50 MG tablet Take 100 mg by mouth at bedtime. 09/26/20   [provider]  tacrolimus (PROGRAF) 1 MG capsule Take 5 mg by mouth daily. 0900    [provider]  tacrolimus (PROGRAF) 1 MG capsule Take 6 mg by mouth at bedtime. 2100    [provider]  traZODone (DESYREL) 50 MG tablet Take 100 mg by mouth at bedtime. 09/26/20   [provider]  TYLENOL 325 MG tablet Take 650 mg by mouth every 6 (six) hours as needed. 09/26/20   [provider]  colchicine 0.6 MG tablet Take 0.6 mg by mouth daily. Patient not taking: Reported on 10/06/2020  10/07/20  [provider]  enalapril (VASOTEC) 20 MG tablet Take 20 mg by mouth daily. Patient not taking: Reported on 10/06/2020  10/07/20  [provider]  furosemide (LASIX) 20 MG tablet Take 20 mg by mouth 2 (two) times daily. Patient not taking: Reported on 10/06/2020  10/07/20  [provider]    Allergies Hylan g-f 20, Latex, and Other  Family History  Problem Relation Age of Onset  . Colon cancer Mother   . Diabetes Mother   . Breast cancer Sister     Social History Social History   Tobacco Use  . Smoking status: Never Smoker  . Smokeless  tobacco: Never Used  Vaping Use  . Vaping Use: Never used  Substance Use Topics  . Alcohol use: Not Currently  . Drug use: Not Currently    Review of Systems Level 5 caveat: History is limited either by the patient's acute on chronic mental status or by acute illness.   ____________________________________________   PHYSICAL EXAM:  ED Triage Vitals  Enc Vitals Group     BP 10/13/20 2240 135/70     Pulse Rate 10/13/20 2240 79     Resp 10/13/20 2310 (!) 22     Temp 10/14/20 0101 (!) 96.6 F (35.9 C)     Temp Source 10/14/20 0101 Rectal  SpO2 10/13/20 2240 100 %     Weight --      Height --      Head Circumference --      [place] Flow --      Pain Score --      Pain Loc --      Pain Edu? --      Excl. in [place]? --      Constitutional: Patient responds minimally to stimuli but is protecting his airway.  Appearance of chronic illness.  Smells strongly of stool although the patient has no visible stool on him at this time. Eyes: Conjunctivae are normal.  Head: Atraumatic. Nose: No congestion/rhinnorhea. Mouth/Throat: Patient is wearing a mask. Neck: No stridor.  No meningeal signs.   Cardiovascular: Normal rate, regular rhythm. Good peripheral circulation. Respiratory: Normal respiratory effort.  No retractions. Gastrointestinal: Soft with no obvious distention.  Patient moans to palpation of the abdomen without any specific localization.  No obvious rebound or guarding Musculoskeletal: No lower extremity tenderness nor edema. No gross deformities of extremities. Neurologic: Patient cannot or will not participate in a neurological exam.  There is no obvious facial droop.  He briefly moves all 4 extremities while I was in the room but otherwise is minimally responsive.  GCS seems to vary between 8 and 10 (Eye 1, Verbal 2 for me, 4 for nurses, Motor 5) Skin:  Skin is warm, dry and intact.   ____________________________________________   LABS (all labs ordered are listed,  but only abnormal results are displayed)  Labs Reviewed  COMPREHENSIVE METABOLIC PANEL - Abnormal; Notable for the following components:      Result Value   Sodium 134 (*)    Glucose, Bld 258 (*)    Albumin 3.1 (*)    All other components within normal limits  URINALYSIS, ROUTINE W REFLEX MICROSCOPIC - Abnormal; Notable for the following components:   Color, Urine YELLOW (*)    APPearance HAZY (*)    Glucose, UA >=500 (*)    Hgb urine dipstick SMALL (*)    Protein, ur >=300 (*)    Leukocytes,Ua MODERATE (*)    WBC, UA >50 (*)    All other components within normal limits  CBC WITH DIFFERENTIAL/PLATELET - Abnormal; Notable for the following components:   Hemoglobin 12.5 (*)    All other components within normal limits  VALPROIC ACID LEVEL - Abnormal; Notable for the following components:   Valproic Acid Lvl 11 (*)    All other components within normal limits  TROPONIN I (HIGH SENSITIVITY) - Abnormal; Notable for the following components:   Troponin I (High Sensitivity) 18 (*)    All other components within normal limits  TROPONIN I (HIGH SENSITIVITY) - Abnormal; Notable for the following components:   Troponin I (High Sensitivity) 18 (*)    All other components within normal limits  CULTURE, BLOOD (SINGLE)  URINE CULTURE  CULTURE, BLOOD (SINGLE)  LIPASE, BLOOD  URINE DRUG SCREEN, QUALITATIVE ([facility] ONLY)  ETHANOL  LACTIC ACID, PLASMA  LACTIC ACID, PLASMA  PROTIME-INR  PROCALCITONIN   ____________________________________________  EKG  ED ECG REPORT I, Hinda Kehr, the attending physician, personally viewed and interpreted this ECG.  Date: 10/13/2020 EKG Time: 22: 48 Rate: 70 Rhythm: normal sinus rhythm QRS Axis: normal Intervals: normal ST/T Wave abnormalities: Non-specific ST segment / T-wave changes, but no clear evidence of acute ischemia. Narrative Interpretation: no definitive evidence of acute ischemia; does not meet STEMI  criteria.   ____________________________________________  RADIOLOGY I,  Hinda Kehr, personally viewed and evaluated these images (plain radiographs) as part of my medical decision making, as well as reviewing the written report by the radiologist.  I also discussed the case by phone with the radiologist.  ED MD interpretation: Extensive gastric pneumatosis that has spread into the biliary system identified on CT scan.  No evidence of pneumonia.  No acute abnormality identified on chest x-ray.  Official radiology report(s): CT CHEST ABDOMEN PELVIS W CONTRAST  Result Date: 10/14/2020 CLINICAL DATA:  Nausea, vomiting. EXAM: CT CHEST, ABDOMEN, AND PELVIS WITH CONTRAST TECHNIQUE: Multidetector CT imaging of the chest, abdomen and pelvis was performed following the standard protocol during bolus administration of intravenous contrast. CONTRAST:  20mL OMNIPAQUE IOHEXOL 300 MG/ML  SOLN COMPARISON:  10/06/2020 FINDINGS: CT CHEST FINDINGS Cardiovascular: Coronary artery and aortic calcifications. Heart is normal size. Aorta is normal caliber. Mediastinum/Nodes: No mediastinal, hilar, or axillary adenopathy. Trachea and esophagus are unremarkable. Thyroid unremarkable. Previously seen pneumatosis around the distal esophagus has decreased. A small amount pneumatosis now present. Lungs/Pleura: Small bilateral pleural effusions. Bibasilar atelectasis. Musculoskeletal: Chest wall soft tissues are unremarkable. No acute bony abnormality. CT ABDOMEN PELVIS FINDINGS Hepatobiliary: Extensive portal venous gas throughout the liver. No focal hepatic abnormality. Prior cholecystectomy. Pancreas: No focal abnormality or ductal dilatation. Spleen: No focal abnormality.  Normal size. Adrenals/Urinary Tract: Bilateral nephrectomy. Adrenal glands unremarkable. Left lower quadrant renal transplant. No hydronephrosis. Urinary bladder unremarkable. Stomach/Bowel: There is pneumatosis throughout the wall of the stomach which is  markedly distended with gas and fluid. Large and small bowel loops grossly unremarkable. Mildly prominent scattered small bowel loops without transition, wall thickening or pneumatosis. Favor ileus. Vascular/Lymphatic: Heavily calcified aorta. No evidence of aneurysm or adenopathy. Reproductive: No visible focal abnormality. Other: Moderate amount of free fluid in the left abdomen and pelvis. No free air. Musculoskeletal: No acute bony abnormality. IMPRESSION: Extensive gastric pneumatosis which also extends superiorly along the distal esophagus. Associated extensive portal venous gas throughout the liver. Findings concerning for ischemic bowel. Stomach is distended with gas and fluid/debris. Moderate free fluid in the left abdomen and pelvis. Small bilateral pleural effusions, bibasilar atelectasis. Coronary artery disease, aortic atherosclerosis. Critical Value/emergent results were called by telephone at the time of interpretation on 10/14/2020 at 1:49 am to provider [facility] , who verbally acknowledged these results. Electronically Signed   By: Rolm Baptise M.D.   On: 10/14/2020 01:51   DG Chest Port 1 View  Result Date: 10/13/2020 CLINICAL DATA:  74 year old male with concern for sepsis. EXAM: PORTABLE CHEST 1 VIEW COMPARISON:  Chest radiograph dated 08/13/2018. FINDINGS: Mild diffuse interstitial prominence, likely atelectasis. No focal consolidation, pleural effusion, pneumothorax. The cardiac silhouette is within limits. No acute osseous pathology. Air under the left hemidiaphragm, likely within the stomach. IMPRESSION: No acute cardiopulmonary process. Electronically Signed   By: Anner Crete M.D.   On: 10/13/2020 23:34    ____________________________________________   PROCEDURES   Procedure(s) performed (including Critical Care):  .Critical Care Performed by: Hinda Kehr, MD Authorized by: Hinda Kehr, MD   Critical care provider statement:    Critical care time (minutes):   60   Critical care time was exclusive of:  Separately billable procedures and treating other patients   Critical care was necessary to treat or prevent imminent or life-threatening deterioration of the following conditions:  Sepsis   Critical care was time spent personally by me on the following activities:  Development of treatment plan with patient or surrogate, discussions with consultants, evaluation  of patient's response to treatment, examination of patient, obtaining history from patient or surrogate, ordering and performing treatments and interventions, ordering and review of laboratory studies, ordering and review of radiographic studies, pulse oximetry, re-evaluation of patient's condition and review of old charts .1-3 Lead EKG Interpretation Performed by: Hinda Kehr, MD Authorized by: Hinda Kehr, MD     Interpretation: normal     ECG rate:  80   ECG rate assessment: normal     Rhythm: sinus rhythm     Ectopy: none     Conduction: normal       ____________________________________________   INITIAL IMPRESSION / MDM / ASSESSMENT AND PLAN / ED COURSE  As part of my medical decision making, I reviewed the following data within the [place] History obtained from family, Nursing notes reviewed and incorporated, Labs reviewed , EKG interpreted , Old chart reviewed, Radiograph reviewed , Discussed with surgery (Dr. Lysle Pearl), Discussed with admitting physician (Dr. Tobie Poet), Discussed with radiologist (Dr. Ardeen Garland), A consult was requested and obtained from this/these consultant(s) Surgery and Notes from prior ED visits   Differential diagnosis includes, but is not limited to, recurrent small bowel obstruction, ileus, metabolic encephalopathy, lactic acidosis, nonspecific electrolyte or metabolic abnormality, renal failure, pneumonia.  The patient is on the cardiac monitor to evaluate for evidence of arrhythmia and/or significant heart rate changes.  I reviewed the  medical record and see that the patient was discharged 2 days ago from this hospital after an episode of small bowel obstruction which was managed conservatively.  He improved while in the hospital with an advancing diet and was found to be appropriate for discharge.  He had pneumatosis of the bowel and GI did not want to perform a colonoscopy due to the high risk of perforation.  He was also diagnosed with a mild pneumonia at the time and was noted to have metabolic encephalopathy and lactic acidosis when he was admitted which reportedly improved.  His wife is on the way and should be able to provide some additional history and confirm his CODE STATUS.  Broad laboratory evaluation is pending right now.  We will obtain a rectal temperature and urine specimen via catheterization.  No evidence of ischemia on EKG.  Vital signs stable.     Clinical Course as of 10/14/20 0354  Fri Oct 14, 2020  0008 DG Chest [place] 1 View I personally reviewed the patient's imaging and agree with the radiologist's interpretation that there is no evidence of any emergent condition on his chest x-ray including obvious pneumonia. [CF]  0038 I had an extended conversation with the patient's wife (and legal guardian ) who is now at bedside.  She confirmed his advanced dementia and recent illnesses including his recent discharge from the hospital for the problems listed above.  We had an extended conversation about CODE STATUS and it is both her strong preference, and she believes would be the patient's preference if he had the capacity to make his own decision, to be made DNR/DNI.  She still wants to proceed with any appropriate treatment for reversible causes, and she understands that we will not perform chest compressions or intubate the patient and put him on a ventilator if his heart were to stop or if he were to stop breathing.  I think this is very appropriate given his comorbidities and long-term illness including severe  dementia.  I filled out the [facility] DNR/DNI form and showed it to the patient's wife and we went over  together and she approved.  I put the DNR order into the computer and am keeping the paperwork with him.  The medical work-up is proceeding.  The conversation was witnessed by the patient's nurses, Larene Beach and Jinny Blossom. [CF]  0101 CBC is normal, comprehensive metabolic panel is essentially normal other than some hyperglycemia and very mild hyponatremia, high-sensitivity troponin is 18 which is actually down slightly from his prior value of 23 8 days ago.  Lactic acid is normal. [CF]  0102 Though the chest x-ray was unremarkable, the patient is at high risk for aspiration and had a recently diagnosed pneumonia.  He is also apparently delirious with concern for pneumatosis and small bowel obstruction given that this is his presentation previously.  I am obtaining a CT chest with IV contrast to further evaluate for respiratory illness with a nondiagnostic chest x-ray, as well as a CT abdomen/pelvis with IV contrast to evaluate for recurrent or persistent bowel obstruction. [CF]  0103 Also giving a small fluid bolus of 500 mL normal saline [CF]  0110 After initially ordering the 500 mL normal saline bolus, I saw that the nurses had obtain a rectal temperature of 96.6.  He meets sepsis criteria with a respiratory rate of nearly 30, a core temperature of 96.6, and a presumed source of infection, most likely intra-abdominal but also possibly pneumonia/aspiration.  I made the patient code sepsis and ordered 1 L lactated Ringer's (he has not a septic shock presentation and has a normal lactic acid) and empiric antibiotics of cefepime 2 g IV, vancomycin 1 g IV, and metronidazole 500 mg IV (he is not currently safe to take oral metronidazole in spite of the national shortage).  Imaging is pending. [CF]  0113 Urinalysis, Routine w reflex microscopic(!) Grossly positive urine is likely the source of infection but we  will continue to investigate other possibilities. [CF]  0203 CT CHEST ABDOMEN PELVIS W CONTRAST The radiologist, Dr. Ardeen Garland, called me to let me know the pneumatosis seems to be spreading and that there is further indication of ischemic bowel.  I called and spoke by phone with Dr. Lysle Pearl with general surgery and explained the situation as well as the recent discussion I had with the patient's wife about DNR/DNI and possible palliative care.  He will come to the ED to see the patient and discussed the plan of care with the patient's wife. [CF]  5956 Dr. Lysle Pearl evaluated the patient in person and had extensive conversation with the patient's wife.  The patient is not a good candidate for surgery and his presentation is somewhat contradictory.  Dr. Luvenia Heller currently does not believe the patient has ischemic gut but certainly has concerning findings and a presentation consistent with sepsis of unclear infectious etiology.  The current plan is for placement of NG tube and admission to the hospitalist service for sepsis management.  The patient will likely benefit from palliative care consult as well given his comorbidities and severity of his illness.  I reiterated the grave prognosis with patient's wife prior to Dr. Ines Bloomer discussion with the wife and she understands that he is gravely ill.  Dr. Nicki Reaper was also in contact with the [facility] transplant team and they will pass along the word to the transplant surgeon later today to be in touch regarding the patient's situation, but they agree that there is not much else to be done at this time. [CF]  [phone] Discussed case by phone with Dr. Tobie Poet with the hospitalist service.  Updated her regarding  the surgery consultation and family discussions.  She will admit. [CF]    Clinical Course User Index [CF] Hinda Kehr, MD     ____________________________________________  FINAL CLINICAL IMPRESSION(S) / ED DIAGNOSES  Final diagnoses:  Sepsis, due to unspecified organism,  unspecified whether acute organ dysfunction present ([place])  Hypothermia, initial encounter  Urinary tract infection without hematuria, site unspecified  Delirium  Dementia without behavioral disturbance, unspecified dementia type ([place])  History of kidney transplant  Intractable vomiting with nausea, unspecified vomiting type  DNR (do not resuscitate)     MEDICATIONS GIVEN DURING THIS VISIT:  Medications  vancomycin (VANCOREADY) IVPB 1500 mg/300 mL (1,500 mg Intravenous New Bag/Given 10/14/20 0226)  ondansetron (ZOFRAN) injection 4 mg (4 mg Intravenous Given 10/14/20 0104)  lactated ringers bolus 1,000 mL (0 mLs Intravenous Stopped 10/14/20 0319)  ceFEPIme (MAXIPIME) 2 g in sodium chloride 0.9 % 100 mL IVPB (0 g Intravenous Stopped 10/14/20 0223)  metroNIDAZOLE (FLAGYL) IVPB 500 mg (0 mg Intravenous Stopped 10/14/20 0319)  iohexol (OMNIPAQUE) 300 MG/ML solution 75 mL (75 mLs Intravenous Contrast Given 10/14/20 0130)     ED Discharge Orders    None      *Please note:  James Hicks was evaluated in Emergency Department on 10/14/2020 for the symptoms described in the history of present illness. He was evaluated in the context of the global COVID-19 pandemic, which necessitated consideration that the patient might be at risk for infection with the SARS-CoV-2 virus that causes COVID-19. Institutional protocols and algorithms that pertain to the evaluation of patients at risk for COVID-19 are in a state of rapid change based on information released by regulatory bodies including the CDC and federal and state organizations. These policies and algorithms were followed during the patient's care in the ED.  Some ED evaluations and interventions may be delayed as a result of limited staffing during and after the pandemic.*  Note:  This document was prepared using Dragon voice recognition software and may include unintentional dictation errors.   Hinda Kehr, MD 10/14/20 [phone]

## 2020-10-14 ENCOUNTER — Emergency Department: Payer: Medicare Other

## 2020-10-14 DIAGNOSIS — K56609 Unspecified intestinal obstruction, unspecified as to partial versus complete obstruction: Secondary | ICD-10-CM | POA: Diagnosis present

## 2020-10-14 DIAGNOSIS — K559 Vascular disorder of intestine, unspecified: Secondary | ICD-10-CM | POA: Diagnosis present

## 2020-10-14 DIAGNOSIS — Z85528 Personal history of other malignant neoplasm of kidney: Secondary | ICD-10-CM | POA: Diagnosis not present

## 2020-10-14 DIAGNOSIS — T68XXXA Hypothermia, initial encounter: Secondary | ICD-10-CM

## 2020-10-14 DIAGNOSIS — R6520 Severe sepsis without septic shock: Secondary | ICD-10-CM | POA: Diagnosis not present

## 2020-10-14 DIAGNOSIS — I700 Atherosclerosis of aorta: Secondary | ICD-10-CM | POA: Diagnosis present

## 2020-10-14 DIAGNOSIS — G9340 Encephalopathy, unspecified: Secondary | ICD-10-CM

## 2020-10-14 DIAGNOSIS — D849 Immunodeficiency, unspecified: Secondary | ICD-10-CM | POA: Diagnosis present

## 2020-10-14 DIAGNOSIS — Z66 Do not resuscitate: Secondary | ICD-10-CM | POA: Diagnosis present

## 2020-10-14 DIAGNOSIS — R112 Nausea with vomiting, unspecified: Secondary | ICD-10-CM

## 2020-10-14 DIAGNOSIS — R410 Disorientation, unspecified: Secondary | ICD-10-CM | POA: Diagnosis present

## 2020-10-14 DIAGNOSIS — E1122 Type 2 diabetes mellitus with diabetic chronic kidney disease: Secondary | ICD-10-CM | POA: Diagnosis present

## 2020-10-14 DIAGNOSIS — N390 Urinary tract infection, site not specified: Secondary | ICD-10-CM | POA: Diagnosis present

## 2020-10-14 DIAGNOSIS — A419 Sepsis, unspecified organism: Secondary | ICD-10-CM | POA: Diagnosis present

## 2020-10-14 DIAGNOSIS — Z20822 Contact with and (suspected) exposure to covid-19: Secondary | ICD-10-CM | POA: Diagnosis present

## 2020-10-14 DIAGNOSIS — Z515 Encounter for palliative care: Secondary | ICD-10-CM | POA: Diagnosis not present

## 2020-10-14 DIAGNOSIS — I2510 Atherosclerotic heart disease of native coronary artery without angina pectoris: Secondary | ICD-10-CM | POA: Diagnosis present

## 2020-10-14 DIAGNOSIS — Z940 Kidney transplant status: Secondary | ICD-10-CM | POA: Diagnosis not present

## 2020-10-14 DIAGNOSIS — Z8673 Personal history of transient ischemic attack (TIA), and cerebral infarction without residual deficits: Secondary | ICD-10-CM | POA: Diagnosis not present

## 2020-10-14 DIAGNOSIS — R64 Cachexia: Secondary | ICD-10-CM | POA: Diagnosis present

## 2020-10-14 DIAGNOSIS — F0390 Unspecified dementia without behavioral disturbance: Secondary | ICD-10-CM | POA: Diagnosis present

## 2020-10-14 DIAGNOSIS — R1110 Vomiting, unspecified: Secondary | ICD-10-CM | POA: Insufficient documentation

## 2020-10-14 DIAGNOSIS — I10 Essential (primary) hypertension: Secondary | ICD-10-CM | POA: Diagnosis present

## 2020-10-14 DIAGNOSIS — B998 Other infectious disease: Secondary | ICD-10-CM | POA: Diagnosis not present

## 2020-10-14 DIAGNOSIS — Z7189 Other specified counseling: Secondary | ICD-10-CM | POA: Insufficient documentation

## 2020-10-14 DIAGNOSIS — Z9049 Acquired absence of other specified parts of digestive tract: Secondary | ICD-10-CM | POA: Diagnosis not present

## 2020-10-14 DIAGNOSIS — E785 Hyperlipidemia, unspecified: Secondary | ICD-10-CM | POA: Diagnosis present

## 2020-10-14 DIAGNOSIS — R680 Hypothermia, not associated with low environmental temperature: Secondary | ICD-10-CM | POA: Diagnosis present

## 2020-10-14 DIAGNOSIS — K3189 Other diseases of stomach and duodenum: Secondary | ICD-10-CM | POA: Diagnosis present

## 2020-10-14 DIAGNOSIS — Z833 Family history of diabetes mellitus: Secondary | ICD-10-CM | POA: Diagnosis not present

## 2020-10-14 DIAGNOSIS — E1140 Type 2 diabetes mellitus with diabetic neuropathy, unspecified: Secondary | ICD-10-CM | POA: Diagnosis present

## 2020-10-14 DIAGNOSIS — G9341 Metabolic encephalopathy: Secondary | ICD-10-CM | POA: Diagnosis present

## 2020-10-14 LAB — URINALYSIS, ROUTINE W REFLEX MICROSCOPIC
Bacteria, UA: NONE SEEN
Bilirubin Urine: NEGATIVE
Glucose, UA: >=500 mg/dL — AB
Ketones, ur: NEGATIVE mg/dL
Nitrite: NEGATIVE
Protein, ur: >=300 mg/dL — AB
Specific Gravity, Urine: 1.018 (ref 1.005–1.030)
WBC, UA: >50 WBC/hpf — ABNORMAL HIGH (ref 0–5)
pH: 5 (ref 5.0–8.0)

## 2020-10-14 LAB — CBC WITH DIFFERENTIAL/PLATELET
Abs Immature Granulocytes: 0.03 10*3/uL (ref 0.00–0.07)
Basophils Absolute: 0 10*3/uL (ref 0.0–0.1)
Basophils Relative: 0 %
Eosinophils Absolute: 0 10*3/uL (ref 0.0–0.5)
Eosinophils Relative: 0 %
HCT: 40 % (ref 39.0–52.0)
Hemoglobin: 12.5 g/dL — ABNORMAL LOW (ref 13.0–17.0)
Immature Granulocytes: 0 %
Lymphocytes Relative: 16 %
Lymphs Abs: 1.1 10*3/uL (ref 0.7–4.0)
MCH: 26 pg (ref 26.0–34.0)
MCHC: 31.3 g/dL (ref 30.0–36.0)
MCV: 83.2 fL (ref 80.0–100.0)
Monocytes Absolute: 0.5 10*3/uL (ref 0.1–1.0)
Monocytes Relative: 6 %
Neutro Abs: 5.4 10*3/uL (ref 1.7–7.7)
Neutrophils Relative %: 78 %
Platelets: 197 10*3/uL (ref 150–400)
RBC: 4.81 MIL/uL (ref 4.22–5.81)
RDW: 15.2 % (ref 11.5–15.5)
WBC: 7.1 10*3/uL (ref 4.0–10.5)
nRBC: 0 % (ref 0.0–0.2)

## 2020-10-14 LAB — COMPREHENSIVE METABOLIC PANEL
ALT: 13 U/L (ref 0–44)
AST: 15 U/L (ref 15–41)
Albumin: 3.1 g/dL — ABNORMAL LOW (ref 3.5–5.0)
Alkaline Phosphatase: 54 U/L (ref 38–126)
Anion gap: 9 (ref 5–15)
BUN: 18 mg/dL (ref 8–23)
CO2: 25 mmol/L (ref 22–32)
Calcium: 9 mg/dL (ref 8.9–10.3)
Chloride: 100 mmol/L (ref 98–111)
Creatinine, Ser: 0.88 mg/dL (ref 0.61–1.24)
GFR, Estimated: >60 mL/min (ref >60)
Glucose, Bld: 258 mg/dL — ABNORMAL HIGH (ref 70–99)
Potassium: 3.9 mmol/L (ref 3.5–5.1)
Sodium: 134 mmol/L — ABNORMAL LOW (ref 135–145)
Total Bilirubin: 0.5 mg/dL (ref 0.3–1.2)
Total Protein: 6.7 g/dL (ref 6.5–8.1)

## 2020-10-14 LAB — PROCALCITONIN: Procalcitonin: <0.1 ng/mL

## 2020-10-14 LAB — URINE DRUG SCREEN, QUALITATIVE (ARMC ONLY)
Amphetamines, Ur Screen: NOT DETECTED
Barbiturates, Ur Screen: NOT DETECTED
Benzodiazepine, Ur Scrn: NOT DETECTED
Cannabinoid 50 Ng, Ur ~~LOC~~: NOT DETECTED
Cocaine Metabolite,Ur ~~LOC~~: NOT DETECTED
MDMA (Ecstasy)Ur Screen: NOT DETECTED
Methadone Scn, Ur: NOT DETECTED
Opiate, Ur Screen: NOT DETECTED
Phencyclidine (PCP) Ur S: NOT DETECTED
Tricyclic, Ur Screen: NOT DETECTED

## 2020-10-14 LAB — PROTIME-INR
INR: 1 (ref 0.8–1.2)
Prothrombin Time: 12.7 seconds (ref 11.4–15.2)

## 2020-10-14 LAB — RESP PANEL BY RT-PCR (FLU A&B, COVID) ARPGX2
Influenza A by PCR: NEGATIVE
Influenza B by PCR: NEGATIVE
SARS Coronavirus 2 by RT PCR: NEGATIVE

## 2020-10-14 LAB — CORTISOL-AM, BLOOD: Cortisol - AM: 23.8 ug/dL — ABNORMAL HIGH (ref 6.7–22.6)

## 2020-10-14 LAB — LIPASE, BLOOD: Lipase: 34 U/L (ref 11–51)

## 2020-10-14 LAB — TROPONIN I (HIGH SENSITIVITY)
Troponin I (High Sensitivity): 18 ng/L — ABNORMAL HIGH (ref <18)
Troponin I (High Sensitivity): 18 ng/L — ABNORMAL HIGH (ref <18)

## 2020-10-14 LAB — ETHANOL: Alcohol, Ethyl (B): <10 mg/dL (ref <10)

## 2020-10-14 LAB — VALPROIC ACID LEVEL: Valproic Acid Lvl: 11 ug/mL — ABNORMAL LOW (ref 50.0–100.0)

## 2020-10-14 LAB — LACTIC ACID, PLASMA
Lactic Acid, Venous: 1.2 mmol/L (ref 0.5–1.9)
Lactic Acid, Venous: 1.5 mmol/L (ref 0.5–1.9)

## 2020-10-14 MED ORDER — ENOXAPARIN SODIUM 40 MG/0.4ML ~~LOC~~ SOLN
40.0000 mg | SUBCUTANEOUS | Status: DC
  Administered 2020-10-14: 09:00:00 40 mg via SUBCUTANEOUS
  Filled 2020-10-14: qty 0.4

## 2020-10-14 MED ORDER — VANCOMYCIN HCL IN DEXTROSE 1-5 GM/200ML-% IV SOLN
1000.0000 mg | Freq: Once | INTRAVENOUS | Status: DC
  Filled 2020-10-14: qty 200

## 2020-10-14 MED ORDER — ONDANSETRON HCL 4 MG/2ML IJ SOLN
INTRAMUSCULAR | Status: AC
  Administered 2020-10-14: 01:00:00 4 mg via INTRAVENOUS
  Filled 2020-10-14: qty 2

## 2020-10-14 MED ORDER — ONDANSETRON HCL 4 MG PO TABS: 4.0000 mg | ORAL_TABLET | Freq: Four times a day (QID) | ORAL | Status: DC | PRN

## 2020-10-14 MED ORDER — HALOPERIDOL LACTATE 5 MG/ML IJ SOLN
0.5000 mg | INTRAMUSCULAR | Status: DC | PRN
  Administered 2020-10-14: 17:00:00 0.5 mg via INTRAVENOUS
  Filled 2020-10-14: qty 1

## 2020-10-14 MED ORDER — VANCOMYCIN HCL 750 MG/150ML IV SOLN
750.0000 mg | Freq: Two times a day (BID) | INTRAVENOUS | Status: DC
  Administered 2020-10-14: 15:00:00 750 mg via INTRAVENOUS
  Filled 2020-10-14 (×3): qty 150

## 2020-10-14 MED ORDER — ACETAMINOPHEN 650 MG RE SUPP: 325.0000 mg | Freq: Four times a day (QID) | RECTAL | Status: DC | PRN

## 2020-10-14 MED ORDER — BIOTENE DRY MOUTH MT LIQD: 15.0000 mL | OROMUCOSAL | Status: DC | PRN

## 2020-10-14 MED ORDER — PREDNISONE 10 MG PO TABS
10.0000 mg | ORAL_TABLET | Freq: Every day | ORAL | Status: DC
  Administered 2020-10-14: 09:00:00 10 mg via ORAL
  Filled 2020-10-14: qty 1

## 2020-10-14 MED ORDER — METOPROLOL SUCCINATE ER 50 MG PO TB24
25.0000 mg | ORAL_TABLET | Freq: Every day | ORAL | Status: DC
  Administered 2020-10-14: 09:00:00 25 mg via ORAL
  Filled 2020-10-14: qty 1

## 2020-10-14 MED ORDER — TRAZODONE HCL 100 MG PO TABS: 100.0000 mg | ORAL_TABLET | Freq: Every day | ORAL | Status: DC

## 2020-10-14 MED ORDER — LORAZEPAM 2 MG/ML IJ SOLN: 1.0000 mg | INTRAMUSCULAR | Status: DC | PRN

## 2020-10-14 MED ORDER — ACETAMINOPHEN 325 MG PO TABS: 325.0000 mg | ORAL_TABLET | Freq: Four times a day (QID) | ORAL | Status: DC | PRN

## 2020-10-14 MED ORDER — SODIUM CHLORIDE 0.9 % IV SOLN
2.0000 g | Freq: Once | INTRAVENOUS | Status: AC
  Administered 2020-10-14: 01:00:00 2 g via INTRAVENOUS
  Filled 2020-10-14: qty 2

## 2020-10-14 MED ORDER — SODIUM CHLORIDE 0.9 % IV SOLN
2.0000 g | Freq: Three times a day (TID) | INTRAVENOUS | Status: DC
  Administered 2020-10-14: 10:00:00 2 g via INTRAVENOUS
  Filled 2020-10-14: qty 2

## 2020-10-14 MED ORDER — LACTATED RINGERS IV SOLN: INTRAVENOUS | Status: DC

## 2020-10-14 MED ORDER — IOHEXOL 300 MG/ML  SOLN
75.0000 mL | Freq: Once | INTRAMUSCULAR | Status: AC | PRN
  Administered 2020-10-14: 02:00:00 75 mL via INTRAVENOUS

## 2020-10-14 MED ORDER — TACROLIMUS 1 MG PO CAPS
5.0000 mg | ORAL_CAPSULE | Freq: Every day | ORAL | Status: DC
  Administered 2020-10-14: 14:00:00 5 mg via ORAL
  Filled 2020-10-14: qty 5

## 2020-10-14 MED ORDER — QUETIAPINE FUMARATE 25 MG PO TABS: 100.0000 mg | ORAL_TABLET | Freq: Every day | ORAL | Status: DC

## 2020-10-14 MED ORDER — MORPHINE SULFATE (PF) 4 MG/ML IV SOLN: 4.0000 mg | INTRAVENOUS | Status: AC | PRN

## 2020-10-14 MED ORDER — MYCOPHENOLATE MOFETIL 250 MG PO CAPS
500.0000 mg | ORAL_CAPSULE | Freq: Two times a day (BID) | ORAL | Status: DC
  Administered 2020-10-14: 14:00:00 500 mg via ORAL
  Filled 2020-10-14 (×2): qty 2

## 2020-10-14 MED ORDER — LORAZEPAM 2 MG/ML PO CONC: 1.0000 mg | ORAL | Status: DC | PRN

## 2020-10-14 MED ORDER — BIOTENE DRY MOUTH MT LIQD
15.0000 mL | Freq: Two times a day (BID) | OROMUCOSAL | Status: DC
  Filled 2020-10-14: qty 15

## 2020-10-14 MED ORDER — HALOPERIDOL 0.5 MG PO TABS
0.5000 mg | ORAL_TABLET | ORAL | Status: DC | PRN
  Filled 2020-10-14: qty 1

## 2020-10-14 MED ORDER — MORPHINE SULFATE (CONCENTRATE) 10 MG/0.5ML PO SOLN: 5.0000 mg | ORAL | Status: DC | PRN

## 2020-10-14 MED ORDER — LORAZEPAM 1 MG PO TABS: 1.0000 mg | ORAL_TABLET | ORAL | Status: DC | PRN

## 2020-10-14 MED ORDER — HALOPERIDOL 2 MG PO TABS
2.0000 mg | ORAL_TABLET | ORAL | Status: DC | PRN
  Filled 2020-10-14: qty 1

## 2020-10-14 MED ORDER — TACROLIMUS 1 MG PO CAPS: 6.0000 mg | ORAL_CAPSULE | Freq: Every day | ORAL | Status: DC

## 2020-10-14 MED ORDER — POLYVINYL ALCOHOL 1.4 % OP SOLN
1.0000 [drp] | Freq: Four times a day (QID) | OPHTHALMIC | Status: DC | PRN
  Filled 2020-10-14: qty 15

## 2020-10-14 MED ORDER — HALOPERIDOL LACTATE 5 MG/ML IJ SOLN: 2.0000 mg | INTRAMUSCULAR | Status: DC | PRN

## 2020-10-14 MED ORDER — METRONIDAZOLE IN NACL 5-0.79 MG/ML-% IV SOLN
500.0000 mg | Freq: Three times a day (TID) | INTRAVENOUS | Status: DC
  Administered 2020-10-14: 09:00:00 500 mg via INTRAVENOUS
  Filled 2020-10-14: qty 100

## 2020-10-14 MED ORDER — LACTATED RINGERS IV BOLUS
500.0000 mL | Freq: Once | INTRAVENOUS | Status: AC
  Administered 2020-10-14: 14:00:00 500 mL via INTRAVENOUS

## 2020-10-14 MED ORDER — GLYCOPYRROLATE 1 MG PO TABS
1.0000 mg | ORAL_TABLET | ORAL | Status: DC | PRN
  Filled 2020-10-14: qty 1

## 2020-10-14 MED ORDER — DIVALPROEX SODIUM 125 MG PO CSDR
125.0000 mg | DELAYED_RELEASE_CAPSULE | Freq: Two times a day (BID) | ORAL | Status: DC
  Administered 2020-10-14: 09:00:00 125 mg via ORAL
  Filled 2020-10-14 (×4): qty 1

## 2020-10-14 MED ORDER — METRONIDAZOLE IN NACL 5-0.79 MG/ML-% IV SOLN
500.0000 mg | Freq: Once | INTRAVENOUS | Status: AC
  Administered 2020-10-14: 02:00:00 500 mg via INTRAVENOUS
  Filled 2020-10-14: qty 100

## 2020-10-14 MED ORDER — SODIUM CHLORIDE 0.9 % IV BOLUS: 500.0000 mL | Freq: Once | INTRAVENOUS | Status: DC

## 2020-10-14 MED ORDER — GLYCOPYRROLATE 0.2 MG/ML IJ SOLN
0.2000 mg | INTRAMUSCULAR | Status: DC | PRN
  Filled 2020-10-14: qty 1

## 2020-10-14 MED ORDER — ONDANSETRON HCL 4 MG/2ML IJ SOLN: 4.0000 mg | Freq: Once | INTRAMUSCULAR | Status: AC

## 2020-10-14 MED ORDER — HALOPERIDOL LACTATE 2 MG/ML PO CONC
0.5000 mg | ORAL | Status: DC | PRN
Start: 2020-10-14 — End: 2020-10-14
  Filled 2020-10-14: qty 0.3

## 2020-10-14 MED ORDER — LACTATED RINGERS IV BOLUS (SEPSIS)
1000.0000 mL | Freq: Once | INTRAVENOUS | Status: AC
  Administered 2020-10-14: 01:00:00 1000 mL via INTRAVENOUS

## 2020-10-14 MED ORDER — BISACODYL 5 MG PO TBEC: 5.0000 mg | DELAYED_RELEASE_TABLET | Freq: Every day | ORAL | Status: DC | PRN

## 2020-10-14 MED ORDER — HALOPERIDOL LACTATE 2 MG/ML PO CONC
2.0000 mg | ORAL | Status: DC | PRN
  Filled 2020-10-14: qty 1

## 2020-10-14 MED ORDER — INSULIN GLARGINE 100 UNIT/ML ~~LOC~~ SOLN
7.0000 [IU] | Freq: Every day | SUBCUTANEOUS | Status: DC
  Filled 2020-10-14: qty 0.07

## 2020-10-14 MED ORDER — VANCOMYCIN HCL 1500 MG/300ML IV SOLN
1500.0000 mg | Freq: Once | INTRAVENOUS | Status: AC
  Administered 2020-10-14: 02:00:00 1500 mg via INTRAVENOUS
  Filled 2020-10-14: qty 300

## 2020-10-14 MED ORDER — ONDANSETRON HCL 4 MG/2ML IJ SOLN: 4.0000 mg | Freq: Four times a day (QID) | INTRAMUSCULAR | Status: DC | PRN

## 2020-10-14 NOTE — Consult Note (Addendum)
Subjective:   CC: gastric pneumatosis  HPI:  James Hicks is a 74 y.o. male who was consulted by Karma Greaser for issue above.  Hx obtained from wife at bedside.  Recently admitted for same issue, initially presenting as pain.  This time, he had episodes of emesis that prompted return to ED. Per wife, patient has NOT been reporting any pain since discharge, and seemed to be tolerating diet until the episodes of emesis.  She specifically states he is less responsive than baseline as well.   Past Medical History:  has a past medical history of Cholelithiasis, Chronic renal failure, Colon polyps, CVA (cerebral infarction), Degenerative joint disease, Diabetes mellitus without complication ([place]), Diabetic neuropathy ([place]), Hydronephrosis of right kidney, Hyperlipidemia, Hypertension, Renal cell carcinoma ([place]), and Stroke ([place]).  Past Surgical History:  Past Surgical History:  Procedure Laterality Date  . CATARACT EXTRACTION    . CHOLECYSTECTOMY    . COLONOSCOPY    . COLONOSCOPY WITH PROPOFOL N/A 05/28/2018   Procedure: COLONOSCOPY WITH PROPOFOL;  Surgeon: Toledo, Benay Pike, MD;  Location: [facility] ENDOSCOPY;  Service: Gastroenterology;  Laterality: N/A;  . KIDNEY TRANSPLANT    . KNEE ARTHROSCOPY    . NEPHRECTOMY    . TONSILLECTOMY    . WISDOM TOOTH EXTRACTION      Family History: family history includes Breast cancer in his sister; Colon cancer in his mother; Diabetes in his mother.  Social History:  reports that he has never smoked. He has never used smokeless tobacco. He reports previous alcohol use. He reports previous drug use.  Current Medications:  Prior to Admission medications   Medication Sig Start Date End Date Taking? Authorizing Provider  allopurinol (ZYLOPRIM) 100 MG tablet Take 100 mg by mouth daily.    [provider]  atorvastatin (LIPITOR) 10 MG tablet Take 10 mg by mouth daily.    [provider]  calcium-vitamin D (OSCAL WITH D) 500-200 MG-UNIT tablet Take 1  tablet by mouth 2 (two) times daily.    [provider]  DEPAKOTE SPRINKLES 125 MG capsule Take 125 mg by mouth 2 (two) times daily. 09/26/20   [provider]  diclofenac Sodium (VOLTAREN) 1 % GEL Apply 2 g topically 4 (four) times daily. To right shoulder and lower back    [provider]  DULCOLAX 5 MG EC tablet Take 5 mg by mouth 2 (two) times daily as needed. 09/26/20   [provider]  FLOMAX 0.4 MG CAPS capsule Take 0.8 mg by mouth at bedtime. 09/26/20   [provider]  insulin aspart (NOVOLOG) 100 UNIT/ML injection Inject 1-7 Units into the skin 4 (four) times daily. Per Sliding Scale ( 150-200: 1u, 201-249: 2u, 250-299: 3u, 300-349: 4u, 350-399: 5u, If greater than 399: 7u. 09/26/20   [provider]  insulin glargine (LANTUS) 100 UNIT/ML injection Inject 0.07 mLs (7 Units total) into the skin at bedtime. 10/11/20   Wieting, Richard, MD  MELATONIN MAXIMUM STRENGTH 5 MG TABS Take 5 mg by mouth at bedtime. 09/26/20   [provider]  metoprolol succinate (TOPROL-XL) 25 MG 24 hr tablet Take 25 mg by mouth daily. 07/31/18   [provider]  mycophenolate (CELLCEPT) 500 MG tablet Take 500 mg by mouth 2 (two) times daily.    [provider]  pantoprazole (PROTONIX) 40 MG tablet Take 1 tablet (40 mg total) by mouth daily. 10/11/20 10/11/21  Loletha Grayer, MD  polyethylene glycol (MIRALAX / GLYCOLAX) 17 g packet Take 17 g by  mouth daily as needed for moderate constipation. 10/11/20   Loletha Grayer, MD  predniSONE (DELTASONE) 10 MG tablet Take 1 tablet (10 mg total) by mouth daily. 07/05/19   Cuthriell, Charline Bills, PA-C  SENNA PLUS 8.6-50 MG tablet Take 1 tablet by mouth daily. 09/26/20   [provider]  SEROQUEL 50 MG tablet Take 100 mg by mouth at bedtime. 09/26/20   [provider]  tacrolimus (PROGRAF) 1 MG capsule Take 5 mg by mouth daily. 0900    [provider]  tacrolimus (PROGRAF)  1 MG capsule Take 6 mg by mouth at bedtime. 2100    [provider]  traZODone (DESYREL) 50 MG tablet Take 100 mg by mouth at bedtime. 09/26/20   [provider]  TYLENOL 325 MG tablet Take 650 mg by mouth every 6 (six) hours as needed. 09/26/20   [provider]  colchicine 0.6 MG tablet Take 0.6 mg by mouth daily. Patient not taking: Reported on 10/06/2020  10/07/20  [provider]  enalapril (VASOTEC) 20 MG tablet Take 20 mg by mouth daily. Patient not taking: Reported on 10/06/2020  10/07/20  [provider]  furosemide (LASIX) 20 MG tablet Take 20 mg by mouth 2 (two) times daily. Patient not taking: Reported on 10/06/2020  10/07/20  [provider]    Allergies:  Allergies as of 10/13/2020 - Review Complete 10/13/2020  Allergen Reaction Noted  . Hylan g-f 20 Anaphylaxis 03/05/2014  . Latex Hives 04/22/2013  . Other Anaphylaxis and Other (See Comments) 10/23/2015    ROS:  Unable to obtain secondary to patient mental status   Objective:     BP (!) 162/92   Pulse 70   Temp (!) 96.6 F (35.9 C) (Rectal)   Resp (!) 21   SpO2 99%   Constitutional :  minimally responsive to verbal and pain,  minimal eye opening with firm sternal rub  Lymphatics/Throat:  no asymmetry, masses, or scars  Respiratory:  clear to auscultation bilaterally  Cardiovascular:  regular rate and rhythm  Gastrointestinal: soft, non-tender; bowel sounds normal; no masses,  no organomegaly. Palpable kidney within LLQ  Musculoskeletal: In bed  Skin: Cool and moist,   Psychiatric: Normal affect, non-agitated, not confused       LABS:  CMP Latest Ref Rng & Units 10/13/2020 10/09/2020 10/08/2020  Glucose 70 - 99 mg/dL 258(H) 133(H) 88  BUN 8 - 23 mg/dL 18 29(H) 35(H)  Creatinine 0.61 - 1.24 mg/dL 0.88 0.91 0.92  Sodium 135 - 145 mmol/L 134(L) 144 142  Potassium 3.5 - 5.1 mmol/L 3.9 3.7 3.7  Chloride 98 - 111 mmol/L 100 109 107  CO2 22 - 32 mmol/L 25 26  25   Calcium 8.9 - 10.3 mg/dL 9.0 8.9 9.1  Total Protein 6.5 - 8.1 g/dL 6.7 - -  Total Bilirubin 0.3 - 1.2 mg/dL 0.5 - -  Alkaline Phos 38 - 126 U/L 54 - -  AST 15 - 41 U/L 15 - -  ALT 0 - 44 U/L 13 - -   CBC Latest Ref Rng & Units 10/13/2020 10/09/2020 10/08/2020  WBC 4.0 - 10.5 K/uL 7.1 6.7 11.4(H)  Hemoglobin 13.0 - 17.0 g/dL 12.5(L) 10.2(L) 9.8(L)  Hematocrit 39.0 - 52.0 % 40.0 33.2(L) 32.1(L)  Platelets 150 - 400 K/uL 197 237 232    RADS: CLINICAL DATA:  Nausea, vomiting.  EXAM: CT CHEST, ABDOMEN, AND PELVIS WITH CONTRAST  TECHNIQUE: Multidetector CT imaging of the chest, abdomen and pelvis was performed following  the standard protocol during bolus administration of intravenous contrast.  CONTRAST:  90mL OMNIPAQUE IOHEXOL 300 MG/ML  SOLN  COMPARISON:  10/06/2020  FINDINGS: CT CHEST FINDINGS  Cardiovascular: Coronary artery and aortic calcifications. Heart is normal size. Aorta is normal caliber.  Mediastinum/Nodes: No mediastinal, hilar, or axillary adenopathy. Trachea and esophagus are unremarkable. Thyroid unremarkable. Previously seen pneumatosis around the distal esophagus has decreased. A small amount pneumatosis now present.  Lungs/Pleura: Small bilateral pleural effusions. Bibasilar atelectasis.  Musculoskeletal: Chest wall soft tissues are unremarkable. No acute bony abnormality.  CT ABDOMEN PELVIS FINDINGS  Hepatobiliary: Extensive portal venous gas throughout the liver. No focal hepatic abnormality. Prior cholecystectomy.  Pancreas: No focal abnormality or ductal dilatation.  Spleen: No focal abnormality.  Normal size.  Adrenals/Urinary Tract: Bilateral nephrectomy. Adrenal glands unremarkable. Left lower quadrant renal transplant. No hydronephrosis. Urinary bladder unremarkable.  Stomach/Bowel: There is pneumatosis throughout the wall of the stomach which is markedly distended with gas and fluid. Large and small bowel loops  grossly unremarkable. Mildly prominent scattered small bowel loops without transition, wall thickening or pneumatosis. Favor ileus.  Vascular/Lymphatic: Heavily calcified aorta. No evidence of aneurysm or adenopathy.  Reproductive: No visible focal abnormality.  Other: Moderate amount of free fluid in the left abdomen and pelvis. No free air.  Musculoskeletal: No acute bony abnormality.  IMPRESSION: Extensive gastric pneumatosis which also extends superiorly along the distal esophagus. Associated extensive portal venous gas throughout the liver. Findings concerning for ischemic bowel. Stomach is distended with gas and fluid/debris.  Moderate free fluid in the left abdomen and pelvis.  Small bilateral pleural effusions, bibasilar atelectasis.  Coronary artery disease, aortic atherosclerosis.  Critical Value/emergent results were called by telephone at the time of interpretation on 10/14/2020 at 1:49 am to provider [facility] , who verbally acknowledged these results.   Electronically Signed   By: Rolm Baptise M.D.   On: 10/14/2020 01:51  Assessment:   N/V, with gastric distention and gastric and portal pneumotasis Hx of kidney transplant CVA on plavix   Plan:   Still unclear etiology, with completely benign abdominal exam with no involuntary guarding.  Unsure how reliable an exam it can be due to current mentation, but if severe enough, some guarding should be noted.  Concern for more of an infectious etiology rather than a vascular issue due to the abundant vascularity of the stomach.  Per wife, she believes mentation is worse, although previous notes states this maybe baseline.  With the possible AMS, hypothermia, and tachypnea, he does meet sepsis criteria so agree with current management of abx, IVF, supportive care.  With the new onset nausea and vomiting that was not reported previously, 1 additional recommendation will be to place an NG tube for  this admission.  I did talk to the wife about the increased risk for aspiration, but at this point the benefits outweigh the small but present risk.  NG tube placed by myself and mucus like discharge, approximately 21ml aspirated, with air auscultated in stomach.  Pending KUB.  He did wake up and was resisting the tube placement initially, so mittens placed.  Patient sister over the phone also requested we reach out to his transplant team to see if there is any additional suggestions reached out to the coordinator and she stated that she can pass the word along to the surgeon to see if there is any additional recommendations.  Due to the current bed shortage, they nor do we believe that a transfer is necessary at  this point.

## 2020-10-14 NOTE — ED Notes (Signed)
Patient visualized resting. Rise and fall of chest noted. Patient placed on monitor temporarily to obtain VS.  Awaiting to call report to floor.

## 2020-10-14 NOTE — ED Triage Notes (Signed)
[facility]/o N/V that started today. Pt has hx of bowel obstructions and was recently hospitalized for this problem. Pt has hx of Dementia as well and is unable to answer questions and follow commands at this time. Pts wife at bedside and states that pt is at baseline cognition.

## 2020-10-14 NOTE — Progress Notes (Addendum)
  PROGRESS NOTE    James Hicks  DHW:[id number] DOB: 1946-04-26 DOA: 10/13/2020  PCP: Derinda Late, MD    LOS - 0   ______________________________________________________________________________  Attending MD Note:  I have seen and examined the patient with NP student and agree with the note below which has been edited to reflect our agreed upon history, exam, and assessment/plan.    I have personally reviewed the orders for the patient, which were made under my direction.     Patient seen this afternoon.  He is unresponsive to hard sternal rub and voice, diaphoretic.  Does not appear in acute distress.  Discussed with palliative care and they plan to discuss possible transition to comfort care this afternoon with wife and other family members.  Patient is appropriate for hospice and/or comfort care measures.  Continue broad spectrum antibiotics for now.   James Slocumb, DO [facility] ______________________________________________________________________________   74yo bed bound male with PMH significant for advanced dementia, right subdural hematoma, h/o renal transplant on immunosuppressive therapy, h/o SBO, IDT2DM, HTN, HLD presents from SNF with N/Non-bloody emesis x a few hours. Had similar occurrence a week prior. Patient admitted to obs with N/V associated with likely metabolic encephalopathy. Meeting criteria for sepsis so sepsis protocol was initiated. Patient is DNR and spouse would like to discuss palliative measures.   Interval subjective: patient is now off his bear hugger and maintaining a regular temperature. He is not alert and does not respond to verbal or phsyical stimuli. His wife is at bedside and interested in discussing comfort care with the palliative team. She would like to treat his current condition to it's fullest to know about his recovery. At baseline, he is alert but has advanced dementia. He is non-ambulatory and does not hold meaningful  conversations but he can occasionally recognize her.  Exam:  General: NAD, laying with eyes closed, occasional dry cough Cardiac: RRR, normal heart sounds, no murmurs. 2+ radial and PT pulses bilaterally Respiratory: CTAB, normal effort, No wheezes, rales or rhonchi Abdomen: soft, nontender, nondistended, no hepatic or splenomegaly, +BS Extremities: no edema. WWP. Skin: warm and clammy. No obvious source of infection Neuro: not-alert, does not respond to verbal or painful stimuli  Active Problems:   Sepsis ([place])   I have reviewed the full H&P by Dr. Tobie Hicks in detail, and I agree with the assessment and plan as outlined therein. In addition: Spoke with wife at bedside as above.  Sepsis- patient has positive urinalysis as possible source and suspect contributions either GI or pulmonary as well. He has shown some improvement overnight with downtrending LA and able to maintain body temperature. Vitals are stable. Will continue to monitor for improvements on treatment. - continue broad spectrum antibiotics, follow up on blood and urine cultures - Iv LR bolus and maintenance fluids- patient has no known h/o CHF. Monitor fluid status frequently and adjust fluid rate accordingly. - am CBC, BMP - follow up palliative conversation with family   No Charge    James Osmond, DO PGY-3 FM resident    [facility]   If 7PM-7AM, please contact night-coverage [email or web address] 10/14/2020, 8:05 AM

## 2020-10-14 NOTE — ED Notes (Signed)
Admission MD at bedside.  

## 2020-10-14 NOTE — Progress Notes (Signed)
[facility] Liaison note:  New referral for family interest in [facility] received from Palliative NP Kathie Rhodes. [place]  Notified.  Writer met with patient's wife Barbaraann Share to initiate education regarding hospice services, philosophy, team approach to care and current visitation policy with understanding voiced. Questions answered.  Mrs. Haq and the hospital care team made aware that [facility] does not have bed availability today.  The weekend RN at the hospice home will contact the weekend TOC to notify if bed becomes available.  Thank you for the opportunity to be involved in the care of this patient and his family. Flo Shanks BSN RN, [place] [phone]

## 2020-10-14 NOTE — Progress Notes (Signed)
Subjective:  CC: James Hicks is a 74 y.o. male  Hospital stay day 0,   portal venous gas and gastric pneumotosis  HPI: No acute changes reported since previous exam. Pt not responding to questions appropriately but grimace with NG manipulation.  ROS:  Unable to obtain secondary to patient status  Objective:   Temp:  [96.6 F (35.9 C)-97.7 F (36.5 C)] 97.7 F (36.5 C) (12/17 0554) Pulse Rate:  [68-92] 78 (12/17 1300) Resp:  [11-27] 11 (12/17 1300) BP: (108-163)/(48-92) 137/68 (12/17 1300) SpO2:  [98 %-100 %] 98 % (12/17 1300)             Intake/Output this shift:   Intake/Output Summary (Last 24 hours) at 10/14/2020 1519 Last data filed at 10/14/2020 0437 Gross per 24 hour  Intake 302.24 ml  Output 200 ml  Net 102.24 ml    Constitutional :  altered, unable to answer questions, diaphoretic now  Respiratory:  clear to auscultation bilaterally  Cardiovascular:  regular rate and rhythm  Gastrointestinal: soft, non-tender; bowel sounds normal; no masses,  no organomegaly.   Skin: Cool and moist.   Psychiatric: Normal affect, non-agitated, not confused       LABS:  CMP Latest Ref Rng & Units 10/13/2020 10/09/2020 10/08/2020  Glucose 70 - 99 mg/dL 258(H) 133(H) 88  BUN 8 - 23 mg/dL 18 29(H) 35(H)  Creatinine 0.61 - 1.24 mg/dL 0.88 0.91 0.92  Sodium 135 - 145 mmol/L 134(L) 144 142  Potassium 3.5 - 5.1 mmol/L 3.9 3.7 3.7  Chloride 98 - 111 mmol/L 100 109 107  CO2 22 - 32 mmol/L 25 26 25   Calcium 8.9 - 10.3 mg/dL 9.0 8.9 9.1  Total Protein 6.5 - 8.1 g/dL 6.7 - -  Total Bilirubin 0.3 - 1.2 mg/dL 0.5 - -  Alkaline Phos 38 - 126 U/L 54 - -  AST 15 - 41 U/L 15 - -  ALT 0 - 44 U/L 13 - -   CBC Latest Ref Rng & Units 10/13/2020 10/09/2020 10/08/2020  WBC 4.0 - 10.5 K/uL 7.1 6.7 11.4(H)  Hemoglobin 13.0 - 17.0 g/dL 12.5(L) 10.2(L) 9.8(L)  Hematocrit 39.0 - 52.0 % 40.0 33.2(L) 32.1(L)  Platelets 150 - 400 K/uL 197 237 232    RADS: n/a Assessment:   N/V, with gastric  distention and gastric and portal pneumotasis Hx of kidney transplant CVA on plavix  No change in clinical exam since initial consult.  No family at bedside.  Chart review noted discussion of possible palliative care.  Any surgical intervention will likely be low yield, since if diagnostic laparoscopy confirms any bowel compromise, proceeding with any resection will have increased risk of complications, likely very little advancement in quality of life based on his overall health status.  Abdominal exam remains benign.  Recommended further care per hospitalist team.  Surgery to sign off.  Please call with any additional questions or concerns.  >56min spent on total care, including chart review, patient exam

## 2020-10-14 NOTE — Consult Note (Signed)
Consultation Note Date: 10/14/2020   Patient Name: James Hicks  DOB: Aug 06, 1946  MRN: [id number]  Age / Sex: 74 y.o., male  PCP: Derinda Late, MD Referring Physician: [name], DO  Reason for Consultation: Establishing goals of care  HPI/Patient Profile: 74 y.o. 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 Was admitted 1 week prior for bowel obstruction.  CT revealed extensive gastric pneumatosis extending superiorly along the distal esophagus with associated extensive portal gas throughout the liver and concerning for ischemic bowel. Also with sepsis - etiology unclear. PMT consulted to discuss [place].   Clinical Assessment and Goals of Care: I have reviewed medical records including EPIC notes, labs and imaging, assessed the patient and then met with patient's spouse, 3 children, and siblings to discuss diagnosis prognosis, GOC, EOL wishes, disposition and options.  I introduced Palliative Medicine as specialized medical care for people living with serious illness. It focuses on providing relief from the symptoms and stress of a serious illness. The goal is to improve quality of life for both the patient and the family.  We discussed a brief life review of the patient. They all share their concerns that patient has had poor quality of life for a while. They specifically speak of a fall he had earlier this year and speak of a drastic decline since that time.  As far as functional, patient was nonambulatory. They tell me patient was mostly nonverbal - when he did speak his words did not make sense.   We discussed patient's current illness and what it means in the larger context of patient's on-going co-morbidities.  Natural disease trajectory and expectations at EOL were  discussed. Discussed his gastric pneumatosis and GI symptoms. Discussed possible infectious process. Discussed functional and cognitive decline. Discussed frail baseline and likely new, more frail baseline following another significant event with hospitalization.   I attempted to elicit values and goals of care important to the patient.  Family shares that patient has made statements in the past to different family members about not wanting to continue on.  The difference between aggressive medical intervention and comfort care was considered in light of the patient's goals of care. Discussed what aggressive interventions would look life for Mr. Balling with continued IV antibiotics and medical work up. Discussed proceeding with this may give [place] more time but will likely not improve quality of life. Discussed expected need for repeat hospitalization given his frailty and multiple comorbidities. We also discussed a comfort path - using medications to treat his symptoms and avoiding any interventions that cause discomfort. Allowing the natural disease process to occur and support his body through it ensuring he does not suffer. After much discussion about these options all family members agree that comfort care is most appropriate.  Advance directives, concepts specific to code status, artificial feeding and hydration, and rehospitalization were considered and discussed. Confirmed DNR status - all family agrees.   Hospice services outpatient were explained and offered. Family interested in hospice facility placement. Discussed philosophy of hospice care.   Questions and concerns were addressed. The family was encouraged to call with questions or concerns.   Primary Decision Maker NEXT OF KIN - wife Yanis Larin    SUMMARY OF RECOMMENDATIONS   - comfort measures only, remove NG - PRN medications to ensure comfort - transfer to hospice facility when  bed available  Code Status/Advance Care  Planning:  DNR  Additional Recommendations (Limitations, Scope, Preferences):  Full Comfort Care  Psycho-social/Spiritual:   Desire for further Chaplaincy support:yes  Additional Recommendations: Education on Hospice  Prognosis:   < 2 weeks  Discharge Planning: Hospice facility      Primary Diagnoses: Present on Admission: . Sepsis ([place])   I have reviewed the medical record, interviewed the patient and family, and examined the patient. The following aspects are pertinent.  Past Medical History:  Diagnosis Date  . Cholelithiasis   . Chronic renal failure    kidney transplant   . Colon polyps   . CVA (cerebral infarction)   . Degenerative joint disease   . Diabetes mellitus without complication ([place])   . Diabetic neuropathy ([place])   . Hydronephrosis of right kidney   . Hyperlipidemia   . Hypertension   . Renal cell carcinoma ([place])   . Stroke [facility])    Social History   Socioeconomic History  . Marital status: Married    Spouse name: Not on file  . Number of children: Not on file  . Years of education: Not on file  . Highest education level: Not on file  Occupational History  . Not on file  Tobacco Use  . Smoking status: Never Smoker  . Smokeless tobacco: Never Used  Vaping Use  . Vaping Use: Never used  Substance and Sexual Activity  . Alcohol use: Not Currently  . Drug use: Not Currently  . Sexual activity: Not on file    Comment: Married   Other Topics Concern  . Not on file  Social History Narrative  . Not on file   Social Determinants of Health   Financial Resource Strain: Not on file  Food Insecurity: Not on file  Transportation Needs: Not on file  Physical Activity: Not on file  Stress: Not on file  Social Connections: Not on file   Family History  Problem Relation Age of Onset  . Colon cancer Mother   . Diabetes Mother   . Breast cancer Sister    Scheduled Meds: . divalproex  125 mg Oral BID  . enoxaparin (LOVENOX) injection  40  mg Subcutaneous Q24H  . insulin glargine  7 Units Subcutaneous QHS  . metoprolol succinate  25 mg Oral Daily  . mycophenolate  500 mg Oral BID  . predniSONE  10 mg Oral Daily  . QUEtiapine  100 mg Oral QHS  . tacrolimus  5 mg Oral Daily  . tacrolimus  6 mg Oral QHS  . traZODone  100 mg Oral QHS   Continuous Infusions: . ceFEPime (MAXIPIME) IV Stopped (10/14/20 1020)  . metronidazole Stopped (10/14/20 1035)  . vancomycin     PRN Meds:.acetaminophen **OR** acetaminophen, bisacodyl, morphine injection, ondansetron **OR** ondansetron (ZOFRAN) IV Allergies  Allergen Reactions  . Hylan G-F 20 Anaphylaxis    Other reaction(s): Other (See Comments), Unknown Other reaction(s): Unknown Other reaction(s): Unknown Other reaction(s): Unknown   . Latex Hives  . Other Anaphylaxis and Other (See Comments)    IBAND IBAND IBAND IBAND    Review of Systems  Unable to perform ROS: Patient nonverbal    Physical Exam Constitutional:      General: He is not in acute distress.    Comments: Lethargic frail  Pulmonary:     Effort: Pulmonary effort is normal.  Musculoskeletal:     Right lower leg: No edema.     Left lower leg: No edema.  Skin:  General: Skin is warm and dry.     Vital Signs: BP 125/76   Pulse 92   Temp 97.7 F (36.5 C) (Oral)   Resp (!) 22   SpO2 98%      Pain Score: 0-No pain   SpO2: SpO2: 98 % O2 Device:SpO2: 98 % O2 Flow Rate: .   IO: Intake/output summary:   Intake/Output Summary (Last 24 hours) at 10/14/2020 1218 Last data filed at 10/14/2020 4591 Gross per 24 hour  Intake 302.24 ml  Output 200 ml  Net 102.24 ml    LBM:   Baseline Weight:   Most recent weight:       Palliative Assessment/Data: PPS 10%    Time Total: 130 minutes Greater than 50%  of this time was spent counseling and coordinating care related to the above assessment and plan.  Juel Burrow, DNP, AGNP-C Palliative Medicine Team [phone] Pager: [phone]

## 2020-10-14 NOTE — ED Notes (Signed)
MD at bedside. 

## 2020-10-14 NOTE — ED Notes (Signed)
Surgeon verbalized the NG placement is appropriate, no need to adjust.

## 2020-10-14 NOTE — H&P (Signed)
History and Physical   DEYLAN CANTERBURY QQV:[id number] DOB: [birth year] DOA: 10/13/2020  PCP: Derinda Late, MD  Outpatient Specialists: [facility] health kidney transplant Patient coming from: SNF  I have personally briefly reviewed patient's old medical records in [place].  Chief Concern: Nausea and vomiting  HPI: James Hicks is a 74 y.o. 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  Spouse at bedside reports nausea and vomiting started around 8/9 pm on 10/13/20.  Reports that patient has been tolerating p.o. intake up to moment of vomiting at SNF.  She denies reported bloody vomitus from SNF staff.  She denies fever, increased cough, complaints of chest pain, shortness of breath.  She reports that he is less responsive than baseline.  At bedside, patient is minimally responsive, cachectic and frail.  He is maintaining airway and breathing on his own.  He mumbles with sternal rub.  He is not actively vomiting at this time. He presented with rectal temperature of 96.6 F.  He has a bear hugger blanket in place.  Social history: currently resides at SNF and formerly lived at home with spouse.  Married for 29 years.  He is retired and formerly worked at [employer] as a Freight forwarder.  Spouse denies patient history of tobacco, EtOH, recreational drug use.  ED Course: Discussed with ED provider, requesting admission for sepsis with hypothermia. Vitals in the emergency department was concerning for hypothermia, 96.6 Fahrenheit rectal, elevated respiration rate from 17-27, sinus rhythm heart rate, blood pressure maintaining MAP and satting at greater than 95% on room air.  Review of Systems: As per HPI otherwise 10 point review of systems negative.    Assessment/Plan  Active Problems:   Sepsis ([place])   Extensive gastric pneumatosis extending superiorly along the distal esophagus with associated extensive portal gas throughout the liver Radiologic image concerning for ischemic bowel -General surgeon, Dr. Lysle Pearl, has been consulted by ED provider -No recommendations for procedure at this time -Admit to progressive cardiac care  Sepsis-etiology unclear -Continue broad-spectrum antibiotics: Cefepime, vancomycin, cefepime and vancomycin per pharmacy, metronidazole -Patient is maintaining MAP therefore IVF is not indicated currently -Blood cultures x2 obtained and pending -Admit to progressive cardiac care  Patient is appropriate for comfort care -A.m. team to consult palliative -Extensive discussion with spouse and sister, Leonte Horrigan who is a Marine scientist by profession regarding my recommendations for comfort care -Spouse wants to pursue broad-spectrum antibiotics and supportive care at this time due to prolonging time so that family can come in to visit patient -Spouse would like comfort care either inpatient and/or at a hospice house as she is not able to care for him on her own  Insulin-dependent diabetes mellitus-resumed home glargine 7 units subcutaneous  Renal transplant and immunosuppressed-resumed tacrolimus and prednisone 10 mg Chart reviewed.   As needed medications: Acetaminophen, ondansetron, morphine  DVT prophylaxis: Enoxaparin Code Status: DNR Diet: Regular as tolerated Family Communication: Extensive discussion with spouse and sister, Wells Guiles Disposition Plan: Pending clinical course, likely to hospice house for comfort care measures Consults called: General surgery, a.m. team to consult palliative Admission status: Observation progressive cardiac  Past Medical History:  Diagnosis Date  . Cholelithiasis   . Chronic renal failure    kidney transplant   . Colon polyps   . CVA (cerebral infarction)   .  Degenerative joint  disease   . Diabetes mellitus without complication ([place])   . Diabetic neuropathy ([place])   . Hydronephrosis of right kidney   . Hyperlipidemia   . Hypertension   . Renal cell carcinoma ([place])   . Stroke [facility])    Past Surgical History:  Procedure Laterality Date  . CATARACT EXTRACTION    . CHOLECYSTECTOMY    . COLONOSCOPY    . COLONOSCOPY WITH PROPOFOL N/A 05/28/2018   Procedure: COLONOSCOPY WITH PROPOFOL;  Surgeon: Toledo, Benay Pike, MD;  Location: [facility] ENDOSCOPY;  Service: Gastroenterology;  Laterality: N/A;  . KIDNEY TRANSPLANT    . KNEE ARTHROSCOPY    . NEPHRECTOMY    . TONSILLECTOMY    . WISDOM TOOTH EXTRACTION     Social History:  reports that he has never smoked. He has never used smokeless tobacco. He reports previous alcohol use. He reports previous drug use.  Allergies  Allergen Reactions  . Hylan G-F 20 Anaphylaxis    Other reaction(s): Other (See Comments), Unknown Other reaction(s): Unknown Other reaction(s): Unknown Other reaction(s): Unknown   . Latex Hives  . Other Anaphylaxis and Other (See Comments)    IBAND IBAND IBAND IBAND    Family History  Problem Relation Age of Onset  . Colon cancer Mother   . Diabetes Mother   . Breast cancer Sister    Family history: Family history reviewed and not pertinent  Prior to Admission medications   Medication Sig Start Date End Date Taking? Authorizing Provider  allopurinol (ZYLOPRIM) 100 MG tablet Take 100 mg by mouth daily.    [provider]  atorvastatin (LIPITOR) 10 MG tablet Take 10 mg by mouth daily.    [provider]  calcium-vitamin D (OSCAL WITH D) 500-200 MG-UNIT tablet Take 1 tablet by mouth 2 (two) times daily.    [provider]  DEPAKOTE SPRINKLES 125 MG capsule Take 125 mg by mouth 2 (two) times daily. 09/26/20   [provider]  diclofenac Sodium (VOLTAREN) 1 % GEL Apply 2 g topically 4 (four) times daily. To right shoulder and lower back     [provider]  DULCOLAX 5 MG EC tablet Take 5 mg by mouth 2 (two) times daily as needed. 09/26/20   [provider]  FLOMAX 0.4 MG CAPS capsule Take 0.8 mg by mouth at bedtime. 09/26/20   [provider]  insulin aspart (NOVOLOG) 100 UNIT/ML injection Inject 1-7 Units into the skin 4 (four) times daily. Per Sliding Scale ( 150-200: 1u, 201-249: 2u, 250-299: 3u, 300-349: 4u, 350-399: 5u, If greater than 399: 7u. 09/26/20   [provider]  insulin glargine (LANTUS) 100 UNIT/ML injection Inject 0.07 mLs (7 Units total) into the skin at bedtime. 10/11/20   Wieting, Richard, MD  MELATONIN MAXIMUM STRENGTH 5 MG TABS Take 5 mg by mouth at bedtime. 09/26/20   [provider]  metoprolol succinate (TOPROL-XL) 25 MG 24 hr tablet Take 25 mg by mouth daily. 07/31/18   [provider]  mycophenolate (CELLCEPT) 500 MG tablet Take 500 mg by mouth 2 (two) times daily.    [provider]  pantoprazole (PROTONIX) 40 MG tablet Take 1 tablet (40 mg total) by mouth daily. 10/11/20 10/11/21  Loletha Grayer, MD  polyethylene glycol (MIRALAX / GLYCOLAX) 17 g packet Take 17 g by mouth daily as needed for moderate constipation. 10/11/20   Loletha Grayer, MD  predniSONE (DELTASONE) 10 MG tablet Take 1 tablet (10 mg total) by mouth daily. 07/05/19  Cuthriell, Charline Bills, PA-C  SENNA PLUS 8.6-50 MG tablet Take 1 tablet by mouth daily. 09/26/20   [provider]  SEROQUEL 50 MG tablet Take 100 mg by mouth at bedtime. 09/26/20   [provider]  tacrolimus (PROGRAF) 1 MG capsule Take 5 mg by mouth daily. 0900    [provider]  tacrolimus (PROGRAF) 1 MG capsule Take 6 mg by mouth at bedtime. 2100    [provider]  traZODone (DESYREL) 50 MG tablet Take 100 mg by mouth at bedtime. 09/26/20   [provider]  TYLENOL 325 MG tablet Take 650 mg by mouth every 6 (six) hours as needed. 09/26/20   [provider]   colchicine 0.6 MG tablet Take 0.6 mg by mouth daily. Patient not taking: Reported on 10/06/2020  10/07/20  [provider]  enalapril (VASOTEC) 20 MG tablet Take 20 mg by mouth daily. Patient not taking: Reported on 10/06/2020  10/07/20  [provider]  furosemide (LASIX) 20 MG tablet Take 20 mg by mouth 2 (two) times daily. Patient not taking: Reported on 10/06/2020  10/07/20  [provider]    Physical Exam: Vitals:   10/14/20 0115 10/14/20 0200 10/14/20 0230 10/14/20 0300  BP:    133/90  Pulse: 74 70 68 77  Resp: (!) 22 (!) 21 (!) 23 (!) 27  Temp:      TempSrc:      SpO2: 100% 99% 100% 98%   Constitutional: appears cachectic and frail, NAD, calm, comfortable Eyes: PERRL, lids and conjunctivae normal ENMT: Mucous membranes are moist. Posterior pharynx clear of any exudate or lesions. Age-appropriate dentition.  Neck: normal, supple, no masses, no thyromegaly Respiratory: clear to auscultation bilaterally, no wheezing, no crackles. Normal respiratory effort. No accessory muscle use.  Cardiovascular: Regular rate and rhythm, no murmurs / rubs / gallops. No extremity edema. 2+ pedal pulses. No carotid bruits.  Abdomen: no tenderness, no masses palpated, no hepatosplenomegaly. Bowel sounds positive.  Musculoskeletal: no clubbing / cyanosis. No joint deformity upper and lower extremities. no contractures.  Bilateral lower extremity atrophy.  Decreased muscle tone diffusely.  Skin: no rashes, lesions, ulcers. No induration Neurologic: Sensation intact. Strength 5/5 in all 4.  Psychiatric: Normal judgment and insight. Alert and oriented x 3. Normal mood.   EKG: Independently reviewed, showing sinus rhythm with rate of 70, QTc 461  Chest x-ray on Admission: Personally reviewed and I agree with radiologist reading as below.  CT CHEST ABDOMEN PELVIS W CONTRAST  Result Date: 10/14/2020 CLINICAL DATA:  Nausea, vomiting. EXAM: CT CHEST, ABDOMEN, AND PELVIS WITH  CONTRAST TECHNIQUE: Multidetector CT imaging of the chest, abdomen and pelvis was performed following the standard protocol during bolus administration of intravenous contrast. CONTRAST:  13mL OMNIPAQUE IOHEXOL 300 MG/ML  SOLN COMPARISON:  10/06/2020 FINDINGS: CT CHEST FINDINGS Cardiovascular: Coronary artery and aortic calcifications. Heart is normal size. Aorta is normal caliber. Mediastinum/Nodes: No mediastinal, hilar, or axillary adenopathy. Trachea and esophagus are unremarkable. Thyroid unremarkable. Previously seen pneumatosis around the distal esophagus has decreased. A small amount pneumatosis now present. Lungs/Pleura: Small bilateral pleural effusions. Bibasilar atelectasis. Musculoskeletal: Chest wall soft tissues are unremarkable. No acute bony abnormality. CT ABDOMEN PELVIS FINDINGS Hepatobiliary: Extensive portal venous gas throughout the liver. No focal hepatic abnormality. Prior cholecystectomy. Pancreas: No focal abnormality or ductal dilatation. Spleen: No focal abnormality.  Normal size. Adrenals/Urinary Tract: Bilateral nephrectomy. Adrenal glands unremarkable. Left lower quadrant renal transplant. No hydronephrosis. Urinary bladder unremarkable. Stomach/Bowel: There is pneumatosis throughout  the wall of the stomach which is markedly distended with gas and fluid. Large and small bowel loops grossly unremarkable. Mildly prominent scattered small bowel loops without transition, wall thickening or pneumatosis. Favor ileus. Vascular/Lymphatic: Heavily calcified aorta. No evidence of aneurysm or adenopathy. Reproductive: No visible focal abnormality. Other: Moderate amount of free fluid in the left abdomen and pelvis. No free air. Musculoskeletal: No acute bony abnormality. IMPRESSION: Extensive gastric pneumatosis which also extends superiorly along the distal esophagus. Associated extensive portal venous gas throughout the liver. Findings concerning for ischemic bowel. Stomach is distended with  gas and fluid/debris. Moderate free fluid in the left abdomen and pelvis. Small bilateral pleural effusions, bibasilar atelectasis. Coronary artery disease, aortic atherosclerosis. Critical Value/emergent results were called by telephone at the time of interpretation on 10/14/2020 at 1:49 am to provider [facility] , who verbally acknowledged these results. Electronically Signed   By: Rolm Baptise M.D.   On: 10/14/2020 01:51   DG Chest Port 1 View  Result Date: 10/13/2020 CLINICAL DATA:  74 year old male with concern for sepsis. EXAM: PORTABLE CHEST 1 VIEW COMPARISON:  Chest radiograph dated 08/13/2018. FINDINGS: Mild diffuse interstitial prominence, likely atelectasis. No focal consolidation, pleural effusion, pneumothorax. The cardiac silhouette is within limits. No acute osseous pathology. Air under the left hemidiaphragm, likely within the stomach. IMPRESSION: No acute cardiopulmonary process. Electronically Signed   By: Anner Crete M.D.   On: 10/13/2020 23:34   Labs on Admission: I have personally reviewed following labs  CBC: Recent Labs  Lab 10/07/20 0906 10/08/20 0343 10/09/20 0655 10/13/20 2359  WBC 18.8* 11.4* 6.7 7.1  NEUTROABS  --   --   --  5.4  HGB 10.9* 9.8* 10.2* 12.5*  HCT 34.0* 32.1* 33.2* 40.0  MCV 83.3 84.9 84.5 83.2  PLT 256 232 237 937   Basic Metabolic Panel: Recent Labs  Lab 10/07/20 0906 10/08/20 0343 10/09/20 0655 10/13/20 2359  NA 139 142 144 134*  K 3.9 3.7 3.7 3.9  CL 100 107 109 100  CO2 30 25 26 25   GLUCOSE 103* 88 133* 258*  BUN 37* 35* 29* 18  CREATININE 0.96 0.92 0.91 0.88  CALCIUM 9.7 9.1 8.9 9.0   GFR: Estimated Creatinine Clearance: 70.2 mL/min (by C-G formula based on SCr of 0.88 mg/dL). Liver Function Tests: Recent Labs  Lab 10/13/20 2359  AST 15  ALT 13  ALKPHOS 54  BILITOT 0.5  PROT 6.7  ALBUMIN 3.1*   Recent Labs  Lab 10/13/20 2359  LIPASE 34   No results for input(s): AMMONIA in the last 168 hours. Coagulation  Profile: Recent Labs  Lab 10/13/20 2359  INR 1.0   CBG: Recent Labs  Lab 10/10/20 1711 10/10/20 2041 10/10/20 2329 10/11/20 0343 10/11/20 0805  GLUCAP 177* 114* 162* 144* 114*   Urine analysis:    Component Value Date/Time   COLORURINE YELLOW (A) 10/13/2020 2359   APPEARANCEUR HAZY (A) 10/13/2020 2359   LABSPEC 1.018 10/13/2020 2359   PHURINE 5.0 10/13/2020 2359   GLUCOSEU >=500 (A) 10/13/2020 2359   HGBUR SMALL (A) 10/13/2020 2359   BILIRUBINUR NEGATIVE 10/13/2020 [place] 10/13/2020 2359   PROTEINUR >=300 (A) 10/13/2020 2359   NITRITE NEGATIVE 10/13/2020 2359   LEUKOCYTESUR MODERATE (A) 10/13/2020 2359   Koy Lamp N Aaban Griep D.O. [facility]  If 7AM-7PM, please contact day coverage provider [email or web address]  10/14/2020, 3:55 AM

## 2020-10-14 NOTE — Progress Notes (Signed)
Pharmacy Antibiotic Note  James Hicks is a 74 y.o. male admitted on 10/13/2020 with sepsis.  Pharmacy has been consulted for Vancomycin, Cefepime  dosing.  Plan: Cefepime 2 gm IV X 1 given in ED on 12/17 @ 0125. Cefepime 2 gm IV Q8H ordered to continue on 12/17 @ 0900.   Vancomycin 1500 mg IV X 1 given in ED @ 0226.  Vancomycin 750 mg IV Q12H ordered to continue on 12/17 @ 1400.      Temp (24hrs), Avg:96.6 F (35.9 C), Min:96.6 F (35.9 C), Max:96.6 F (35.9 C)  Recent Labs  Lab 10/07/20 0906 10/08/20 0343 10/09/20 0655 10/13/20 2359 10/14/20 0154  WBC 18.8* 11.4* 6.7 7.1  --   CREATININE 0.96 0.92 0.91 0.88  --   LATICACIDVEN 0.9  --   --  1.5 1.2    Estimated Creatinine Clearance: 70.2 mL/min (by C-G formula based on SCr of 0.88 mg/dL).    Allergies  Allergen Reactions  . Hylan G-F 20 Anaphylaxis    Other reaction(s): Other (See Comments), Unknown Other reaction(s): Unknown Other reaction(s): Unknown Other reaction(s): Unknown   . Latex Hives  . Other Anaphylaxis and Other (See Comments)    IBAND IBAND IBAND IBAND     Antimicrobials this admission:   >>    >>   Dose adjustments this admission:   Microbiology results:  BCx:   UCx:    Sputum:    MRSA PCR:   Thank you for allowing pharmacy to be a part of this patient's care.  Olivier Frayre D 10/14/2020 4:37 AM

## 2020-10-14 NOTE — Progress Notes (Signed)
CODE SEPSIS - PHARMACY COMMUNICATION  **Broad Spectrum Antibiotics should be administered within 1 hour of Sepsis diagnosis**  Time Code Sepsis Called/Page Received:  12/17 @ 0108  Antibiotics Ordered:  Cefepime , Vanc   Time of 1st antibiotic administration: Cefepime 2 gm IV X 1 on 12/17 @ 0125  Additional action taken by pharmacy:   If necessary, Name of Provider/Nurse Contacted:     Davyn Morandi D ,PharmD Clinical Pharmacist  10/14/2020  2:08 AM

## 2020-10-14 NOTE — Progress Notes (Signed)
PHARMACY -  BRIEF ANTIBIOTIC NOTE   Pharmacy has received consult(s) for Vanc, cefepime from an ED provider.  The patient's profile has been reviewed for ht/wt/allergies/indication/available labs.    One time order(s) placed for Vancomycin 1500 mg IV X 1 and Cefepime 2 gm IV X 1  Further antibiotics/pharmacy consults should be ordered by admitting physician if indicated.                       Thank you, Angelika Jerrett D 10/14/2020  2:10 AM

## 2020-10-15 MED ORDER — ACETAMINOPHEN 325 MG PO TABS: 325.0000 mg | ORAL_TABLET | Freq: Four times a day (QID) | ORAL | Status: AC | PRN

## 2020-10-15 MED ORDER — MORPHINE SULFATE (CONCENTRATE) 10 MG/0.5ML PO SOLN: 5.0000 mg | ORAL | 0 refills | Status: AC | PRN

## 2020-10-15 MED ORDER — HALOPERIDOL 2 MG PO TABS: 2.0000 mg | ORAL_TABLET | ORAL | Status: AC | PRN

## 2020-10-15 MED ORDER — GLYCOPYRROLATE 0.2 MG/ML IJ SOLN: 0.2000 mg | INTRAMUSCULAR | Status: AC | PRN

## 2020-10-15 MED ORDER — ONDANSETRON HCL 4 MG PO TABS: 4.0000 mg | ORAL_TABLET | Freq: Four times a day (QID) | ORAL | 0 refills | Status: AC | PRN

## 2020-10-15 MED ORDER — MORPHINE SULFATE (PF) 4 MG/ML IV SOLN: 4.0000 mg | INTRAVENOUS | 0 refills | Status: AC | PRN

## 2020-10-15 MED ORDER — LORAZEPAM 2 MG/ML PO CONC: 1.0000 mg | ORAL | 0 refills | Status: AC | PRN

## 2020-10-15 MED ORDER — LORAZEPAM 1 MG PO TABS
1.0000 mg | ORAL_TABLET | ORAL | 0 refills | Status: AC | PRN
Start: 2020-10-15 — End: ?

## 2020-10-15 MED ORDER — DULCOLAX 5 MG PO TBEC: 5.0000 mg | DELAYED_RELEASE_TABLET | Freq: Every day | ORAL | 0 refills | Status: AC | PRN

## 2020-10-15 MED ORDER — POLYVINYL ALCOHOL 1.4 % OP SOLN: 1.0000 [drp] | Freq: Four times a day (QID) | OPHTHALMIC | 0 refills | Status: AC | PRN

## 2020-10-15 MED ORDER — LORAZEPAM 2 MG/ML IJ SOLN: 1.0000 mg | INTRAMUSCULAR | 0 refills | Status: AC | PRN

## 2020-10-15 MED ORDER — GLYCOPYRROLATE 1 MG PO TABS: 1.0000 mg | ORAL_TABLET | ORAL | Status: AC | PRN

## 2020-10-15 MED ORDER — ACETAMINOPHEN 325 MG RE SUPP: 325.0000 mg | Freq: Four times a day (QID) | RECTAL | 0 refills | Status: AC | PRN

## 2020-10-15 MED ORDER — HALOPERIDOL LACTATE 5 MG/ML IJ SOLN: 2.0000 mg | INTRAMUSCULAR | Status: AC | PRN

## 2020-10-15 MED ORDER — BIOTENE DRY MOUTH MT LIQD: 15.0000 mL | Freq: Two times a day (BID) | OROMUCOSAL | Status: AC

## 2020-10-15 MED ORDER — ONDANSETRON HCL 4 MG/2ML IJ SOLN: 4.0000 mg | Freq: Four times a day (QID) | INTRAMUSCULAR | 0 refills | Status: AC | PRN

## 2020-10-15 NOTE — Plan of Care (Signed)
  Problem: Education: Goal: Knowledge of the prescribed therapeutic regimen will improve Outcome: Adequate for Discharge   Problem: Coping: Goal: Ability to identify and develop effective coping behavior will improve Outcome: Adequate for Discharge   Problem: Clinical Measurements: Goal: Quality of life will improve Outcome: Adequate for Discharge   Problem: Respiratory: Goal: Verbalizations of increased ease of respirations will increase Outcome: Adequate for Discharge   Problem: Role Relationship: Goal: Family's ability to cope with current situation will improve Outcome: Adequate for Discharge Goal: Ability to verbalize concerns, feelings, and thoughts to partner or family member will improve Outcome: Adequate for Discharge

## 2020-10-15 NOTE — TOC Transition Note (Signed)
Transition of Care [facility]) - CM/SW Discharge Note   Patient Details  Name: James Hicks MRN: [id number] Date of Birth: 1946/10/21  Transition of Care [facility]) CM/SW Contact:  Izola Price, RN Phone Number: 10/15/2020, 1:45 PM   Clinical Narrative:    12/18 1345 Bed became available today at [facility]. Debbie contact at [phone]. Discharge orders faxed to [phone] and receipt confirmed. Spouse notified of transfer. Facility arranging transport. Facesheet and Med Nec. Form printed to unit. Unit RN and provider all in communication. PIV to be left in per Hospice/Debbie. Provider to check DNR yellow sheet for prior to transfer. EMS scheduled around 230. Simmie Davies RN CM    Final next level of care: [place] ([facility]) Barriers to Discharge: Barriers Resolved   Patient Goals and CMS Choice     Choice offered to / list presented to : NA (Family discussed wtih palliative consult.)  Discharge Placement              Patient chooses bed at:  ([facility]) Patient to be transferred to facility by: EMS Name of family member notified: Randal Buba Patient and family notified of of transfer: 10/15/20  Discharge Plan and Services                            [place]:  ([facility])        Social Determinants of Health (SDOH) Interventions     Readmission Risk Interventions Readmission Risk Prevention Plan 10/11/2020  Transportation Screening Complete  PCP or Specialist Appt within 3-5 Days Complete  Social Work Consult for [place] Planning/Counseling [place] Not Applicable  Medication Review [employer] photographer) Complete  Some recent data might be hidden

## 2020-10-15 NOTE — Progress Notes (Signed)
[place] attempted visit per OR and referral from [place] to check on family.  Pt.'s wife on the phone at the time of visit; when [facility] reattempted visit later in the afternoon, pt. had been discharged.

## 2020-10-15 NOTE — Discharge Instructions (Signed)
Dementia Dementia is a condition that affects the way the brain functions. It often affects memory and thinking. Usually, dementia gets worse with time and cannot be reversed (progressive dementia). There are many types of dementia, including:  Alzheimer's disease. This type is the most common.  Vascular dementia. This type may happen as the result of a stroke.  Lewy body dementia. This type may happen to people who have Parkinson's disease.  Frontotemporal dementia. This type is caused by damage to nerve cells (neurons) in certain parts of the brain. Some people may be affected by more than one type of dementia. This is called mixed dementia. What are the causes? Dementia is caused by damage to cells in the brain. The area of the brain and the types of cells damaged determine the type of dementia. Usually, this damage is irreversible or cannot be undone. Some examples of irreversible causes include:  Conditions that affect the blood vessels of the brain, such as diabetes, heart disease, or blood vessel disease.  Genetic mutations. In some cases, changes in the brain may be caused by another condition and can be reversed or slowed. Some examples of reversible causes include:  Injury to the brain.  Certain medicines.  Infection, such as meningitis.  Metabolic problems, such as vitamin B12 deficiency or thyroid disease.  Pressure on the brain, such as from a tumor or blood clot. What are the signs or symptoms? Symptoms of dementia depend on the type of dementia. Common signs of dementia include problems with remembering, thinking, problem solving, decision making, and communicating. These signs develop slowly or get worse with time. This may include:  Problems remembering things.  Having trouble taking a bath or putting clothes on.  Forgetting appointments.  Forgetting to pay bills.  Difficulty planning and preparing meals.  Having trouble speaking.  Getting lost easily. How  is this diagnosed? This condition is diagnosed by a specialist (neurologist). It is diagnosed based on the history of your symptoms, your medical history, a physical exam, and tests. Tests may include:  Tests to evaluate brain function, such as memory tests, cognitive tests, and other tests.  Lab tests, such as blood or urine tests.  Imaging tests, such as a CT scan, a PET scan, or an MRI.  Genetic testing. This may be done if other family members have a diagnosis of certain types of dementia. Your health care provider will talk with you and your family, friends, or caregivers about your history and symptoms. How is this treated?  Treatment for this condition depends on the cause of the dementia. Progressive dementias, such as Alzheimer's disease, cannot be cured, but there may be treatments that help to manage symptoms. Treatment might involve taking medicines that may help to:  Control the dementia.  Slow down the progression of the dementia.  Manage symptoms. In some cases, treating the cause of your dementia can improve symptoms, reverse symptoms, or slow down how quickly your dementia becomes worse. Your health care provider can direct you to support groups, organizations, and other health care providers who can help with decisions about your care. Follow these instructions at home: Medicines  Take over-the-counter and prescription medicines only as told by your health care provider.  Use a pill organizer or pill reminder to help you manage your medicines.  Avoid taking medicines that can affect thinking, such as pain medicines or sleeping medicines. Lifestyle  Make healthy lifestyle choices. ? Be physically active as told by your health care provider. ? Do   not use any products that contain nicotine or tobacco, such as cigarettes, e-cigarettes, and chewing tobacco. If you need help quitting, ask your health care provider. ? Do not drink alcohol. ? Practice stress-management  techniques when you get stressed. ? Spend time with other people.  Make sure to get quality sleep. These tips can help you get a good night's rest: ? Avoid napping during the day. ? Keep your sleeping area dark and cool. ? Avoid exercising during the few hours before you go to bed. ? Avoid caffeine products in the evening. Eating and drinking  Drink enough fluid to keep your urine pale yellow.  Eat a healthy diet. General instructions   Work with your health care provider to determine what you need help with and what your safety needs are.  Talk with your health care provider about whether it is safe for you to drive.  If you were given a bracelet that identifies you as a person with memory loss or tracks your location, make sure to wear it at all times.  Work with your family to make important decisions, such as advance directives, medical power of attorney, or a living will.  Keep all follow-up visits as told by your health care provider. This is important. Where to find more information  Alzheimer's Association: www.alz.org  National Institute on Aging: www.nia.nih.gov/alzheimers  World Health Organization: www.who.int Contact a health care provider if:  You have any new or worsening symptoms.  You have problems with choking or swallowing. Get help right away if:  You feel depressed or sad, or feel that you want to harm yourself.  Your family members become concerned for your safety. If you ever feel like you may hurt yourself or others, or have thoughts about taking your own life, get help right away. You can go to your nearest emergency department or call:  Your local emergency services (911 in the [place]  A suicide crisis helpline, such as the National Suicide Prevention Lifeline at 1-800-273-8255. This is open 24 hours a day. Summary  Dementia is a condition that affects the way the brain functions. Dementia often affects memory and thinking.  Usually,  dementia gets worse with time and cannot be reversed (progressive dementia).  Treatment for this condition depends on the cause of the dementia.  Work with your health care provider to determine what you need help with and what your safety needs are.  Your health care provider can direct you to support groups, organizations, and other health care providers who can help with decisions about your care. This information is not intended to replace advice given to you by your health care provider. Make sure you discuss any questions you have with your health care provider. Document Revised: 12/30/2018 Document Reviewed: 12/30/2018 Elsevier Patient Education  2020 [employer]  

## 2020-10-15 NOTE — Progress Notes (Signed)
Report given to [facility] @hospive  home.  No questions at this time. Family updated and aware.

## 2020-10-15 NOTE — Discharge Summary (Signed)
Physician Discharge Summary  James ROCHFORD HBZ:[id number] DOB: May 12, 1946 DOA: 10/13/2020  PCP: Derinda Late, MD  Admit date: 10/13/2020 Discharge date: 10/15/2020  Admitted From: SNF Disposition:  Hospice   Recommendations for Outpatient Follow-up:  1. Follow up per hospice home team   Home Health: n/a Equipment/Devices: none   Discharge Condition: stable  CODE STATUS: DNR   Diet recommendation: per comfort   Discharge Diagnoses: Active Problems:   Sepsis ([place])    Summary of HPI and Hospital Course:   From H&P by Dr. Tobie Poet: "James Hicks is a 74 y.o. 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  Spouse at bedside reports nausea and vomiting started around 8/9 pm on 10/13/20.  Reports that patient has been tolerating p.o. intake up to moment of vomiting at SNF.  She denies reported bloody vomitus from SNF staff.  She denies fever, increased cough, complaints of chest pain, shortness of breath.  She reports that he is less responsive than baseline.  At bedside, patient is minimally responsive, cachectic and frail.  He is maintaining airway and breathing on his own.  He mumbles with sternal rub.  He is not actively vomiting at this time. He presented with rectal temperature of 96.6 F.  He has a bear hugger blanket in place.  Social history: currently resides at SNF and formerly lived at home with spouse.  Married for 29 years.  He is retired and formerly worked at [employer] as a Freight forwarder.  Spouse denies patient history of tobacco, EtOH, recreational drug use.  ED Course: Discussed with ED provider, requesting admission for sepsis with hypothermia. Vitals in the emergency department was concerning for hypothermia, 96.6  Fahrenheit rectal, elevated respiration rate from 17-27, sinus rhythm heart rate, blood pressure maintaining MAP and satting at greater than 95% on room air."   Admitted for sepsis of uncertain etiology.  Patient was placed on broad spectrum empiric antibiotics while cultures pending.  Family had wanted to give him time to monitor for improvement.   General surgery was consulted and no surgical intervention was required and felt would be much higher risk than any benefit.    Family met with palliative care on 12/17 and made decision to transition to comfort measures only.  Patient is clinically stable for transport to hospice home today.     Discharge Instructions   Discharge Instructions    Call MD for:  severe uncontrolled pain   Complete by: As directed    Diet - low sodium heart healthy   Complete by: As directed    Increase activity slowly   Complete by: As directed      Allergies as of 10/15/2020      Reactions   Hylan G-f 20 Anaphylaxis   Other reaction(s): Other (See Comments), Unknown Other reaction(s): Unknown Other reaction(s): Unknown Other reaction(s): Unknown   Latex Hives   Other Anaphylaxis, Other (See Comments)   IBAND IBAND IBAND IBAND      Medication List    STOP taking these medications   allopurinol 100 MG tablet Commonly known as: ZYLOPRIM   atorvastatin 10 MG tablet Commonly known as: LIPITOR   calcium-vitamin D 500-200 MG-UNIT tablet Commonly known as: OSCAL WITH D   diclofenac Sodium 1 % Gel Commonly known as: VOLTAREN   Flomax 0.4 MG Caps capsule Generic drug:  tamsulosin   insulin aspart 100 UNIT/ML injection Commonly known as: novoLOG   insulin glargine 100 UNIT/ML injection Commonly known as: LANTUS   Melatonin Maximum Strength 5 MG Tabs Generic drug: melatonin   metoprolol succinate 25 MG 24 hr tablet Commonly known as: TOPROL-XL   mycophenolate 500 MG tablet Commonly known as: CELLCEPT   pantoprazole 40 MG  tablet Commonly known as: Protonix   polyethylene glycol 17 g packet Commonly known as: MIRALAX / GLYCOLAX   predniSONE 10 MG tablet Commonly known as: DELTASONE   Senna Plus 8.6-50 MG tablet Generic drug: senna-docusate   tacrolimus 1 MG capsule Commonly known as: PROGRAF     TAKE these medications   acetaminophen 325 MG tablet Commonly known as: TYLENOL Take 1 tablet (325 mg total) by mouth every 6 (six) hours as needed for mild pain, fever or headache (or Fever >/= 101). What changed:   how much to take  reasons to take this   acetaminophen 325 MG suppository Commonly known as: TYLENOL Place 1 suppository (325 mg total) rectally every 6 (six) hours as needed for mild pain or fever (or Fever >/= 101). What changed: You were already taking a medication with the same name, and this prescription was added. Make sure you understand how and when to take each.   antiseptic oral rinse Liqd Apply 15 mLs topically 2 (two) times daily.   Depakote Sprinkles 125 MG capsule Generic drug: divalproex Take 125 mg by mouth 2 (two) times daily.   Dulcolax 5 MG EC tablet Generic drug: bisacodyl Take 1 tablet (5 mg total) by mouth daily as needed for mild constipation or moderate constipation. What changed:   when to take this  reasons to take this   glycopyrrolate 1 MG tablet Commonly known as: ROBINUL Take 1 tablet (1 mg total) by mouth every 4 (four) hours as needed (excessive secretions).   glycopyrrolate 0.2 MG/ML injection Commonly known as: ROBINUL Inject 1 mL (0.2 mg total) into the vein every 4 (four) hours as needed (excessive secretions).   haloperidol 2 MG tablet Commonly known as: HALDOL Take 1 tablet (2 mg total) by mouth every 4 (four) hours as needed for agitation (or delirium).   haloperidol lactate 5 MG/ML injection Commonly known as: HALDOL Inject 0.4 mLs (2 mg total) into the vein every 4 (four) hours as needed (or delirium).   LORazepam 1 MG  tablet Commonly known as: ATIVAN Take 1 tablet (1 mg total) by mouth every 4 (four) hours as needed for anxiety.   LORazepam 2 MG/ML concentrated solution Commonly known as: ATIVAN Place 0.5 mLs (1 mg total) under the tongue every 4 (four) hours as needed for anxiety.   LORazepam 2 MG/ML injection Commonly known as: ATIVAN Inject 0.5 mLs (1 mg total) into the vein every 4 (four) hours as needed for anxiety.   morphine 4 MG/ML injection Inject 1 mL (4 mg total) into the vein every hour as needed for severe pain or moderate pain.   morphine CONCENTRATE 10 MG/0.5ML Soln concentrated solution Take 0.25 mLs (5 mg total) by mouth every hour as needed for moderate pain (or dyspnea).   morphine CONCENTRATE 10 MG/0.5ML Soln concentrated solution Place 0.25 mLs (5 mg total) under the tongue every hour as needed for moderate pain (or dyspnea).   ondansetron 4 MG tablet Commonly known as: ZOFRAN Take 1 tablet (4 mg total) by mouth every 6 (six) hours as needed for nausea.   ondansetron 4 MG/2ML Soln injection Commonly  known as: ZOFRAN Inject 2 mLs (4 mg total) into the vein every 6 (six) hours as needed for nausea or vomiting.   polyvinyl alcohol 1.4 % ophthalmic solution Commonly known as: LIQUIFILM TEARS Place 1 drop into both eyes 4 (four) times daily as needed for dry eyes.   SEROquel 50 MG tablet Generic drug: QUEtiapine Take 100 mg by mouth at bedtime.   traZODone 50 MG tablet Commonly known as: DESYREL Take 100 mg by mouth at bedtime.       Allergies  Allergen Reactions  . Hylan G-F 20 Anaphylaxis    Other reaction(s): Other (See Comments), Unknown Other reaction(s): Unknown Other reaction(s): Unknown Other reaction(s): Unknown   . Latex Hives  . Other Anaphylaxis and Other (See Comments)    IBAND Arlee Muslim IBAND     Consultations:  Palliative care  General surgery    Procedures/Studies: CT ABDOMEN PELVIS WO CONTRAST  Result Date:  10/06/2020 CLINICAL DATA:  Unspecified abdominal pain.  Vomiting. EXAM: CT ABDOMEN AND PELVIS WITHOUT CONTRAST TECHNIQUE: Multidetector CT imaging of the abdomen and pelvis was performed following the standard protocol without IV contrast. COMPARISON:  None similar and available FINDINGS: Lower chest: Airspace disease in the left lower lobe with superimposed atelectasis and volume loss. Extensive coronary atherosclerosis. Hepatobiliary: No focal liver abnormality.Cholecystectomy. Pancreas: Generalized atrophy Spleen: Unremarkable. Adrenals/Urinary Tract: Negative adrenals. Bilateral nephrectomy. There is history of renal cell carcinoma. Normal size transplant kidney in the left pelvis. Unremarkable bladder. Stomach/Bowel: Very distended stomach with fluid levels and generalized pneumatosis that tracks superiorly along the lower esophagus. Multiple fluid and gas-filled small bowel loops without definite transition point, limited due to streak artifact and lack of oral contrast. Vascular/Lymphatic: Extensive atheromatous calcification. No mass or adenopathy. Reproductive:No pathologic findings. Other: No ascites or pneumoperitoneum. Musculoskeletal: No acute abnormalities. Advanced generalized spondylosis and facet spurring. IMPRESSION: 1. Distended stomach with diffuse pneumatosis tracking superiorly along the esophagus. The pneumatosis could be benign (from distension and vomiting) or an ischemic manifestation. 2. Distended proximal small bowel loops without fluid level, occult obstruction versus gastroenteritis. 3. Left lower lobe airspace disease, presumed aspiration. Electronically Signed   By: Monte Fantasia M.D.   On: 10/06/2020 05:20   DG Chest 1 View  Result Date: 10/14/2020 CLINICAL DATA:  74 year old male NG tube placement. EXAM: CHEST  1 VIEW COMPARISON:  CT Chest, Abdomen, and Pelvis today are reported separately. FINDINGS: Portable AP semi upright view at 0355 hours. Enteric tube courses through  the mediastinum in the midline and terminates at the level of the stomach, but the side hole remains at or just proximal to the gastroesophageal junction (arrow). Abnormal gastric pneumatosis again noted. Stable lung volumes. IMPRESSION: 1. Enteric tube tip within the stomach but side hole likely at or proximal to the GEJ. If side hole placement within the stomach is desired then advance 6 cm. 2. Abnormal gastric pneumatosis as seen by CT. Electronically Signed   By: Genevie Ann M.D.   On: 10/14/2020 04:12   CT CHEST ABDOMEN PELVIS W CONTRAST  Result Date: 10/14/2020 CLINICAL DATA:  Nausea, vomiting. EXAM: CT CHEST, ABDOMEN, AND PELVIS WITH CONTRAST TECHNIQUE: Multidetector CT imaging of the chest, abdomen and pelvis was performed following the standard protocol during bolus administration of intravenous contrast. CONTRAST:  41m OMNIPAQUE IOHEXOL 300 MG/ML  SOLN COMPARISON:  10/06/2020 FINDINGS: CT CHEST FINDINGS Cardiovascular: Coronary artery and aortic calcifications. Heart is normal size. Aorta is normal caliber. Mediastinum/Nodes: No mediastinal, hilar, or axillary adenopathy. Trachea and esophagus  are unremarkable. Thyroid unremarkable. Previously seen pneumatosis around the distal esophagus has decreased. A small amount pneumatosis now present. Lungs/Pleura: Small bilateral pleural effusions. Bibasilar atelectasis. Musculoskeletal: Chest wall soft tissues are unremarkable. No acute bony abnormality. CT ABDOMEN PELVIS FINDINGS Hepatobiliary: Extensive portal venous gas throughout the liver. No focal hepatic abnormality. Prior cholecystectomy. Pancreas: No focal abnormality or ductal dilatation. Spleen: No focal abnormality.  Normal size. Adrenals/Urinary Tract: Bilateral nephrectomy. Adrenal glands unremarkable. Left lower quadrant renal transplant. No hydronephrosis. Urinary bladder unremarkable. Stomach/Bowel: There is pneumatosis throughout the wall of the stomach which is markedly distended with gas and  fluid. Large and small bowel loops grossly unremarkable. Mildly prominent scattered small bowel loops without transition, wall thickening or pneumatosis. Favor ileus. Vascular/Lymphatic: Heavily calcified aorta. No evidence of aneurysm or adenopathy. Reproductive: No visible focal abnormality. Other: Moderate amount of free fluid in the left abdomen and pelvis. No free air. Musculoskeletal: No acute bony abnormality. IMPRESSION: Extensive gastric pneumatosis which also extends superiorly along the distal esophagus. Associated extensive portal venous gas throughout the liver. Findings concerning for ischemic bowel. Stomach is distended with gas and fluid/debris. Moderate free fluid in the left abdomen and pelvis. Small bilateral pleural effusions, bibasilar atelectasis. Coronary artery disease, aortic atherosclerosis. Critical Value/emergent results were called by telephone at the time of interpretation on 10/14/2020 at 1:49 am to provider [facility] , who verbally acknowledged these results. Electronically Signed   By: Rolm Baptise M.D.   On: 10/14/2020 01:51   DG Chest Port 1 View  Result Date: 10/13/2020 CLINICAL DATA:  74 year old male with concern for sepsis. EXAM: PORTABLE CHEST 1 VIEW COMPARISON:  Chest radiograph dated 08/13/2018. FINDINGS: Mild diffuse interstitial prominence, likely atelectasis. No focal consolidation, pleural effusion, pneumothorax. The cardiac silhouette is within limits. No acute osseous pathology. Air under the left hemidiaphragm, likely within the stomach. IMPRESSION: No acute cardiopulmonary process. Electronically Signed   By: Anner Crete M.D.   On: 10/13/2020 23:34   DG Abd 2 Views  Result Date: 10/08/2020 CLINICAL DATA:  Small bowel obstruction. EXAM: ABDOMEN - 2 VIEW COMPARISON:  October 07, 2020.  October 06, 2020. FINDINGS: Continued presence of gastric pneumatosis. No abnormal bowel dilatation is noted. Status post cholecystectomy. Surgical clips are seen in  the right side of the abdomen and right lower quadrant. IMPRESSION: Continued presence of gastric pneumatosis. No abnormal bowel dilatation is noted. Electronically Signed   By: Marijo Conception M.D.   On: 10/08/2020 08:28   DG Abd 2 Views  Result Date: 10/07/2020 CLINICAL DATA:  Small-bowel obstruction. EXAM: ABDOMEN - 2 VIEW COMPARISON:  CT 10/06/2020. FINDINGS: Surgical clips noted over the abdomen. Gastric distention and gastric pneumatosis again noted. Previously identified small bowel distention noted on CT of 10/06/2020 not identified on this standard abdomen series. Follow-up CT can be obtained as needed. No free air identified. Degenerative change lumbar spine and both hips. IMPRESSION: Gastric distention and gastric pneumatosis again noted. Previously identified small bowel distention noted on CT of 10/06/2020 not identified on this standard abdomen series. Follow-up CT can be obtained as needed. Electronically Signed   By: Marcello Moores  Register   On: 10/07/2020 07:03       Subjective: Pt seen sleeping comfortably.  Sister at bedside, has been here about two hours.  Reports patient comfortable and seems in no pain or discomfort.  She says he opened his eyes briefly a couple of times.   Discharge Exam: Vitals:   10/14/20 2127 10/15/20 1001  BP: Marland Kitchen)  154/109 (!) 155/86  Pulse: 77 66  Resp: 20 17  Temp: 97.9 F (36.6 C) (!) 97.5 F (36.4 C)  SpO2: 100% 94%   Vitals:   10/14/20 2000 10/14/20 2127 10/14/20 2130 10/15/20 1001  BP: (!) 135/96 (!) 154/109  (!) 155/86  Pulse: 69 77  66  Resp: 16 20  17   Temp: 97.9 F (36.6 C) 97.9 F (36.6 C)  (!) 97.5 F (36.4 C)  TempSrc: Axillary   Oral  SpO2: 100% 100%  94%  Height:   6' (1.829 m)     General: sleeping comfortably, not in acute distress, chronically ill appearing,  Cardiovascular: RRR, S1/S2 +, no rubs, no gallops Respiratory: CTA bilaterally, no respiratory distress Abdominal: Soft, NT Extremities: no edema, no cyanosis,  severe muscle atrophy of b/l LE's    The results of significant diagnostics from this hospitalization (including imaging, microbiology, ancillary and laboratory) are listed below for reference.     Microbiology: Recent Results (from the past 240 hour(s))  Blood culture (routine x 2)     Status: None   Collection Time: 10/06/20  7:55 AM   Specimen: BLOOD  Result Value Ref Range Status   Specimen Description BLOOD BLOOD RIGHT HAND  Final   Special Requests   Final    BOTTLES DRAWN AEROBIC AND ANAEROBIC Blood Culture adequate volume   Culture   Final    NO GROWTH 5 DAYS Performed at [facility], [address] [place], [place] [address]    Report Status 10/11/2020 FINAL  Final  Blood culture (routine x 2)     Status: None   Collection Time: 10/06/20  8:02 AM   Specimen: BLOOD  Result Value Ref Range Status   Specimen Description BLOOD BLOOD RIGHT ARM  Final   Special Requests   Final    BOTTLES DRAWN AEROBIC AND ANAEROBIC Blood Culture adequate volume   Culture   Final    NO GROWTH 5 DAYS Performed at [facility], [address] [place], [place] [address]    Report Status 10/11/2020 FINAL  Final  Urine Culture     Status: None   Collection Time: 10/06/20 10:43 AM   Specimen: Urine, Random  Result Value Ref Range Status   Specimen Description   Final    URINE, RANDOM Performed at [facility], [address], [place], [place] [address]    Special Requests   Final    NONE Performed at [facility], [address], [place], [place] [address]    Culture   Final    NO GROWTH Performed at [facility], [place] [address] [place], [place] [address]    Report Status 10/08/2020 FINAL  Final  Resp Panel by RT-PCR (Flu A&B, Covid) Nasopharyngeal Swab     Status: None   Collection Time: 10/06/20  4:27 PM   Specimen: Nasopharyngeal Swab; Nasopharyngeal(NP) swabs in vial transport medium  Result Value Ref Range Status   SARS  Coronavirus 2 by RT PCR NEGATIVE NEGATIVE Final    Comment: (NOTE) SARS-CoV-2 target nucleic acids are NOT DETECTED.  The SARS-CoV-2 RNA is generally detectable in upper respiratory specimens during the acute phase of infection. The lowest concentration of SARS-CoV-2 viral copies this assay can detect is 138 copies/mL. A negative result does not preclude SARS-Cov-2 infection and should not be used as the sole basis for treatment or other patient management decisions. A negative result may occur with  improper specimen collection/handling, submission of  specimen other than nasopharyngeal swab, presence of viral mutation(s) within the areas targeted by this assay, and inadequate number of viral copies(<138 copies/mL). A negative result must be combined with clinical observations, patient history, and epidemiological information. The expected result is Negative.  Fact Sheet for Patients:  EntrepreneurPulse.com.au  Fact Sheet for Healthcare Providers:  IncredibleEmployment.be  This test is no t yet approved or cleared by the [place] FDA and  has been authorized for detection and/or diagnosis of SARS-CoV-2 by FDA under an Emergency Use Authorization (EUA). This EUA will remain  in effect (meaning this test can be used) for the duration of the COVID-19 declaration under Section 564(b)(1) of the Act, 21 U.S.C.section 360bbb-3(b)(1), unless the authorization is terminated  or revoked sooner.       Influenza A by PCR NEGATIVE NEGATIVE Final   Influenza B by PCR NEGATIVE NEGATIVE Final    Comment: (NOTE) The Xpert Xpress SARS-CoV-2/FLU/RSV plus assay is intended as an aid in the diagnosis of influenza from Nasopharyngeal swab specimens and should not be used as a sole basis for treatment. Nasal washings and aspirates are unacceptable for Xpert Xpress SARS-CoV-2/FLU/RSV testing.  Fact Sheet for  Patients: EntrepreneurPulse.com.au  Fact Sheet for Healthcare Providers: IncredibleEmployment.be  This test is not yet approved or cleared by the [place] FDA and has been authorized for detection and/or diagnosis of SARS-CoV-2 by FDA under an Emergency Use Authorization (EUA). This EUA will remain in effect (meaning this test can be used) for the duration of the COVID-19 declaration under Section 564(b)(1) of the Act, 21 U.S.C. section 360bbb-3(b)(1), unless the authorization is terminated or revoked.  Performed at [facility], [address] [place] [place],  [address]   Resp Panel by RT-PCR (Flu A&B, Covid) Nasopharyngeal Swab     Status: None   Collection Time: 10/11/20  9:22 AM   Specimen: Nasopharyngeal Swab; Nasopharyngeal(NP) swabs in vial transport medium  Result Value Ref Range Status   SARS Coronavirus 2 by RT PCR NEGATIVE NEGATIVE Final    Comment: (NOTE) SARS-CoV-2 target nucleic acids are NOT DETECTED.  The SARS-CoV-2 RNA is generally detectable in upper respiratory specimens during the acute phase of infection. The lowest concentration of SARS-CoV-2 viral copies this assay can detect is 138 copies/mL. A negative result does not preclude SARS-Cov-2 infection and should not be used as the sole basis for treatment or other patient management decisions. A negative result may occur with  improper specimen collection/handling, submission of specimen other than nasopharyngeal swab, presence of viral mutation(s) within the areas targeted by this assay, and inadequate number of viral copies(<138 copies/mL). A negative result must be combined with clinical observations, patient history, and epidemiological information. The expected result is Negative.  Fact Sheet for Patients:  EntrepreneurPulse.com.au  Fact Sheet for Healthcare Providers:  IncredibleEmployment.be  This test is no t  yet approved or cleared by the [place] FDA and  has been authorized for detection and/or diagnosis of SARS-CoV-2 by FDA under an Emergency Use Authorization (EUA). This EUA will remain  in effect (meaning this test can be used) for the duration of the COVID-19 declaration under Section 564(b)(1) of the Act, 21 U.S.C.section 360bbb-3(b)(1), unless the authorization is terminated  or revoked sooner.       Influenza A by PCR NEGATIVE NEGATIVE Final   Influenza B by PCR NEGATIVE NEGATIVE Final    Comment: (NOTE) The Xpert Xpress SARS-CoV-2/FLU/RSV plus assay is intended as an aid in the diagnosis of influenza from Nasopharyngeal  swab specimens and should not be used as a sole basis for treatment. Nasal washings and aspirates are unacceptable for Xpert Xpress SARS-CoV-2/FLU/RSV testing.  Fact Sheet for Patients: EntrepreneurPulse.com.au  Fact Sheet for Healthcare Providers: IncredibleEmployment.be  This test is not yet approved or cleared by the [place] FDA and has been authorized for detection and/or diagnosis of SARS-CoV-2 by FDA under an Emergency Use Authorization (EUA). This EUA will remain in effect (meaning this test can be used) for the duration of the COVID-19 declaration under Section 564(b)(1) of the Act, 21 U.S.C. section 360bbb-3(b)(1), unless the authorization is terminated or revoked.  Performed at [facility], [address] [place], [place] [address]   Urine culture     Status: Abnormal (Preliminary result)   Collection Time: 10/13/20 11:59 PM   Specimen: In/Out Cath Urine  Result Value Ref Range Status   Specimen Description   Final    IN/OUT CATH URINE Performed at [facility], [address] [place] [address], [place], [place] [address]    Special Requests   Final    NONE Performed at [facility], [address] [place], [place] [address]    Culture (A)  Final    20,000 COLONIES/mL  ESCHERICHIA COLI SUSCEPTIBILITIES TO FOLLOW Performed at [facility], [place] [address], [place], [place] [address]    Report Status PENDING  Incomplete  Blood culture (routine single)     Status: None (Preliminary result)   Collection Time: 10/14/20 12:01 AM   Specimen: BLOOD  Result Value Ref Range Status   Specimen Description BLOOD RIGHT ASSIST CONTROL  Final   Special Requests   Final    BOTTLES DRAWN AEROBIC AND ANAEROBIC Blood Culture adequate volume   Culture   Final    NO GROWTH 1 DAY Performed at [facility], [address], [place], [place] [address]    Report Status PENDING  Incomplete  Resp Panel by RT-PCR (Flu A&B, Covid) Nasopharyngeal Swab     Status: None   Collection Time: 10/14/20  5:43 AM   Specimen: Nasopharyngeal Swab; Nasopharyngeal(NP) swabs in vial transport medium  Result Value Ref Range Status   SARS Coronavirus 2 by RT PCR NEGATIVE NEGATIVE Final    Comment: (NOTE) SARS-CoV-2 target nucleic acids are NOT DETECTED.  The SARS-CoV-2 RNA is generally detectable in upper respiratory specimens during the acute phase of infection. The lowest concentration of SARS-CoV-2 viral copies this assay can detect is 138 copies/mL. A negative result does not preclude SARS-Cov-2 infection and should not be used as the sole basis for treatment or other patient management decisions. A negative result may occur with  improper specimen collection/handling, submission of specimen other than nasopharyngeal swab, presence of viral mutation(s) within the areas targeted by this assay, and inadequate number of viral copies(<138 copies/mL). A negative result must be combined with clinical observations, patient history, and epidemiological information. The expected result is Negative.  Fact Sheet for Patients:  EntrepreneurPulse.com.au  Fact Sheet for Healthcare Providers:  IncredibleEmployment.be  This test is no t yet  approved or cleared by the [place] FDA and  has been authorized for detection and/or diagnosis of SARS-CoV-2 by FDA under an Emergency Use Authorization (EUA). This EUA will remain  in effect (meaning this test can be used) for the duration of the COVID-19 declaration under Section 564(b)(1) of the Act, 21 U.S.C.section 360bbb-3(b)(1), unless the authorization is terminated  or revoked sooner.       Influenza A by PCR  NEGATIVE NEGATIVE Final   Influenza B by PCR NEGATIVE NEGATIVE Final    Comment: (NOTE) The Xpert Xpress SARS-CoV-2/FLU/RSV plus assay is intended as an aid in the diagnosis of influenza from Nasopharyngeal swab specimens and should not be used as a sole basis for treatment. Nasal washings and aspirates are unacceptable for Xpert Xpress SARS-CoV-2/FLU/RSV testing.  Fact Sheet for Patients: EntrepreneurPulse.com.au  Fact Sheet for Healthcare Providers: IncredibleEmployment.be  This test is not yet approved or cleared by the [place] FDA and has been authorized for detection and/or diagnosis of SARS-CoV-2 by FDA under an Emergency Use Authorization (EUA). This EUA will remain in effect (meaning this test can be used) for the duration of the COVID-19 declaration under Section 564(b)(1) of the Act, 21 U.S.C. section 360bbb-3(b)(1), unless the authorization is terminated or revoked.  Performed at [facility], [address] [place], [place] [address]      Labs: BNP (last 3 results) No results for input(s): BNP in the last 8760 hours. Basic Metabolic Panel: Recent Labs  Lab 10/09/20 0655 10/13/20 2359  NA 144 134*  K 3.7 3.9  CL 109 100  CO2 26 25  GLUCOSE 133* 258*  BUN 29* 18  CREATININE 0.91 0.88  CALCIUM 8.9 9.0   Liver Function Tests: Recent Labs  Lab 10/13/20 2359  AST 15  ALT 13  ALKPHOS 54  BILITOT 0.5  PROT 6.7  ALBUMIN 3.1*   Recent Labs  Lab 10/13/20 2359  LIPASE 34   No  results for input(s): AMMONIA in the last 168 hours. CBC: Recent Labs  Lab 10/09/20 0655 10/13/20 2359  WBC 6.7 7.1  NEUTROABS  --  5.4  HGB 10.2* 12.5*  HCT 33.2* 40.0  MCV 84.5 83.2  PLT 237 197   Cardiac Enzymes: No results for input(s): CKTOTAL, CKMB, CKMBINDEX, TROPONINI in the last 168 hours. BNP: Invalid input(s): POCBNP CBG: Recent Labs  Lab 10/10/20 1711 10/10/20 2041 10/10/20 2329 10/11/20 0343 10/11/20 0805  GLUCAP 177* 114* 162* 144* 114*   D-Dimer No results for input(s): DDIMER in the last 72 hours. Hgb A1c No results for input(s): HGBA1C in the last 72 hours. Lipid Profile No results for input(s): CHOL, HDL, LDLCALC, TRIG, CHOLHDL, LDLDIRECT in the last 72 hours. Thyroid function studies No results for input(s): TSH, T4TOTAL, T3FREE, THYROIDAB in the last 72 hours.  Invalid input(s): FREET3 Anemia work up No results for input(s): VITAMINB12, FOLATE, FERRITIN, TIBC, IRON, RETICCTPCT in the last 72 hours. Urinalysis    Component Value Date/Time   COLORURINE YELLOW (A) 10/13/2020 2359   APPEARANCEUR HAZY (A) 10/13/2020 2359   LABSPEC 1.018 10/13/2020 2359   PHURINE 5.0 10/13/2020 2359   GLUCOSEU >=500 (A) 10/13/2020 2359   HGBUR SMALL (A) 10/13/2020 2359   BILIRUBINUR NEGATIVE 10/13/2020 2359   [place] 10/13/2020 2359   PROTEINUR >=300 (A) 10/13/2020 2359   NITRITE NEGATIVE 10/13/2020 2359   LEUKOCYTESUR MODERATE (A) 10/13/2020 2359   Sepsis Labs Invalid input(s): PROCALCITONIN,  WBC,  LACTICIDVEN Microbiology Recent Results (from the past 240 hour(s))  Blood culture (routine x 2)     Status: None   Collection Time: 10/06/20  7:55 AM   Specimen: BLOOD  Result Value Ref Range Status   Specimen Description BLOOD BLOOD RIGHT HAND  Final   Special Requests   Final    BOTTLES DRAWN AEROBIC AND ANAEROBIC Blood Culture adequate volume   Culture   Final    NO GROWTH 5 DAYS Performed at [facility], [address]  [place], [place] [address]    Report Status 10/11/2020 FINAL  Final  Blood culture (routine x 2)     Status: None   Collection Time: 10/06/20  8:02 AM   Specimen: BLOOD  Result Value Ref Range Status   Specimen Description BLOOD BLOOD RIGHT ARM  Final   Special Requests   Final    BOTTLES DRAWN AEROBIC AND ANAEROBIC Blood Culture adequate volume   Culture   Final    NO GROWTH 5 DAYS Performed at [facility], [address] [place], [place] [address]    Report Status 10/11/2020 FINAL  Final  Urine Culture     Status: None   Collection Time: 10/06/20 10:43 AM   Specimen: Urine, Random  Result Value Ref Range Status   Specimen Description   Final    URINE, RANDOM Performed at [facility], [address], [place], [place] [address]    Special Requests   Final    NONE Performed at [facility], [address] [place], [place] [address]    Culture   Final    NO GROWTH Performed at [facility], [place] [address], [place], [place] [address]    Report Status 10/08/2020 FINAL  Final  Resp Panel by RT-PCR (Flu A&B, Covid) Nasopharyngeal Swab     Status: None   Collection Time: 10/06/20  4:27 PM   Specimen: Nasopharyngeal Swab; Nasopharyngeal(NP) swabs in vial transport medium  Result Value Ref Range Status   SARS Coronavirus 2 by RT PCR NEGATIVE NEGATIVE Final    Comment: (NOTE) SARS-CoV-2 target nucleic acids are NOT DETECTED.  The SARS-CoV-2 RNA is generally detectable in upper respiratory specimens during the acute phase of infection. The lowest concentration of SARS-CoV-2 viral copies this assay can detect is 138 copies/mL. A negative result does not preclude SARS-Cov-2 infection and should not be used as the sole basis for treatment or other patient management decisions. A negative result may occur with  improper specimen collection/handling, submission of specimen other than nasopharyngeal swab, presence of viral mutation(s) within the areas  targeted by this assay, and inadequate number of viral copies(<138 copies/mL). A negative result must be combined with clinical observations, patient history, and epidemiological information. The expected result is Negative.  Fact Sheet for Patients:  EntrepreneurPulse.com.au  Fact Sheet for Healthcare Providers:  IncredibleEmployment.be  This test is no t yet approved or cleared by the [place] FDA and  has been authorized for detection and/or diagnosis of SARS-CoV-2 by FDA under an Emergency Use Authorization (EUA). This EUA will remain  in effect (meaning this test can be used) for the duration of the COVID-19 declaration under Section 564(b)(1) of the Act, 21 U.S.C.section 360bbb-3(b)(1), unless the authorization is terminated  or revoked sooner.       Influenza A by PCR NEGATIVE NEGATIVE Final   Influenza B by PCR NEGATIVE NEGATIVE Final    Comment: (NOTE) The Xpert Xpress SARS-CoV-2/FLU/RSV plus assay is intended as an aid in the diagnosis of influenza from Nasopharyngeal swab specimens and should not be used as a sole basis for treatment. Nasal washings and aspirates are unacceptable for Xpert Xpress SARS-CoV-2/FLU/RSV testing.  Fact Sheet for Patients: EntrepreneurPulse.com.au  Fact Sheet for Healthcare Providers: IncredibleEmployment.be  This test is not yet approved or cleared by the [place] FDA and has been authorized for detection and/or diagnosis of SARS-CoV-2 by FDA under an Emergency Use Authorization (EUA). This EUA will remain  in effect (meaning this test can be used) for the duration of the COVID-19 declaration under Section 564(b)(1) of the Act, 21 U.S.C. section 360bbb-3(b)(1), unless the authorization is terminated or revoked.  Performed at [facility], [address] [place], [place] [address]   Resp Panel by RT-PCR (Flu A&B, Covid) Nasopharyngeal Swab      Status: None   Collection Time: 10/11/20  9:22 AM   Specimen: Nasopharyngeal Swab; Nasopharyngeal(NP) swabs in vial transport medium  Result Value Ref Range Status   SARS Coronavirus 2 by RT PCR NEGATIVE NEGATIVE Final    Comment: (NOTE) SARS-CoV-2 target nucleic acids are NOT DETECTED.  The SARS-CoV-2 RNA is generally detectable in upper respiratory specimens during the acute phase of infection. The lowest concentration of SARS-CoV-2 viral copies this assay can detect is 138 copies/mL. A negative result does not preclude SARS-Cov-2 infection and should not be used as the sole basis for treatment or other patient management decisions. A negative result may occur with  improper specimen collection/handling, submission of specimen other than nasopharyngeal swab, presence of viral mutation(s) within the areas targeted by this assay, and inadequate number of viral copies(<138 copies/mL). A negative result must be combined with clinical observations, patient history, and epidemiological information. The expected result is Negative.  Fact Sheet for Patients:  EntrepreneurPulse.com.au  Fact Sheet for Healthcare Providers:  IncredibleEmployment.be  This test is no t yet approved or cleared by the [place] FDA and  has been authorized for detection and/or diagnosis of SARS-CoV-2 by FDA under an Emergency Use Authorization (EUA). This EUA will remain  in effect (meaning this test can be used) for the duration of the COVID-19 declaration under Section 564(b)(1) of the Act, 21 U.S.C.section 360bbb-3(b)(1), unless the authorization is terminated  or revoked sooner.       Influenza A by PCR NEGATIVE NEGATIVE Final   Influenza B by PCR NEGATIVE NEGATIVE Final    Comment: (NOTE) The Xpert Xpress SARS-CoV-2/FLU/RSV plus assay is intended as an aid in the diagnosis of influenza from Nasopharyngeal swab specimens and should not be used as a sole basis  for treatment. Nasal washings and aspirates are unacceptable for Xpert Xpress SARS-CoV-2/FLU/RSV testing.  Fact Sheet for Patients: EntrepreneurPulse.com.au  Fact Sheet for Healthcare Providers: IncredibleEmployment.be  This test is not yet approved or cleared by the [place] FDA and has been authorized for detection and/or diagnosis of SARS-CoV-2 by FDA under an Emergency Use Authorization (EUA). This EUA will remain in effect (meaning this test can be used) for the duration of the COVID-19 declaration under Section 564(b)(1) of the Act, 21 U.S.C. section 360bbb-3(b)(1), unless the authorization is terminated or revoked.  Performed at [facility], [address], [place], [place] [address]   Urine culture     Status: Abnormal (Preliminary result)   Collection Time: 10/13/20 11:59 PM   Specimen: In/Out Cath Urine  Result Value Ref Range Status   Specimen Description   Final    IN/OUT CATH URINE Performed at [facility] [address] [place], [place] [address]    Special Requests   Final    NONE Performed at [facility], [address] [place], [place] [address]    Culture (A)  Final    20,000 COLONIES/mL ESCHERICHIA COLI SUSCEPTIBILITIES TO FOLLOW Performed at [facility], [place] [address] [place], [place] [address]    Report Status PENDING  Incomplete  Blood culture (routine single)     Status: None (  Preliminary result)   Collection Time: 10/14/20 12:01 AM   Specimen: BLOOD  Result Value Ref Range Status   Specimen Description BLOOD RIGHT ASSIST CONTROL  Final   Special Requests   Final    BOTTLES DRAWN AEROBIC AND ANAEROBIC Blood Culture adequate volume   Culture   Final    NO GROWTH 1 DAY Performed at [facility], [address] [place], [place] [address]    Report Status PENDING  Incomplete  Resp Panel by RT-PCR (Flu A&B, Covid) Nasopharyngeal Swab     Status: None    Collection Time: 10/14/20  5:43 AM   Specimen: Nasopharyngeal Swab; Nasopharyngeal(NP) swabs in vial transport medium  Result Value Ref Range Status   SARS Coronavirus 2 by RT PCR NEGATIVE NEGATIVE Final    Comment: (NOTE) SARS-CoV-2 target nucleic acids are NOT DETECTED.  The SARS-CoV-2 RNA is generally detectable in upper respiratory specimens during the acute phase of infection. The lowest concentration of SARS-CoV-2 viral copies this assay can detect is 138 copies/mL. A negative result does not preclude SARS-Cov-2 infection and should not be used as the sole basis for treatment or other patient management decisions. A negative result may occur with  improper specimen collection/handling, submission of specimen other than nasopharyngeal swab, presence of viral mutation(s) within the areas targeted by this assay, and inadequate number of viral copies(<138 copies/mL). A negative result must be combined with clinical observations, patient history, and epidemiological information. The expected result is Negative.  Fact Sheet for Patients:  EntrepreneurPulse.com.au  Fact Sheet for Healthcare Providers:  IncredibleEmployment.be  This test is no t yet approved or cleared by the [place] FDA and  has been authorized for detection and/or diagnosis of SARS-CoV-2 by FDA under an Emergency Use Authorization (EUA). This EUA will remain  in effect (meaning this test can be used) for the duration of the COVID-19 declaration under Section 564(b)(1) of the Act, 21 U.S.C.section 360bbb-3(b)(1), unless the authorization is terminated  or revoked sooner.       Influenza A by PCR NEGATIVE NEGATIVE Final   Influenza B by PCR NEGATIVE NEGATIVE Final    Comment: (NOTE) The Xpert Xpress SARS-CoV-2/FLU/RSV plus assay is intended as an aid in the diagnosis of influenza from Nasopharyngeal swab specimens and should not be used as a sole basis for treatment.  Nasal washings and aspirates are unacceptable for Xpert Xpress SARS-CoV-2/FLU/RSV testing.  Fact Sheet for Patients: EntrepreneurPulse.com.au  Fact Sheet for Healthcare Providers: IncredibleEmployment.be  This test is not yet approved or cleared by the [place] FDA and has been authorized for detection and/or diagnosis of SARS-CoV-2 by FDA under an Emergency Use Authorization (EUA). This EUA will remain in effect (meaning this test can be used) for the duration of the COVID-19 declaration under Section 564(b)(1) of the Act, 21 U.S.C. section 360bbb-3(b)(1), unless the authorization is terminated or revoked.  Performed at [facility], [address] [place], [place] [address]      Time coordinating discharge: Over 30 minutes  SIGNED:   [name], DO [facility] 10/15/2020, 12:41 PM   If 7PM-7AM, please contact night-coverage [email or web address]

## 2020-10-16 LAB — URINE CULTURE: Culture: 20000 — AB

## 2020-10-19 LAB — CULTURE, BLOOD (SINGLE)
Culture: NO GROWTH
Special Requests: ADEQUATE

## 2020-10-29 DEATH — deceased

## 2021-08-06 IMAGING — CR DG SHOULDER 2+V*R*
3 series · 3 of 3 positions shown · non-contrast
Comparison: None.

CLINICAL DATA: Worsening right shoulder pain.

EXAM:
RIGHT SHOULDER - 2+ VIEW

[shoulder grashey]
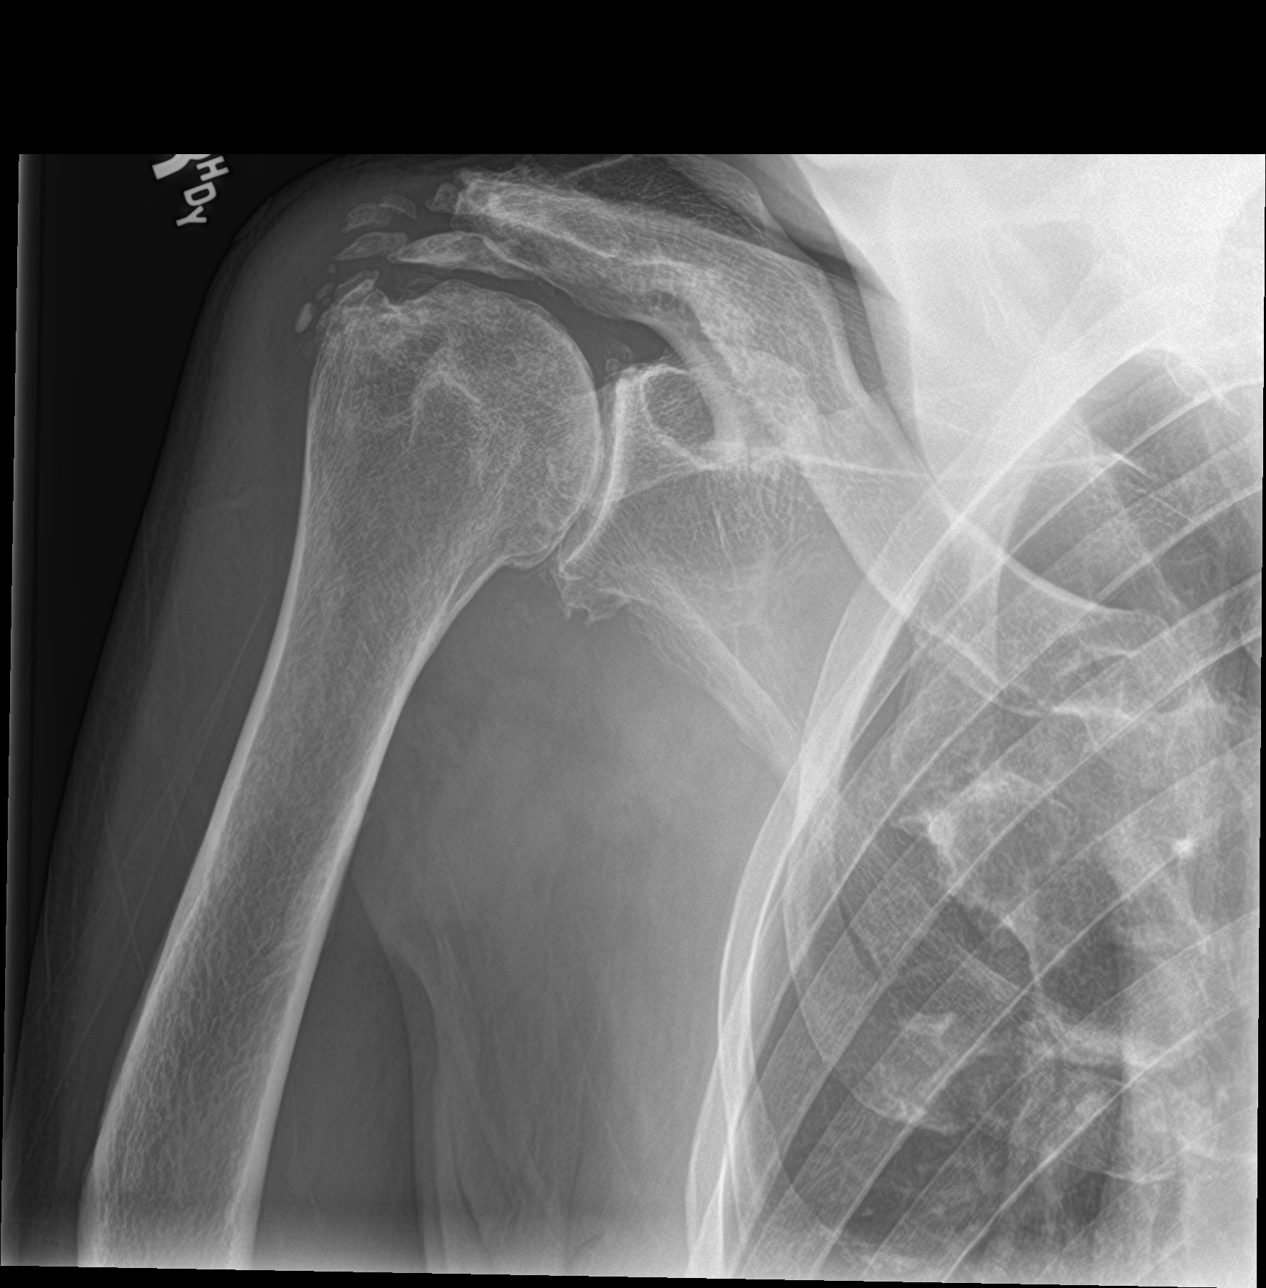

[shoulder y view]
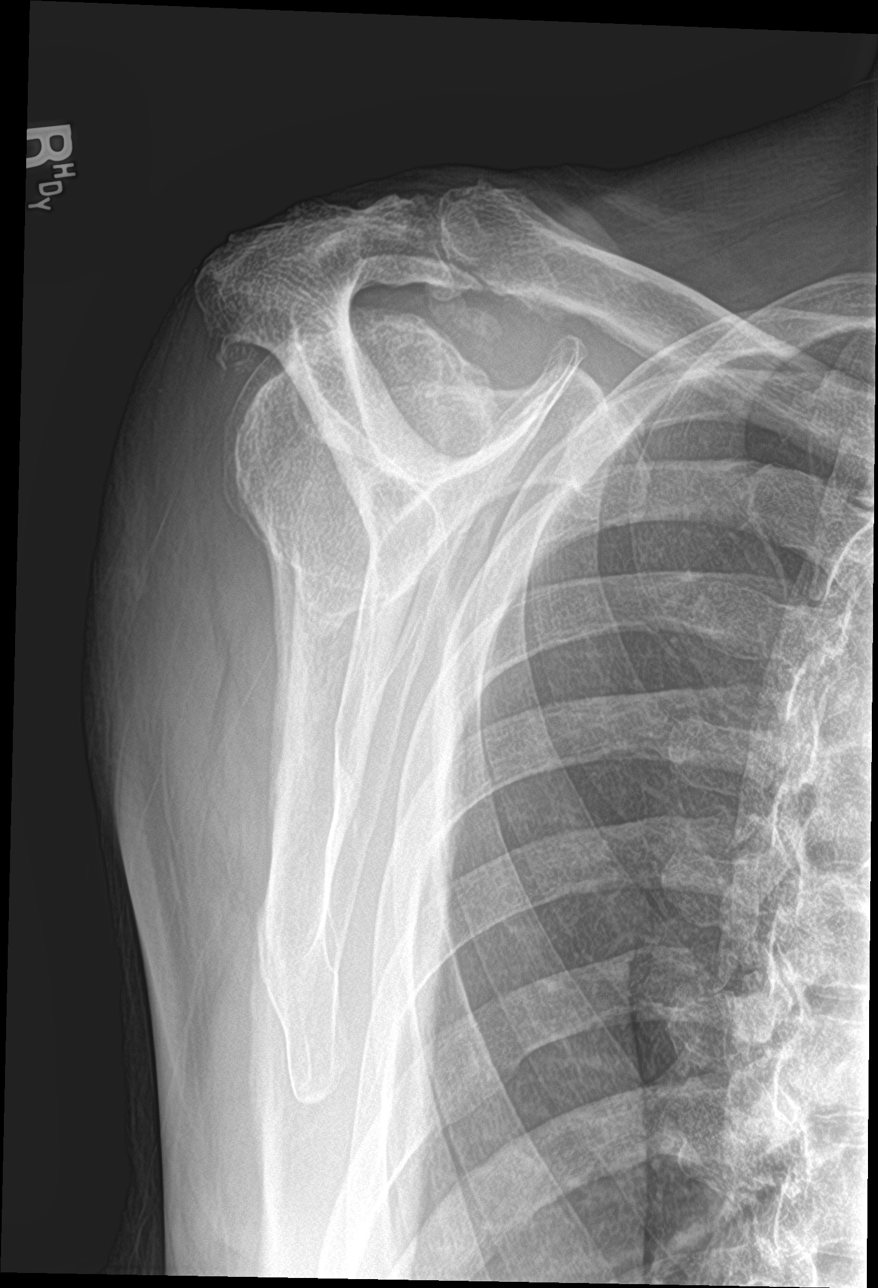

[shoulder ap neutral]
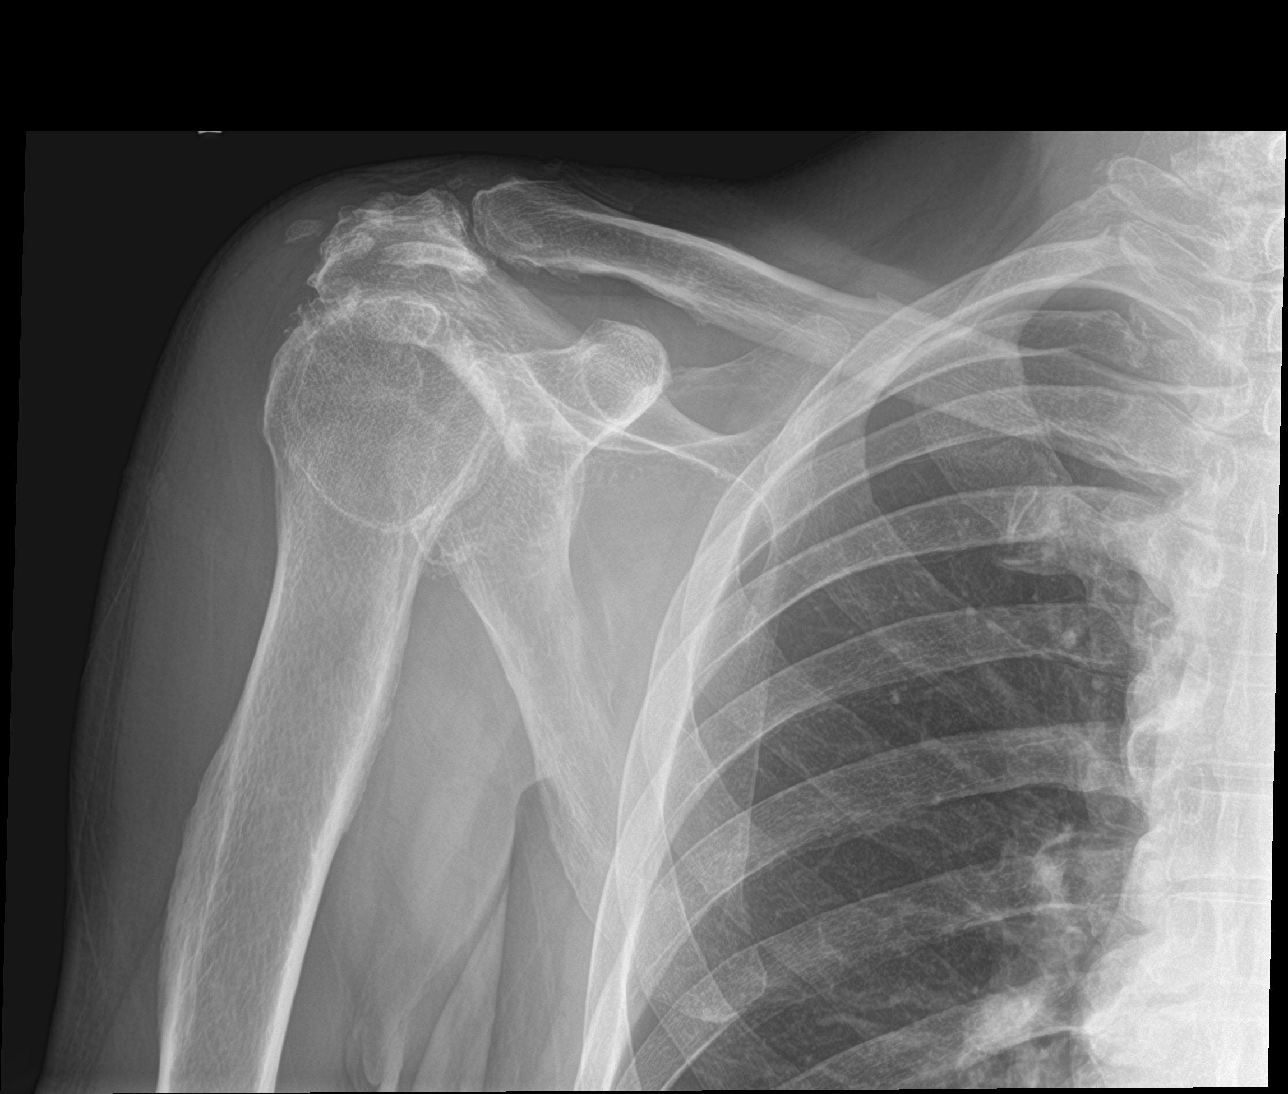

[3 of 3 positions shown; findings below may reference images not displayed]

FINDINGS: There is no evidence of fracture or dislocation. Advanced
osteoarthritic changes at the glenohumeral joint with joint space
narrowing subchondral sclerosis and osteophytosis. Mild
osteoarthritic changes at the acromioclavicular joint. Well
corticated calcific fragments superior to the humeral head may
represent sequela of prior trauma or calcific tendinitis of the
rotator cuff.
IMPRESSION: Advanced osteoarthritic changes at the glenohumeral joint.
